# Patient Record
Sex: Female | Born: 1965 | Race: White | Hispanic: No | Marital: Single | Smoking: Never smoker
Health system: Southern US, Community
[De-identification: ages and names within clinical notes are randomized; demographics above are authoritative.]

## PROBLEM LIST (undated history)

## (undated) DIAGNOSIS — F329 Major depressive disorder, single episode, unspecified: Secondary | ICD-10-CM

## (undated) DIAGNOSIS — I10 Essential (primary) hypertension: Secondary | ICD-10-CM

## (undated) DIAGNOSIS — D696 Thrombocytopenia, unspecified: Secondary | ICD-10-CM

## (undated) DIAGNOSIS — F32A Depression, unspecified: Secondary | ICD-10-CM

## (undated) DIAGNOSIS — F419 Anxiety disorder, unspecified: Secondary | ICD-10-CM

## (undated) DIAGNOSIS — M1990 Unspecified osteoarthritis, unspecified site: Secondary | ICD-10-CM

## (undated) DIAGNOSIS — Z5189 Encounter for other specified aftercare: Secondary | ICD-10-CM

## (undated) HISTORY — PX: WRIST SURGERY: SHX841

## (undated) HISTORY — PX: ANKLE SURGERY: SHX546

## (undated) HISTORY — DX: Encounter for other specified aftercare: Z51.89

## (undated) HISTORY — DX: Essential (primary) hypertension: I10

## (undated) HISTORY — DX: Major depressive disorder, single episode, unspecified: F32.9

## (undated) HISTORY — PX: KNEE ARTHROSCOPY: SUR90

## (undated) HISTORY — DX: Depression, unspecified: F32.A

## (undated) HISTORY — PX: PITUITARY SURGERY: SHX203

## (undated) HISTORY — DX: Thrombocytopenia, unspecified: D69.6

## (undated) HISTORY — PX: TONSILLECTOMY: SUR1361

## (undated) HISTORY — DX: Unspecified osteoarthritis, unspecified site: M19.90

## (undated) HISTORY — PX: OVARY SURGERY: SHX727

## (undated) HISTORY — PX: OTHER SURGICAL HISTORY: SHX169

## (undated) HISTORY — DX: Anxiety disorder, unspecified: F41.9

---

## 1997-07-13 ENCOUNTER — Other Ambulatory Visit: Admission: RE | Admit: 1997-07-13 | Discharge: 1997-07-13 | Payer: Self-pay | Admitting: Obstetrics and Gynecology

## 1998-01-11 ENCOUNTER — Other Ambulatory Visit: Admission: RE | Admit: 1998-01-11 | Discharge: 1998-01-11 | Payer: Self-pay | Admitting: Obstetrics and Gynecology

## 2000-09-21 ENCOUNTER — Ambulatory Visit (HOSPITAL_COMMUNITY): Admission: RE | Admit: 2000-09-21 | Discharge: 2000-09-21 | Payer: Self-pay | Admitting: Endocrinology

## 2000-09-21 ENCOUNTER — Encounter: Payer: Self-pay | Admitting: Endocrinology

## 2001-09-12 ENCOUNTER — Encounter: Payer: Self-pay | Admitting: Endocrinology

## 2001-09-12 ENCOUNTER — Ambulatory Visit (HOSPITAL_COMMUNITY): Admission: RE | Admit: 2001-09-12 | Discharge: 2001-09-12 | Payer: Self-pay | Admitting: Endocrinology

## 2001-11-17 ENCOUNTER — Inpatient Hospital Stay (HOSPITAL_COMMUNITY): Admission: RE | Admit: 2001-11-17 | Discharge: 2001-11-23 | Payer: Self-pay | Admitting: Neurosurgery

## 2001-11-17 ENCOUNTER — Encounter: Payer: Self-pay | Admitting: Neurosurgery

## 2010-07-26 ENCOUNTER — Ambulatory Visit (HOSPITAL_BASED_OUTPATIENT_CLINIC_OR_DEPARTMENT_OTHER)
Admission: RE | Admit: 2010-07-26 | Discharge: 2010-07-26 | Disposition: A | Discharge status: Home / Self Care | Payer: Medicare Other | Source: Ambulatory Visit | Attending: Orthopedic Surgery | Admitting: Orthopedic Surgery

## 2010-07-26 DIAGNOSIS — M1710 Unilateral primary osteoarthritis, unspecified knee: Secondary | ICD-10-CM | POA: Insufficient documentation

## 2010-07-26 DIAGNOSIS — Z01810 Encounter for preprocedural cardiovascular examination: Secondary | ICD-10-CM | POA: Insufficient documentation

## 2010-07-26 DIAGNOSIS — IMO0002 Reserved for concepts with insufficient information to code with codable children: Secondary | ICD-10-CM | POA: Insufficient documentation

## 2010-07-26 DIAGNOSIS — M23302 Other meniscus derangements, unspecified lateral meniscus, unspecified knee: Secondary | ICD-10-CM | POA: Insufficient documentation

## 2010-07-26 DIAGNOSIS — X58XXXA Exposure to other specified factors, initial encounter: Secondary | ICD-10-CM | POA: Insufficient documentation

## 2010-07-26 DIAGNOSIS — M23305 Other meniscus derangements, unspecified medial meniscus, unspecified knee: Secondary | ICD-10-CM | POA: Insufficient documentation

## 2010-07-26 DIAGNOSIS — M2350 Chronic instability of knee, unspecified knee: Secondary | ICD-10-CM | POA: Insufficient documentation

## 2010-07-26 DIAGNOSIS — M2240 Chondromalacia patellae, unspecified knee: Secondary | ICD-10-CM | POA: Insufficient documentation

## 2010-07-26 DIAGNOSIS — S83289A Other tear of lateral meniscus, current injury, unspecified knee, initial encounter: Secondary | ICD-10-CM | POA: Insufficient documentation

## 2010-07-26 DIAGNOSIS — Z01812 Encounter for preprocedural laboratory examination: Secondary | ICD-10-CM | POA: Insufficient documentation

## 2010-07-26 LAB — POCT I-STAT, CHEM 8
BUN: 18 mg/dL (ref 6–23)
Calcium, Ion: 1.11 mmol/L — ABNORMAL LOW (ref 1.12–1.32)
Chloride: 105 mEq/L (ref 96–112)
Creatinine, Ser: 0.7 mg/dL (ref 0.4–1.2)
Glucose, Bld: 153 mg/dL — ABNORMAL HIGH (ref 70–99)
HCT: 47 % — ABNORMAL HIGH (ref 36.0–46.0)
Hemoglobin: 16 g/dL — ABNORMAL HIGH (ref 12.0–15.0)
Potassium: 4.1 mEq/L (ref 3.5–5.1)
Sodium: 137 mEq/L (ref 135–145)
TCO2: 23 mmol/L (ref 0–100)

## 2010-07-30 LAB — GLUCOSE, CAPILLARY: Glucose-Capillary: 131 mg/dL — ABNORMAL HIGH (ref 70–99)

## 2010-08-07 NOTE — Op Note (Signed)
Jessica Cline, Jessica Cline               ACCOUNT NO.:  [id number]  MEDICAL RECORD NO.:  [id number]            PATIENT TYPE:  LOCATION:                                 FACILITY:  PHYSICIAN:  Marlowe Kays, M.D.       DATE OF BIRTH:  DATE OF PROCEDURE:  07/26/2010 DATE OF DISCHARGE:                              OPERATIVE REPORT   PREOPERATIVE DIAGNOSES: 1. Torn medial and lateral menisci. 2. Chronic anterior cruciate ligament tear. 3. Osteoarthritis, left knee.  POSTOPERATIVE DIAGNOSES: 1. Torn medial and lateral menisci. 2. Chronic anterior cruciate ligament tear. 3. Osteoarthritis, left knee.  OPERATION:  Left knee arthroscopy with: 1. Partial medial and lateral meniscectomy. 2. Shaving of medial femoral condyle and lateral tibial plateau.  SURGEON:  Marlowe Kays, M.D.  ASSISTANT:  Nurse.  ANESTHESIA.:  General.  PLAN AND JUSTIFICATION FOR PROCEDURE:  She had a twisting injury to her left knee several years ago with proximal fibular fracture.  Because of continued pain, popping, weakness and instability, she had an MRI performed on July 12, 2010, which showed the preoperative diagnoses. She has tricompartmental arthritis and at her age with this wear, my advise was that it was not practical to do an ACL reconstruction, indeed she does not want to have go through the rehab necessary.  Consequently, we are here just to take care of the non-ACL problems today.  PROCEDURE:  Under satisfied general anesthesia, pneumatic tourniquet to left lower extremity and leg support to right lower extremity.  The left leg was Esmarch out nonsterilely and tourniquet inflated to 350 mmHg.  I then applied the lateral thigh stabilizer to the left leg and draped her left leg from stabilizer to ankle with DuraPrep, draped in sterile field.  Time-out performed.  Superomedial saline inflow. First through an anterolateral portal, medial compartment of the knee joint was evaluated with the  findings noted above.  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  With some small baskets, I resected the posterior meniscus back to stable rim and shaved it down also with 3.5 shaver as I did the medial femoral condyle.  Looking at the medial gutter and suprapatellar area, there was no significant wear of her patella.  On reversing portals, the complete ACL tear was identified.  There was nothing into the joint that needed debridement. Her lateral meniscus was badly macerated and torn throughout its entire anterior two-thirds.  I trimmed back the meniscus with a combination of arthroscopic scissors, a 3.5 shaver, and upbiting baskets.  I also shaved down the lateral tibial plateau which had corrugated appearance as much as possible.  When I completed this portion of the procedure, I irrigated the wound well with sterile saline and removed all fluid possible.  I closed the 2 entry portals with 4-0 nylon and injected through the inflow apparatus 20 cc of 0.5% Marcaine with adrenaline, 4 mg of morphine and then closed this inflow with 4-0 nylon as well.  Betadine, Adaptic, dry sterile dressing were applied.  She tolerated the procedure well, was taken to the recovery room in satisfactory condition with no known complications.          ______________________________ Marlowe Kays, M.D.     JA/MEDQ  D:  07/26/2010  T:  07/26/2010  Job:  [id number]  Electronically Signed by Marlowe Kays M.D. on 08/07/2010 04:19:45 PM

## 2010-11-21 ENCOUNTER — Other Ambulatory Visit: Payer: Self-pay | Admitting: Surgery

## 2014-04-10 ENCOUNTER — Ambulatory Visit: Payer: Self-pay | Admitting: Hematology and Oncology

## 2014-04-10 LAB — CBC CANCER CENTER
Bands: 1 %
Basophil #: 0.1 x10 3/mm (ref 0.0–0.1)
Basophil %: 2.1 %
Eosinophil #: 0.1 x10 3/mm (ref 0.0–0.7)
Eosinophil %: 2.9 %
Eosinophil: 4 %
HCT: 44.3 % (ref 35.0–47.0)
HGB: 14.6 g/dL (ref 12.0–16.0)
Lymphocyte #: 1.2 x10 3/mm (ref 1.0–3.6)
Lymphocyte %: 28.5 %
Lymphocytes: 39 %
MCH: 27.8 pg (ref 26.0–34.0)
MCHC: 32.9 g/dL (ref 32.0–36.0)
MCV: 85 fL (ref 80–100)
Monocyte #: 0.3 x10 3/mm (ref 0.2–0.9)
Monocyte %: 7.3 %
Monocytes: 5 %
Neutrophil #: 2.5 x10 3/mm (ref 1.4–6.5)
Neutrophil %: 59.2 %
Platelet: 46 x10 3/mm — ABNORMAL LOW (ref 150–440)
RBC: 5.24 10*6/uL — ABNORMAL HIGH (ref 3.80–5.20)
RDW: 15.9 % — ABNORMAL HIGH (ref 11.5–14.5)
Segmented Neutrophils: 51 %
WBC: 4.3 x10 3/mm (ref 3.6–11.0)

## 2014-04-10 LAB — IRON AND TIBC
Iron Bind.Cap.(Total): 346 ug/dL (ref 250–450)
Iron Saturation: 36 %
Iron: 124 ug/dL (ref 50–170)
Unbound Iron-Bind.Cap.: 222 ug/dL

## 2014-04-10 LAB — APTT: Activated PTT: 45.3 secs — ABNORMAL HIGH (ref 23.6–35.9)

## 2014-04-10 LAB — FOLATE: Folic Acid: 15.3 ng/mL (ref 3.1–17.5)

## 2014-04-10 LAB — PROTIME-INR
INR: 1.1
Prothrombin Time: 13.9 secs (ref 11.5–14.7)

## 2014-04-10 LAB — FIBRINOGEN: Fibrinogen: 207 mg/dL — ABNORMAL LOW (ref 210–470)

## 2014-04-10 LAB — FERRITIN: Ferritin (ARMC): 290 ng/mL (ref 8–388)

## 2014-04-11 LAB — PROT IMMUNOELECTROPHORES(ARMC)

## 2014-04-19 LAB — URINE IEP, RANDOM

## 2014-04-20 LAB — CBC CANCER CENTER
Basophil #: 0.1 x10 3/mm (ref 0.0–0.1)
Basophil %: 1.1 %
Eosinophil #: 0.1 x10 3/mm (ref 0.0–0.7)
Eosinophil %: 2.6 %
HCT: 45.1 % (ref 35.0–47.0)
HGB: 15 g/dL (ref 12.0–16.0)
Lymphocyte #: 1.1 x10 3/mm (ref 1.0–3.6)
Lymphocyte %: 25.1 %
MCH: 27.3 pg (ref 26.0–34.0)
MCHC: 33.2 g/dL (ref 32.0–36.0)
MCV: 82 fL (ref 80–100)
Monocyte #: 0.3 x10 3/mm (ref 0.2–0.9)
Monocyte %: 5.7 %
Neutrophil #: 3 x10 3/mm (ref 1.4–6.5)
Neutrophil %: 65.5 %
Platelet: 49 x10 3/mm — ABNORMAL LOW (ref 150–440)
RBC: 5.48 10*6/uL — ABNORMAL HIGH (ref 3.80–5.20)
RDW: 15.7 % — ABNORMAL HIGH (ref 11.5–14.5)
WBC: 4.5 x10 3/mm (ref 3.6–11.0)

## 2014-04-20 LAB — LACTATE DEHYDROGENASE: LDH: 149 U/L (ref 81–246)

## 2014-04-20 LAB — URIC ACID: Uric Acid: 4 mg/dL (ref 2.6–6.0)

## 2014-04-24 ENCOUNTER — Ambulatory Visit: Payer: Self-pay | Admitting: Hematology and Oncology

## 2014-05-23 ENCOUNTER — Ambulatory Visit
Admit: 2014-05-23 | Disposition: A | Discharge status: Home / Self Care | Payer: Self-pay | Attending: Hematology and Oncology | Admitting: Hematology and Oncology

## 2014-06-01 DIAGNOSIS — K769 Liver disease, unspecified: Secondary | ICD-10-CM | POA: Insufficient documentation

## 2014-08-07 ENCOUNTER — Other Ambulatory Visit: Payer: Self-pay | Admitting: Gastroenterology

## 2014-08-07 DIAGNOSIS — K7469 Other cirrhosis of liver: Secondary | ICD-10-CM

## 2014-08-10 ENCOUNTER — Other Ambulatory Visit: Payer: Medicare Other

## 2015-01-05 DIAGNOSIS — D696 Thrombocytopenia, unspecified: Secondary | ICD-10-CM | POA: Insufficient documentation

## 2015-01-05 DIAGNOSIS — K7031 Alcoholic cirrhosis of liver with ascites: Secondary | ICD-10-CM | POA: Insufficient documentation

## 2015-01-05 DIAGNOSIS — K7030 Alcoholic cirrhosis of liver without ascites: Secondary | ICD-10-CM | POA: Insufficient documentation

## 2015-01-05 DIAGNOSIS — N939 Abnormal uterine and vaginal bleeding, unspecified: Secondary | ICD-10-CM | POA: Insufficient documentation

## 2015-01-05 DIAGNOSIS — E237 Disorder of pituitary gland, unspecified: Secondary | ICD-10-CM | POA: Insufficient documentation

## 2015-01-06 DIAGNOSIS — T8089XA Other complications following infusion, transfusion and therapeutic injection, initial encounter: Secondary | ICD-10-CM | POA: Insufficient documentation

## 2015-01-24 ENCOUNTER — Other Ambulatory Visit: Payer: Self-pay | Admitting: Hematology and Oncology

## 2015-01-24 ENCOUNTER — Telehealth: Payer: Self-pay | Admitting: *Deleted

## 2015-01-24 DIAGNOSIS — R760 Raised antibody titer: Secondary | ICD-10-CM

## 2015-01-24 DIAGNOSIS — D696 Thrombocytopenia, unspecified: Secondary | ICD-10-CM

## 2015-01-24 NOTE — Telephone Encounter (Signed)
Reports that she had severe bleeding episode 3 weeks ago requiring her to get 3 units of blood, and was diagnosed with something going on with her red blood cells. Asking if she should come back to see Dr Merlene Pullingorcoran before she needs another transfusion Plan from 04/2014 note, orders never entered and appt never scheduled for fu Plan:   1)  Review work-up with patient.  Discuss lupus anticoagulant.  Discuss confirmation testing in 3 months (07/20/2014).  Discuss issues regarding potential clotting and bleeding.  Discuss continued observation as platelet count stable. 2)  Contact Dr. Dulce Sellarutlaw regarding possible cirrhosis with nodular liver and splenomegaly.   3)  Labs today: CBC with diff,  4)  Patient to contact clinic if any bruising or bleeding.  Avoid aspirin and ibuprofen. 5)  RTC in 2 weeks for CBC with diff. 6)  Return to clinic in 1 month for MD assessment and labs (CBC).

## 2015-01-24 NOTE — Telephone Encounter (Signed)
Appt agreed on to be here at 10 for lab 1030 for md, need lab orders entered

## 2015-01-24 NOTE — Telephone Encounter (Signed)
Please have pt come in for Labs and MD.  If you can put her on in the morning.  Thank you,   Gearldine BienenstockBrandy

## 2015-01-25 ENCOUNTER — Encounter: Payer: Self-pay | Admitting: Hematology and Oncology

## 2015-01-25 ENCOUNTER — Inpatient Hospital Stay (HOSPITAL_BASED_OUTPATIENT_CLINIC_OR_DEPARTMENT_OTHER): Payer: Medicare Other | Admitting: Hematology and Oncology

## 2015-01-25 ENCOUNTER — Inpatient Hospital Stay: Payer: Medicare Other | Attending: Hematology and Oncology

## 2015-01-25 VITALS — BP 129/82 | HR 76 | Temp 95.1°F | Resp 18 | Wt 230.8 lb

## 2015-01-25 DIAGNOSIS — K7460 Unspecified cirrhosis of liver: Secondary | ICD-10-CM

## 2015-01-25 DIAGNOSIS — Z8701 Personal history of pneumonia (recurrent): Secondary | ICD-10-CM | POA: Diagnosis not present

## 2015-01-25 DIAGNOSIS — R760 Raised antibody titer: Secondary | ICD-10-CM

## 2015-01-25 DIAGNOSIS — K769 Liver disease, unspecified: Secondary | ICD-10-CM

## 2015-01-25 DIAGNOSIS — Z79899 Other long term (current) drug therapy: Secondary | ICD-10-CM

## 2015-01-25 DIAGNOSIS — N939 Abnormal uterine and vaginal bleeding, unspecified: Secondary | ICD-10-CM | POA: Diagnosis not present

## 2015-01-25 DIAGNOSIS — D696 Thrombocytopenia, unspecified: Secondary | ICD-10-CM

## 2015-01-25 LAB — COMPREHENSIVE METABOLIC PANEL
ALT: 36 U/L (ref 14–54)
AST: 47 U/L — ABNORMAL HIGH (ref 15–41)
Albumin: 4.2 g/dL (ref 3.5–5.0)
Alkaline Phosphatase: 116 U/L (ref 38–126)
Anion gap: 7 (ref 5–15)
BUN: 14 mg/dL (ref 6–20)
CO2: 24 mmol/L (ref 22–32)
Calcium: 8.4 mg/dL — ABNORMAL LOW (ref 8.9–10.3)
Chloride: 104 mmol/L (ref 101–111)
Creatinine, Ser: 0.84 mg/dL (ref 0.44–1.00)
GFR calc Af Amer: >60 mL/min (ref >60)
GFR calc non Af Amer: >60 mL/min (ref >60)
Glucose, Bld: 129 mg/dL — ABNORMAL HIGH (ref 65–99)
Potassium: 3.5 mmol/L (ref 3.5–5.1)
Sodium: 135 mmol/L (ref 135–145)
Total Bilirubin: 1.5 mg/dL — ABNORMAL HIGH (ref 0.3–1.2)
Total Protein: 7.2 g/dL (ref 6.5–8.1)

## 2015-01-25 LAB — CBC WITH DIFFERENTIAL/PLATELET
Basophils Absolute: 0 10*3/uL (ref 0–0.1)
Basophils Relative: 1 %
Eosinophils Absolute: 0.1 10*3/uL (ref 0–0.7)
Eosinophils Relative: 3 %
HCT: 39 % (ref 35.0–47.0)
Hemoglobin: 13 g/dL (ref 12.0–16.0)
Lymphocytes Relative: 18 %
Lymphs Abs: 0.6 10*3/uL — ABNORMAL LOW (ref 1.0–3.6)
MCH: 28.5 pg (ref 26.0–34.0)
MCHC: 33.2 g/dL (ref 32.0–36.0)
MCV: 86 fL (ref 80.0–100.0)
Monocytes Absolute: 0.2 10*3/uL (ref 0.2–0.9)
Monocytes Relative: 7 %
Neutro Abs: 2.2 10*3/uL (ref 1.4–6.5)
Neutrophils Relative %: 71 %
Platelets: 39 10*3/uL — ABNORMAL LOW (ref 150–440)
RBC: 4.54 MIL/uL (ref 3.80–5.20)
RDW: 15.7 % — ABNORMAL HIGH (ref 11.5–14.5)
WBC: 3.1 10*3/uL — ABNORMAL LOW (ref 3.6–11.0)

## 2015-01-25 LAB — PROTIME-INR
INR: 1.04
Prothrombin Time: 13.8 seconds (ref 11.4–15.0)

## 2015-01-25 LAB — FIBRINOGEN: Fibrinogen: 200 mg/dL — ABNORMAL LOW (ref 210–470)

## 2015-01-25 NOTE — Progress Notes (Signed)
Reports that she had severe bleeding episode 3 weeks ago requiring her to get 3 units of blood, and was diagnosed with something going on with her red blood cells. Wanted to come back to see Dr Merlene Pullingorcoran before she needs another bllod transfusion.

## 2015-01-26 ENCOUNTER — Telehealth: Payer: Self-pay

## 2015-01-26 ENCOUNTER — Encounter: Payer: Self-pay | Admitting: Hematology and Oncology

## 2015-01-26 NOTE — Progress Notes (Signed)
[facility] day:  01/25/2015  Chief Complaint: Jessica Cline is a 49 y.o. female with thrombocytopenia who is seen for reassessment after interval vaginal hemorrhage.  HPI: The patient was last seen in the medical oncology clinic on 05/11/2014.  At that time, she was seen for assessment after interval GI evaluation. Seen Dr. Paulita Fujita. EGD was "okay". She states that she receive the hepatitis BE vaccine series are. She is unaware of any workup for her cirrhosis. She states that she is trying to eat a healthy diet. She has been lost to follow-up.  The patient was seen on 06/01/2014 at [facility]. She presented with right-sided rib pain and a productive cough. Chest CT revealed extensive right lower lobe pneumonia. There was evidence of cirrhosis as well as an abnormal attenuation in the right lobe of the liver for which more definitive characterization by hepatic MRI was suggested to exclude malignancy. She received a course of Levaquin.  She presented to [facility] emergency room with acute vaginal bleeding on 01/05/2015. She became hypotensive requiring resuscitation with 3 L of normal saline.  Because of extensive hemorrhage, she was transferred to [facility]. Hematocrit dropped from 39 to 30 in an hour and a half. She received 3 units of packed red blood cells. She received 1 unit of pheresed platelets, but had a severe allergic reaction. It was recommended that 45 minutes prior to platelet transfusions in the future she would receive Benadryl +/- steroids.  She underwent transvaginal ultrasound. The endometrium was ill-defined and not well visualized measuring approximate 5-10 millimeters.  There was a heterogeneous uterus without focal myometrial mass. There was mild-to-moderate pelvic free fluid.  She underwent laboratory testing. Platelet function assay to ADP was 109 seconds (60-107 seconds) and epinephrine greater than 300 (84-178).  She had taken an aspirin in the past  week. Factor VIII level was 254 (54-161).  Von Willebrand factor antigen was 420 and von Willebrand factor activity greater than 400. There is no evidence of von Willebrand's disease. IgA was 153.2. PT was 13.1 with an INR 1.18. PTT was 28.3. Fibrinogen was 158 (208-409).  Haptoglobin was 25. LDH 438. D-dimer 706. Albumin 3.2. Bilirubin 2.7, AST 45 and ALT of 49. ATIII activity was 59%. Platelets were initially 66,000 but dropped to 48,000. Coombs was negative. Hepatitis serologies were negative. Creatinine was 0.81.  She was seen in consultation by hematology/oncology. 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 Her underlying liver disease was felt to put her at risk for hypofibrinogenemia or dysfibrinogenemia.  Platelet function test was consistent with an aspirin effective. It was recommended that she undergo MRI of the abdomen to assess the liver previously noted abnormal in 05/2014.  It was recommended that the patient be admitted to the hospital. The patient declined and a left AMA. She has had no further bleeding.  Past Medical History  Diagnosis Date  . Blood transfusion without reported diagnosis   . Arthritis     No past surgical history on file.  Family History  Problem Relation Age of Onset  . Cancer Father   . Cancer Maternal Grandmother     Social History:  has no tobacco, alcohol, and drug history on file.  Patient's contact number is [phone]. The patient is alone today.  Allergies: No Known Allergies  Current Medications: Current Outpatient Prescriptions  Medication Sig Dispense Refill  . amLODipine (NORVASC) 10 MG tablet     .  celecoxib (CELEBREX) 100 MG capsule     . clonazePAM (KLONOPIN) 1 MG tablet     . desmopressin (DDAVP) 0.01 % SOLN     . QUEtiapine (SEROQUEL) 100 MG tablet     . sertraline (ZOLOFT) 100 MG tablet     . tiZANidine (ZANAFLEX) 4 MG tablet Take 4 mg by mouth.    .  traZODone (DESYREL) 100 MG tablet     . TWINRIX 720-20 injection      No current facility-administered medications for this visit.    Review of Systems:  GENERAL:  Feels good.  Active.  No fevers, sweats or weight loss. PERFORMANCE STATUS (ECOG):  1 HEENT:  No visual changes, runny nose, sore throat, mouth sores or tenderness. Lungs: No shortness of breath or cough.  No hemoptysis. Cardiac:  No chest pain, palpitations, orthopnea, or PND. GI:  No nausea, vomiting, diarrhea, constipation, melena or hematochezia. GU:  No urgency, frequency, dysuria, or hematuria.  No further vaginal bleeding. Musculoskeletal:  No back pain.  No joint pain.  No muscle tenderness. Extremities:  No pain or swelling. Skin:  No rashes or skin changes. Neuro:  No headache, numbness or weakness, balance or coordination issues. Endocrine:  No diabetes, thyroid issues, hot flashes or night sweats. Psych:  No mood changes, depression or anxiety. Pain:  No focal pain. Review of systems:  All other systems reviewed and found to be negative.  Physical Exam: Blood pressure 129/82, pulse 76, temperature 95.1 F (35.1 C), temperature source Tympanic, resp. rate 18, weight 230 lb 13.2 oz (104.7 kg). GENERAL:  Well developed, well nourished, sitting comfortably in the exam room in no acute distress. MENTAL STATUS:  Alert and oriented to person, place and time. HEAD:  Short wavy gray hair.  Normocephalic, atraumatic, face symmetric, no Cushingoid features. EYES:  Glasses propped on head.  Blue eyes.  Pupils equal round and reactive to light and accomodation.  No conjunctivitis or scleral icterus. ENT:  Oropharynx clear without lesion.  Tongue normal. Mucous membranes moist.  RESPIRATORY:  Clear to auscultation without rales, wheezes or rhonchi. CARDIOVASCULAR:  Regular rate and rhythm without murmur, rub or gallop. ABDOMEN:  Soft, non-tender, with active bowel sounds, and no hepatomegaly.  Spleen tip palpable.  No  masses. SKIN:  No rashes, ulcers or lesions.  No petechiae or ecchymosis. EXTREMITIES: No edema, no skin discoloration or tenderness.  No palpable cords. LYMPH NODES: No palpable cervical, supraclavicular, axillary or inguinal adenopathy  NEUROLOGICAL: Unremarkable. PSYCH:  Appropriate.  Appointment on 01/25/2015  Component Date Value Ref Range Status  . WBC 01/25/2015 3.1* 3.6 - 11.0 K/uL Final  . RBC 01/25/2015 4.54  3.80 - 5.20 MIL/uL Final  . Hemoglobin 01/25/2015 13.0  12.0 - 16.0 g/dL Final  . HCT 01/25/2015 39.0  35.0 - 47.0 % Final  . MCV 01/25/2015 86.0  80.0 - 100.0 fL Final  . MCH 01/25/2015 28.5  26.0 - 34.0 pg Final  . MCHC 01/25/2015 33.2  32.0 - 36.0 g/dL Final  . RDW 01/25/2015 15.7* 11.5 - 14.5 % Final  . Platelets 01/25/2015 39* 150 - 440 K/uL Final  . Neutrophils Relative % 01/25/2015 71   Final  . Neutro Abs 01/25/2015 2.2  1.4 - 6.5 K/uL Final  . Lymphocytes Relative 01/25/2015 18   Final  . Lymphs Abs 01/25/2015 0.6* 1.0 - 3.6 K/uL Final  . Monocytes Relative 01/25/2015 7   Final  . Monocytes Absolute 01/25/2015 0.2  0.2 - 0.9 K/uL Final  .  Eosinophils Relative 01/25/2015 3   Final  . Eosinophils Absolute 01/25/2015 0.1  0 - 0.7 K/uL Final  . Basophils Relative 01/25/2015 1   Final  . Basophils Absolute 01/25/2015 0.0  0 - 0.1 K/uL Final  . Sodium 01/25/2015 135  135 - 145 mmol/L Final  . Potassium 01/25/2015 3.5  3.5 - 5.1 mmol/L Final  . Chloride 01/25/2015 104  101 - 111 mmol/L Final  . CO2 01/25/2015 24  22 - 32 mmol/L Final  . Glucose, Bld 01/25/2015 129* 65 - 99 mg/dL Final  . BUN 01/25/2015 14  6 - 20 mg/dL Final  . Creatinine, Ser 01/25/2015 0.84  0.44 - 1.00 mg/dL Final  . Calcium 01/25/2015 8.4* 8.9 - 10.3 mg/dL Final  . Total Protein 01/25/2015 7.2  6.5 - 8.1 g/dL Final  . Albumin 01/25/2015 4.2  3.5 - 5.0 g/dL Final  . AST 01/25/2015 47* 15 - 41 U/L Final  . ALT 01/25/2015 36  14 - 54 U/L Final  . Alkaline Phosphatase 01/25/2015 116  38 - 126  U/L Final  . Total Bilirubin 01/25/2015 1.5* 0.3 - 1.2 mg/dL Final  . GFR calc non Af Amer 01/25/2015 >60  >60 mL/min Final  . GFR calc Af Amer 01/25/2015 >60  >60 mL/min Final   Comment: (NOTE) The eGFR has been calculated using the CKD EPI equation. This calculation has not been validated in all clinical situations. eGFR's persistently <60 mL/min signify possible Chronic Kidney Disease.   . Anion gap 01/25/2015 7  5 - 15 Final  . Prothrombin Time 01/25/2015 13.8  11.4 - 15.0 seconds Final  . INR 01/25/2015 1.04   Final  . Fibrinogen 01/25/2015 200* 210 - 470 mg/dL Final    Assessment:  Jessica Cline is a 49 y.o. female with cirrhosis and associated moderate thrombocytopenia and a lupus anticoagulant.  Approximately 2 months prior to her abnormal lab results, she had a URI/bronchitis treated with azithromycin.  Platelet count was 36,000 on 04/05/2014, 46,000 on 04/10/2014, and 49,000 on 04/20/2014.  She denies any new medications or herbal products.  She denies any B symptoms, bruising or bleeding.  Exam is notable for no adenopathy or appreciable hepatosplenomegaly.  Work-up on 04/10/2014 revealed a platelet count in a blue top tube was 46,000 (rule out pseudothrombocytopenia).  Negative labs included an ANA, ferritin, iron studies, B12, folate, SPEP (apparent polyclonal gammopathy), UPEP, hepatitis B surface antigen, hepatitis C RNA, and HIV testing.  Fibrinogen was 207 (210-470).  PTT was elevated at 45.3 (23.6-35.9) suggestive of a possible factor deficiency or inhibitor.  Additional testing on 04/20/2014 confirmed a lupus anticoagulant.  Abdominal ultrasound on 04/18/2014 revealed a prominent left hepatic lobe with mild nodularity and splenomegaly (16.9 cm) suggestive of possible cirrhosis.  Patient denies any alcohol use (last 10 years ago).  EGD in 03/2014 was normal per patient's report.  She received the hepatitis B vaccine series.  Chest CT on 06/01/2014 revealed an abnormal  attenuation in the right lobe of the liver for which more definitive characterization by hepatic MRI was suggested to exclude malignancy.   She presented with a vaginal hemorrhage on 01/05/2015.  She required 3 units of PRBCs and 1 unit pheresed platelets.  She had an severe allergic reaction to platelets.  She will require premedications of Benadryl +/- steroids.  PT was 13.1 (INR 1.18) and PTT was 28.3. Fibrinogen was 158 (208-409).   Von Willebrand testing was negative.  Platelet function assay was c/w aspirin effect.  Symptomatically,  she is back to baseline.  She denies any bruising or bleeding.  Plan: 1. Review interim events.  Discuss obtaining [facility]  Discuss need to follow-up with GI regarding her liver and gynecology regarding her recent vaginal bleeding after 2 years of no menses. 2. Labs today:  CBC with diff, CMP, lupus anticoagulant, anticardiolipin antibodies, PT/INR, vonWillebrand panel. 3. Anticipate liver MRI to ensure no evidence of HCC given imaging in 05/2014 (discovered after records from [facility] reviewed). 4. RTC in 1 week for MD assess and review of work-up.   Lequita Asal, MD  01/25/2015

## 2015-01-26 NOTE — Telephone Encounter (Signed)
Called patient and informed her that calcium is low and Dr. Merlene Pullingorcoran advises that she take Calcium 500mg  BID. Patient gave verbal understanding to this.

## 2015-01-27 LAB — LUPUS ANTICOAGULANT PANEL
DRVVT: 46.3 s — ABNORMAL HIGH (ref 0.0–44.0)
PTT Lupus Anticoagulant: 54.8 s — ABNORMAL HIGH (ref 0.0–40.6)

## 2015-01-27 LAB — CARDIOLIPIN ANTIBODIES, IGG, IGM, IGA
Anticardiolipin IgA: <9 APL U/mL (ref 0–11)
Anticardiolipin IgG: <9 GPL U/mL (ref 0–14)
Anticardiolipin IgM: <9 MPL U/mL (ref 0–12)

## 2015-01-27 LAB — COAG STUDIES INTERP REPORT: PDF Image: 0

## 2015-01-27 LAB — DRVVT MIX: dRVVT Mix: 55.8 s — ABNORMAL HIGH (ref 0.0–44.0)

## 2015-01-27 LAB — VON WILLEBRAND PANEL
Coagulation Factor VIII: 136 % (ref 57–163)
Ristocetin Co-factor, Plasma: 235 % — ABNORMAL HIGH (ref 50–200)
Von Willebrand Antigen, Plasma: 281 % — ABNORMAL HIGH (ref 50–200)

## 2015-01-27 LAB — DRVVT CONFIRM: dRVVT Confirm: 0.8 ratio (ref 0.8–1.2)

## 2015-01-27 LAB — PTT-LA MIX: PTT-LA Mix: 56 s — ABNORMAL HIGH (ref 0.0–40.6)

## 2015-01-27 LAB — HEXAGONAL PHASE PHOSPHOLIPID: Hexagonal Phase Phospholipid: 56 s — ABNORMAL HIGH (ref 0–11)

## 2015-02-01 ENCOUNTER — Inpatient Hospital Stay (HOSPITAL_BASED_OUTPATIENT_CLINIC_OR_DEPARTMENT_OTHER): Payer: Medicare Other | Admitting: Hematology and Oncology

## 2015-02-01 VITALS — BP 139/83 | HR 66 | Temp 98.0°F | Ht 66.0 in | Wt 228.2 lb

## 2015-02-01 DIAGNOSIS — R760 Raised antibody titer: Secondary | ICD-10-CM

## 2015-02-01 DIAGNOSIS — K7460 Unspecified cirrhosis of liver: Secondary | ICD-10-CM | POA: Diagnosis not present

## 2015-02-01 DIAGNOSIS — D696 Thrombocytopenia, unspecified: Secondary | ICD-10-CM

## 2015-02-01 DIAGNOSIS — N939 Abnormal uterine and vaginal bleeding, unspecified: Secondary | ICD-10-CM | POA: Diagnosis not present

## 2015-02-01 DIAGNOSIS — Z8701 Personal history of pneumonia (recurrent): Secondary | ICD-10-CM

## 2015-02-01 DIAGNOSIS — Z79899 Other long term (current) drug therapy: Secondary | ICD-10-CM

## 2015-02-01 DIAGNOSIS — K769 Liver disease, unspecified: Secondary | ICD-10-CM

## 2015-02-01 NOTE — Progress Notes (Signed)
[place] [facility] day:  02/01/2015   Chief Complaint: Jessica Cline is a 49 y.o. female with thrombocytopenia who is seen for review of interval work-up and discussion regarding direction of therapy.  HPI: The patient was last seen in the medical oncology clinic on 01/25/2015.  At that time, she was seen for reassessment after an nterval 3 unit vaginal hemorrhage.  She had extensive testing at [facility].  She also underwent local testing.  Labs on 01/25/2015 confirmed a lupus anticoagulant.  Anti-cardiolipin antibodies were negative.  Von Willebrand testing was negative.  Bilirubin was 1.5.  PT was 13.8 with an INR of 1.04.  CBC included a hematocrit 39, hemoglobin 13, platelets 39,000, white count 3100 with an ANC of 2200 .  Fibrinogen was 200 (210-470).    During the interim, she has had no further bleeding.  She denies any symptoms.  Past Medical History  Diagnosis Date  . Blood transfusion without reported diagnosis   . Arthritis     No past surgical history on file.  Family History  Problem Relation Age of Onset  . Cancer Father   . Cancer Maternal Grandmother     Social History:  reports that she has never smoked. She does not have any smokeless tobacco history on file. Her alcohol and drug histories are not on file.  Patient's contact number is [phone]. The patient is alone today.  Allergies: No Known Allergies  Current Medications: Current Outpatient Prescriptions  Medication Sig Dispense Refill  . amLODipine (NORVASC) 10 MG tablet     . celecoxib (CELEBREX) 100 MG capsule     . clonazePAM (KLONOPIN) 1 MG tablet     . desmopressin (DDAVP) 0.01 % SOLN     . QUEtiapine (SEROQUEL) 100 MG tablet     . sertraline (ZOLOFT) 100 MG tablet     . traZODone (DESYREL) 100 MG tablet      No current facility-administered medications for this visit.    Review of Systems:  GENERAL:  Feels "ok".  No fevers, sweats or weight loss. PERFORMANCE  STATUS (ECOG):  1 HEENT:  No visual changes, runny nose, sore throat, mouth sores or tenderness. Lungs: No shortness of breath or cough.  No hemoptysis. Cardiac:  No chest pain, palpitations, orthopnea, or PND. GI:  No nausea, vomiting, diarrhea, constipation, melena or hematochezia. GU:  No urgency, frequency, dysuria, or hematuria.  No further vaginal bleeding. Musculoskeletal:  No back pain.  No joint pain.  No muscle tenderness. Extremities:  No pain or swelling. Skin:  No rashes or skin changes. Neuro:  No headache, numbness or weakness, balance or coordination issues. Endocrine:  No diabetes, thyroid issues, hot flashes or night sweats. Psych:  No mood changes, depression or anxiety. Pain:  No focal pain. Review of systems:  All other systems reviewed and found to be negative.  Physical Exam: Blood pressure 139/83, pulse 66, temperature 98 F (36.7 C), temperature source Oral, height $RemoveBefo'5\' 6"'ApfAHzsCQbH$  (1.676 m), weight 228 lb 2.8 oz (103.5 kg). GENERAL:  Well developed, well nourished, sitting comfortably in the exam room in no acute distress. MENTAL STATUS:  Alert and oriented to person, place and time. HEAD:  Short graying shoulder length hair.  Normocephalic, atraumatic, face symmetric, no Cushingoid features. EYES:  Blue eyes.  Pupils equal round and reactive to light and accomodation.  No conjunctivitis or scleral icterus. ENT:  Oropharynx clear without lesion.  Tongue normal. Mucous membranes moist.  RESPIRATORY:  Clear to  auscultation without rales, wheezes or rhonchi. CARDIOVASCULAR:  Regular rate and rhythm without murmur, rub or gallop. ABDOMEN:  Soft, non-tender, with active bowel sounds, and no hepatomegaly.  Spleen tip palpable.  No masses. SKIN:  No rashes, ulcers or lesions.  No petechiae or ecchymosis. EXTREMITIES: No edema, no skin discoloration or tenderness.  No palpable cords. LYMPH NODES: No palpable cervical, supraclavicular, axillary or inguinal adenopathy  NEUROLOGICAL:  Unremarkable. PSYCH:  Appropriate.  No visits with results within 3 Day(s) from this visit. Latest known visit with results is:  Clinical Support on 01/25/2015  Component Date Value Ref Range Status  . WBC 01/25/2015 3.1* 3.6 - 11.0 K/uL Final  . RBC 01/25/2015 4.54  3.80 - 5.20 MIL/uL Final  . Hemoglobin 01/25/2015 13.0  12.0 - 16.0 g/dL Final  . HCT 01/25/2015 39.0  35.0 - 47.0 % Final  . MCV 01/25/2015 86.0  80.0 - 100.0 fL Final  . MCH 01/25/2015 28.5  26.0 - 34.0 pg Final  . MCHC 01/25/2015 33.2  32.0 - 36.0 g/dL Final  . RDW 01/25/2015 15.7* 11.5 - 14.5 % Final  . Platelets 01/25/2015 39* 150 - 440 K/uL Final  . Neutrophils Relative % 01/25/2015 71   Final  . Neutro Abs 01/25/2015 2.2  1.4 - 6.5 K/uL Final  . Lymphocytes Relative 01/25/2015 18   Final  . Lymphs Abs 01/25/2015 0.6* 1.0 - 3.6 K/uL Final  . Monocytes Relative 01/25/2015 7   Final  . Monocytes Absolute 01/25/2015 0.2  0.2 - 0.9 K/uL Final  . Eosinophils Relative 01/25/2015 3   Final  . Eosinophils Absolute 01/25/2015 0.1  0 - 0.7 K/uL Final  . Basophils Relative 01/25/2015 1   Final  . Basophils Absolute 01/25/2015 0.0  0 - 0.1 K/uL Final  . Sodium 01/25/2015 135  135 - 145 mmol/L Final  . Potassium 01/25/2015 3.5  3.5 - 5.1 mmol/L Final  . Chloride 01/25/2015 104  101 - 111 mmol/L Final  . CO2 01/25/2015 24  22 - 32 mmol/L Final  . Glucose, Bld 01/25/2015 129* 65 - 99 mg/dL Final  . BUN 01/25/2015 14  6 - 20 mg/dL Final  . Creatinine, Ser 01/25/2015 0.84  0.44 - 1.00 mg/dL Final  . Calcium 01/25/2015 8.4* 8.9 - 10.3 mg/dL Final  . Total Protein 01/25/2015 7.2  6.5 - 8.1 g/dL Final  . Albumin 01/25/2015 4.2  3.5 - 5.0 g/dL Final  . AST 01/25/2015 47* 15 - 41 U/L Final  . ALT 01/25/2015 36  14 - 54 U/L Final  . Alkaline Phosphatase 01/25/2015 116  38 - 126 U/L Final  . Total Bilirubin 01/25/2015 1.5* 0.3 - 1.2 mg/dL Final  . GFR calc non Af Amer 01/25/2015 >60  >60 mL/min Final  . GFR calc Af Amer  01/25/2015 >60  >60 mL/min Final   Comment: (NOTE) The eGFR has been calculated using the CKD EPI equation. This calculation has not been validated in all clinical situations. eGFR's persistently <60 mL/min signify possible Chronic Kidney Disease.   . Anion gap 01/25/2015 7  5 - 15 Final  . PTT Lupus Anticoagulant 01/25/2015 54.8* 0.0 - 40.6 sec Final                 **Please note reference interval change**  . DRVVT 01/25/2015 46.3* 0.0 - 44.0 sec Final                 **Please note reference interval change**  . Lupus Anticoag  Interp 01/25/2015 Comment:   Corrected   Comment: (NOTE) Results are consistent with the presence of a lupus anticoagulant. NOTE: Only persistent lupus anticoagulants are thought to be of clinical significance. For this reason, repeat testing in 12 or more weeks after an initial positive result should be considered to confirm or refute the presence of a lupus anticoagulant, depending on clinical presentation. Performed At: [facility] [place], [place] [id number] Lindon Romp MD UG:[id number]   . Anticardiolipin IgG 01/25/2015 <9  0 - 14 GPL U/mL Final   Comment: (NOTE)                          Negative:              <15                          Indeterminate:     15 - 20                          Low-Med Positive: >20 - 80                          High Positive:         >80   . Anticardiolipin IgM 01/25/2015 <9  0 - 12 MPL U/mL Final   Comment: (NOTE)                          Negative:              <13                          Indeterminate:     13 - 20                          Low-Med Positive: >20 - 80                          High Positive:         >80   . Anticardiolipin IgA 01/25/2015 <9  0 - 11 APL U/mL Final   Comment: (NOTE)                          Negative:              <12                          Indeterminate:     12 - 20                          Low-Med Positive: >20 - 80                          High  Positive:         >80 Performed At: [facility] [place], [place] [id number] Lindon Romp MD ZP:[id number]   . Prothrombin Time 01/25/2015 13.8  11.4 - 15.0 seconds Final  . INR 01/25/2015 1.04   Final  . Coagulation Factor VIII 01/25/2015 136  57 - 163 %  Final                 **Please note reference interval change**  . Ristocetin Co-factor, Plasma 01/25/2015 235* 50 - 200 % Final   Comment: (NOTE)              **Please note reference interval change** Performed At: [facility] [place], [place] [id number] Lindon Romp MD [id number]   . Von Willebrand Antigen, Plasma 01/25/2015 281* 50 - 200 % Final                 **Please note reference interval change**  . Fibrinogen 01/25/2015 200* 210 - 470 mg/dL Final  . PTT-LA Mix 01/25/2015 56.0* 0.0 - 40.6 sec Final   Comment: (NOTE)              **Please note reference interval change** Performed At: [facility] [place], [place] [id number] Lindon Romp MD PY:[id number]   . Hexagonal Phase Phospholipid 01/25/2015 56* 0 - 11 sec Final   Comment: (NOTE)              **Please note reference interval change** Performed At: [facility] [place], [place] [id number] Lindon Romp MD [id number]   . dRVVT Mix 01/25/2015 55.8* 0.0 - 44.0 sec Final   Comment: (NOTE)              **Please note reference interval change** Performed At: [facility] [place], [place] [id number] Lindon Romp MD [id number]   . dRVVT Confirm 01/25/2015 0.8  0.8 - 1.2 ratio Final   Comment: (NOTE)              **Please note reference interval change** Performed At: [facility] [place], [place] [id number] Lindon Romp MD YC:[id number]   . Interpretation 01/25/2015 Note   Final   Comment: (NOTE) ------------------------------- COAGULATION: VON WILLEBRAND FACTOR ASSESSMENT CURRENT RESULTS  ASSESSMENT The VWF:Ag is elevated. The VWF:RCo is elevated. The FVIII is normal. VON WILLEBRAND FACTOR ASSESSMENT CURRENT RESULTS INTERPRETATION - These results are not consistent with a diagnosis of VWD according to the current NHLBI guideline. VON WILLEBRAND FACTOR ASSESSMENT - VWF and FVIII may elevate as compared to baseline in pregnancy, in samples drawn from patients (particularly children) who are visibly stressed at the time of phlebotomy, as acute phase reactants, or in response to certain drug therapies such as desmopressin and estrogen. Repeat testing may be necessary before excluding a diagnosis of VWD especially if the clinical suspicion is high for an underlying bleeding disorder. The setting for phlebotomy should be as calm as possible and patients should be encouraged to sit quietly prior to the blood draw. VON WILLEBRAND FACTOR ASSESSMENT DEFINITI                          ONS - VWD - von Willebrand disease; VWF - von Villebrand factor; VWF:Ag - VWF antigen; VWF:RCo - VWF ristocetin cofactor activity; FVIII - factor VIII activity. - MEDICAL DIRECTOR: For questions regarding panel interpretation, please contact Anselm Jungling) Nickolas Madrid, M.D. or Jake Bathe, M.D. at [facility] at [phone]. ------------------------------- DISCLAIMER These assessments and interpretations are provided as a convenience in support of the physician-patient relationship and are not intended to replace the physician's clinical judgment. They are derived from national guidelines in addition to other evidence and expert opinion. The clinician should consider this information within the  context of clinical opinion and the individual patient. SEE GUIDANCE FOR ANTIPHOSPHOLIPID SYNDROME ASSESSMENT:(1) Pengo V et al. J Thromb Haemost. 2009; 7(10):1737-1740. (2) Miyakis S et al. Philip Aspen Haemost. 2006;4(2):295-306. (3) Marlowe Sax DA et al. Blood. 2                           007;110(9): [id number]. SEE GUIDANCE FOR VON WILLEBRAND FACTOR ASSESSMENT: (1) The National Heart, Lung and Blood Institute. The Diagnosis, Evaluation and Management of von Willebrand Disease. Janeal Holmes, MD: [place] Publication 20-9470. 9628. Available at vSpecials.com.pt. (2) Daryl Eastern et al. Carmin Muskrat J Hematol. 2009; 84(6):366-370. (3) [place]. 2004;10(3):199-217. (4) Pasi KJ et al. Haemophilia. 2004; 10(3):218-231.   Marland Kitchen PDF Image 01/25/2015 .   Final   Comment: (NOTE) Performed At: [facility] [place], [place] [id number] Cassell Smiles PhD YY:[id number]     Assessment:  Jessica Cline is a 49 y.o. female with cirrhosis and associated moderate thrombocytopenia and a lupus anticoagulant.  Platelet count fluctuates between 36,000 and 50,000.  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    Work-up on 04/10/2014 revealed a platelet count in a blue top tube was 46,000 (rule out pseudothrombocytopenia).  Negative labs included an ANA, ferritin, iron studies, B12, folate, SPEP (apparent polyclonal gammopathy), UPEP, hepatitis B surface antigen, hepatitis C RNA, and HIV testing.  Fibrinogen was 207 (210-470).  PTT was elevated at 45.3 (23.6-35.9) suggestive of a possible factor deficiency or inhibitor.  Additional testing on 04/20/2014 confirmed a lupus anticoagulant.  Lupus anticoagulant testing was positive again on 01/25/2015.  Abdominal ultrasound on 04/18/2014 revealed a prominent left hepatic lobe with mild nodularity and splenomegaly (16.9 cm) suggestive of possible cirrhosis.  Patient denies any alcohol use (last 10 years ago).  EGD in 03/2014 was normal per patient's report.  She received the hepatitis B vaccine series.  Chest CT on 06/01/2014 revealed an abnormal attenuation in the right lobe of the liver for which more definitive characterization by hepatic MRI was suggested to  exclude malignancy.  She notes a liver MRI in [facility] this year.  She presented with a vaginal hemorrhage on 01/05/2015.  She required 3 units of PRBCs and 1 unit pheresed platelets.  She had an severe allergic reaction to platelets.  She will require premedications of Benadryl +/- steroids.  PT was 13.1 (INR 1.18) and PTT was 28.3. Fibrinogen was 158 (208-409).   Von Willebrand testing was negative.  Platelet function assay was c/w aspirin effect.  Symptomatically, she is back to baseline.  She denies any bruising or bleeding.  Plan: 1. Review recent testing at [facility].  Discuss underlying liver disease.  Discuss lupus anticoagulant.  Discuss treatment with blood product support if needed (with premeds secondary to prior allergic reaction).  Discuss need to follow-up with GI and Gynecology.  Discuss medical alert bracelet.  Discuss no aspirin or ibuprofen.  Discuss prior CT showing liver lesion (patient feels she had a liver MRI in [facility]). 2. Obtain copy of liver MRI in [facility] [address] [facility]. 3. Schedule appointment with gynecology in [place] regarding vaginal bleeding. 4. Schedule appointment with Dr. Paulita Fujita, gastroenterologist, regarding potential liver transplant. 5. RTC in 2 months for MD assessment and labs (CBC with diff, CMP, PT/INR, fibrinogen, and AFP).   Lequita Asal, MD  02/01/2015

## 2015-04-03 ENCOUNTER — Inpatient Hospital Stay: Payer: Medicare Other

## 2015-04-03 ENCOUNTER — Inpatient Hospital Stay: Payer: Medicare Other | Admitting: Hematology and Oncology

## 2015-04-16 ENCOUNTER — Encounter: Payer: Self-pay | Admitting: Hematology and Oncology

## 2015-04-17 ENCOUNTER — Inpatient Hospital Stay: Payer: Medicare Other | Attending: Hematology and Oncology | Admitting: Hematology and Oncology

## 2015-04-17 ENCOUNTER — Other Ambulatory Visit: Payer: Self-pay

## 2015-04-17 ENCOUNTER — Encounter: Payer: Self-pay | Admitting: Hematology and Oncology

## 2015-04-17 ENCOUNTER — Inpatient Hospital Stay: Payer: Medicare Other

## 2015-04-17 VITALS — BP 165/80 | HR 75 | Temp 96.4°F | Resp 18 | Ht 66.0 in | Wt 231.7 lb

## 2015-04-17 DIAGNOSIS — D6862 Lupus anticoagulant syndrome: Secondary | ICD-10-CM | POA: Diagnosis not present

## 2015-04-17 DIAGNOSIS — M129 Arthropathy, unspecified: Secondary | ICD-10-CM | POA: Diagnosis not present

## 2015-04-17 DIAGNOSIS — Z79899 Other long term (current) drug therapy: Secondary | ICD-10-CM | POA: Diagnosis not present

## 2015-04-17 DIAGNOSIS — R760 Raised antibody titer: Secondary | ICD-10-CM

## 2015-04-17 DIAGNOSIS — R51 Headache: Secondary | ICD-10-CM | POA: Diagnosis not present

## 2015-04-17 DIAGNOSIS — K7460 Unspecified cirrhosis of liver: Secondary | ICD-10-CM | POA: Insufficient documentation

## 2015-04-17 DIAGNOSIS — Z809 Family history of malignant neoplasm, unspecified: Secondary | ICD-10-CM | POA: Insufficient documentation

## 2015-04-17 DIAGNOSIS — K769 Liver disease, unspecified: Secondary | ICD-10-CM

## 2015-04-17 DIAGNOSIS — D696 Thrombocytopenia, unspecified: Secondary | ICD-10-CM | POA: Diagnosis present

## 2015-04-17 LAB — COMPREHENSIVE METABOLIC PANEL
ALT: 60 U/L — ABNORMAL HIGH (ref 14–54)
AST: 66 U/L — ABNORMAL HIGH (ref 15–41)
Albumin: 4.2 g/dL (ref 3.5–5.0)
Alkaline Phosphatase: 113 U/L (ref 38–126)
Anion gap: 7 (ref 5–15)
BUN: 9 mg/dL (ref 6–20)
CO2: 27 mmol/L (ref 22–32)
Calcium: 9.1 mg/dL (ref 8.9–10.3)
Chloride: 104 mmol/L (ref 101–111)
Creatinine, Ser: 0.75 mg/dL (ref 0.44–1.00)
GFR calc Af Amer: >60 mL/min (ref >60)
GFR calc non Af Amer: >60 mL/min (ref >60)
Glucose, Bld: 132 mg/dL — ABNORMAL HIGH (ref 65–99)
Potassium: 3.9 mmol/L (ref 3.5–5.1)
Sodium: 138 mmol/L (ref 135–145)
Total Bilirubin: 1 mg/dL (ref 0.3–1.2)
Total Protein: 7 g/dL (ref 6.5–8.1)

## 2015-04-17 LAB — CBC WITH DIFFERENTIAL/PLATELET
Basophils Absolute: 0 10*3/uL (ref 0–0.1)
Basophils Relative: 1 %
Eosinophils Absolute: 0.2 10*3/uL (ref 0–0.7)
Eosinophils Relative: 6 %
HCT: 41.7 % (ref 35.0–47.0)
Hemoglobin: 13.8 g/dL (ref 12.0–16.0)
Lymphocytes Relative: 20 %
Lymphs Abs: 0.7 10*3/uL — ABNORMAL LOW (ref 1.0–3.6)
MCH: 28 pg (ref 26.0–34.0)
MCHC: 33.2 g/dL (ref 32.0–36.0)
MCV: 84.4 fL (ref 80.0–100.0)
Monocytes Absolute: 0.2 10*3/uL (ref 0.2–0.9)
Monocytes Relative: 6 %
Neutro Abs: 2.6 10*3/uL (ref 1.4–6.5)
Neutrophils Relative %: 67 %
Platelets: 78 10*3/uL — ABNORMAL LOW (ref 150–440)
RBC: 4.94 MIL/uL (ref 3.80–5.20)
RDW: 16.3 % — ABNORMAL HIGH (ref 11.5–14.5)
WBC: 3.8 10*3/uL (ref 3.6–11.0)

## 2015-04-17 LAB — FIBRINOGEN: Fibrinogen: 190 mg/dL — ABNORMAL LOW (ref 210–470)

## 2015-04-17 LAB — PROTIME-INR
INR: 1.09
Prothrombin Time: 14.3 seconds (ref 11.4–15.0)
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
## 2015-04-18 LAB — AFP TUMOR MARKER: AFP-Tumor Marker: 4.9 ng/mL (ref 0.0–8.3)

## 2015-05-04 ENCOUNTER — Other Ambulatory Visit: Payer: Self-pay | Admitting: *Deleted

## 2015-07-16 ENCOUNTER — Inpatient Hospital Stay: Payer: Medicare Other | Admitting: Hematology and Oncology

## 2015-07-16 ENCOUNTER — Inpatient Hospital Stay: Payer: Medicare Other
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
## 2015-08-07 ENCOUNTER — Inpatient Hospital Stay (HOSPITAL_BASED_OUTPATIENT_CLINIC_OR_DEPARTMENT_OTHER): Payer: Medicare Other | Admitting: Hematology and Oncology

## 2015-08-07 ENCOUNTER — Inpatient Hospital Stay: Payer: Medicare Other | Attending: Hematology and Oncology

## 2015-08-07 ENCOUNTER — Encounter: Payer: Self-pay | Admitting: Hematology and Oncology

## 2015-08-07 ENCOUNTER — Telehealth: Payer: Self-pay

## 2015-08-07 VITALS — BP 108/63 | HR 85 | Temp 94.8°F | Resp 18 | Ht 66.0 in | Wt 229.2 lb

## 2015-08-07 DIAGNOSIS — D6862 Lupus anticoagulant syndrome: Secondary | ICD-10-CM

## 2015-08-07 DIAGNOSIS — M129 Arthropathy, unspecified: Secondary | ICD-10-CM

## 2015-08-07 DIAGNOSIS — Z809 Family history of malignant neoplasm, unspecified: Secondary | ICD-10-CM

## 2015-08-07 DIAGNOSIS — D696 Thrombocytopenia, unspecified: Secondary | ICD-10-CM | POA: Diagnosis not present

## 2015-08-07 DIAGNOSIS — F17290 Nicotine dependence, other tobacco product, uncomplicated: Secondary | ICD-10-CM | POA: Diagnosis not present

## 2015-08-07 DIAGNOSIS — R760 Raised antibody titer: Secondary | ICD-10-CM

## 2015-08-07 DIAGNOSIS — K7460 Unspecified cirrhosis of liver: Secondary | ICD-10-CM | POA: Diagnosis not present

## 2015-08-07 DIAGNOSIS — K769 Liver disease, unspecified: Secondary | ICD-10-CM

## 2015-08-07 LAB — CBC WITH DIFFERENTIAL/PLATELET
Basophils Absolute: 0 10*3/uL (ref 0–0.1)
Basophils Relative: 1 %
Eosinophils Absolute: 0.1 10*3/uL (ref 0–0.7)
Eosinophils Relative: 3 %
HCT: 41.3 % (ref 35.0–47.0)
Hemoglobin: 14.1 g/dL (ref 12.0–16.0)
Lymphocytes Relative: 20 %
Lymphs Abs: 0.8 10*3/uL — ABNORMAL LOW (ref 1.0–3.6)
MCH: 28.3 pg (ref 26.0–34.0)
MCHC: 34.2 g/dL (ref 32.0–36.0)
MCV: 82.9 fL (ref 80.0–100.0)
Monocytes Absolute: 0.3 10*3/uL (ref 0.2–0.9)
Monocytes Relative: 8 %
Neutro Abs: 2.9 10*3/uL (ref 1.4–6.5)
Neutrophils Relative %: 68 %
Platelets: 45 10*3/uL — ABNORMAL LOW (ref 150–440)
RBC: 4.99 MIL/uL (ref 3.80–5.20)
RDW: 17 % — ABNORMAL HIGH (ref 11.5–14.5)
WBC: 4.2 10*3/uL (ref 3.6–11.0)

## 2015-08-07 LAB — COMPREHENSIVE METABOLIC PANEL
ALT: 34 U/L (ref 14–54)
AST: 45 U/L — ABNORMAL HIGH (ref 15–41)
Albumin: 4.6 g/dL (ref 3.5–5.0)
Alkaline Phosphatase: 105 U/L (ref 38–126)
Anion gap: 7 (ref 5–15)
BUN: 16 mg/dL (ref 6–20)
CO2: 25 mmol/L (ref 22–32)
Calcium: 9.1 mg/dL (ref 8.9–10.3)
Chloride: 103 mmol/L (ref 101–111)
Creatinine, Ser: 0.74 mg/dL (ref 0.44–1.00)
GFR calc Af Amer: >60 mL/min (ref >60)
GFR calc non Af Amer: >60 mL/min (ref >60)
Glucose, Bld: 124 mg/dL — ABNORMAL HIGH (ref 65–99)
Potassium: 3.8 mmol/L (ref 3.5–5.1)
Sodium: 135 mmol/L (ref 135–145)
Total Bilirubin: 2.4 mg/dL — ABNORMAL HIGH (ref 0.3–1.2)
Total Protein: 7.6 g/dL (ref 6.5–8.1)

## 2015-08-07 LAB — PROTIME-INR
INR: 1.07
Prothrombin Time: 14.1 seconds (ref 11.4–15.0)

## 2015-08-07 NOTE — Progress Notes (Signed)
Pt reports she saw Dr. Dulce Sellarutlaw and per pt she would need a liver transplant.  After she takes care of estate she will place name on transplant list.  GYN in [place] would like to do a biopsy after pap smear.  Would like to have it done in hospital because of pts risk of bleeding.  According to pt Dr from [place] was supposed to contact Dr. Merlene Pullingorcoran.

## 2015-08-07 NOTE — Progress Notes (Signed)
[place] Clinic day:  08/07/2015   Chief Complaint: Jessica Cline is a 50 y.o. female with cirrhosis, thrombocytopenia, and a lupus anticoagulant who is seen for 4 month assessment.  HPI: The patient was last seen in the medical oncology clinic on 04/17/2015.  At that time, she denied any bruising or bleeding.  Labs included a crit of 41.7, hemoglobin 13.8, MCV 84.4, platelets 78,000, white count 3800 with an ANC of 2600. PT was 14.3 with an INR of 1.09. Fibrinogen was 190.  AFP was 4.9 (normal).  Comprehensive metabolic panel included an AST of 66 and an ALT of 60.  Symptomatically, she is doing well.  She states that [place], gastroenterologist, believes that she requires a liver transplant.  At the present time, she is taking care of estate issues, but plans on being on the transplant list.    She denies any recurrent vaginal bleeding.  Her gynecologist from a [facility] would like to do a biopsy.  Biopsy is being planned in the hospital because of her risk of bleeding.   Past Medical History  Diagnosis Date  . Blood transfusion without reported diagnosis   . Arthritis     No past surgical history on file.  Family History  Problem Relation Age of Onset  . Cancer Father   . Cancer Maternal Grandmother     Social History:  reports that she has never smoked. Her smokeless tobacco use includes Snuff. She reports that she uses illicit drugs (Marijuana). Her alcohol history is not on file.  Patient's contact number is [phone]. The patient is alone today.  Allergies:  Allergies  Allergen Reactions  . Plasma, Human Swelling    Current Medications: Current Outpatient Prescriptions  Medication Sig Dispense Refill  . amLODipine (NORVASC) 10 MG tablet     . celecoxib (CELEBREX) 100 MG capsule     . clonazePAM (KLONOPIN) 1 MG tablet     . desmopressin (DDAVP) 0.01 % SOLN     . sertraline (ZOLOFT) 100 MG tablet     . traZODone  (DESYREL) 100 MG tablet     . albuterol (PROVENTIL HFA;VENTOLIN HFA) 108 (90 Base) MCG/ACT inhaler Inhale into the lungs. Reported on 08/07/2015     No current facility-administered medications for this visit.    Review of Systems:  GENERAL:  Feels good.  No fevers or sweats.  Weight down 2 pounds. PERFORMANCE STATUS (ECOG):  1 HEENT:  No visual changes, runny nose, sore throat, mouth sores or tenderness. Lungs: No shortness of breath or cough.  No hemoptysis. Cardiac:  No chest pain, palpitations, orthopnea, or PND. GI:  No nausea, vomiting, diarrhea, constipation, melena or hematochezia. GU:  No urgency, frequency, dysuria, or hematuria.  No vaginal bleeding. Musculoskeletal:  No back pain.  No joint pain.  No muscle tenderness. Extremities:  No pain or swelling. Skin:  No rashes or skin changes. Neuro:  No headache, numbness or weakness, balance or coordination issues. Endocrine:  No diabetes, thyroid issues, hot flashes or night sweats. Psych:  No mood changes, depression or anxiety. Pain:  No focal pain. Review of systems:  All other systems reviewed and found to be negative.  Physical Exam: Blood pressure 108/63, pulse 85, temperature 94.8 F (34.9 C), temperature source Tympanic, resp. rate 18, height _0  (1.676 m), weight 229 lb 2.7 oz (103.95 kg). GENERAL:  Well developed, well nourished, sitting comfortably in the exam room in no acute distress. MENTAL STATUS:  Alert and oriented to person, place and time. HEAD:  Short graying shoulder length wavy hair.  Normocephalic, atraumatic, face symmetric, no Cushingoid features. EYES:  Blue eyes.  Pupils equal round and reactive to light and accomodation.  No conjunctivitis or scleral icterus. ENT:  Oropharynx clear without lesion.  Tongue normal. Mucous membranes moist.  RESPIRATORY:  Clear to auscultation without rales, wheezes or rhonchi. CARDIOVASCULAR:  Regular rate and rhythm without murmur, rub or gallop. ABDOMEN:  Soft,  non-tender, with active bowel sounds, and no hepatomegaly.  Spleen palpable.  No masses. SKIN:  No rashes, ulcers or lesions.  No petechiae or ecchymosis. EXTREMITIES: No edema, no skin discoloration or tenderness.  No palpable cords. LYMPH NODES: No palpable cervical, supraclavicular, axillary or inguinal adenopathy  NEUROLOGICAL: Unremarkable. PSYCH:  Appropriate.  Appointment on 08/07/2015  Component Date Value Ref Range Status  . WBC 08/07/2015 4.2  3.6 - 11.0 K/uL Final  . RBC 08/07/2015 4.99  3.80 - 5.20 MIL/uL Final  . Hemoglobin 08/07/2015 14.1  12.0 - 16.0 g/dL Final  . HCT 08/07/2015 41.3  35.0 - 47.0 % Final  . MCV 08/07/2015 82.9  80.0 - 100.0 fL Final  . MCH 08/07/2015 28.3  26.0 - 34.0 pg Final  . MCHC 08/07/2015 34.2  32.0 - 36.0 g/dL Final  . RDW 08/07/2015 17.0* 11.5 - 14.5 % Final  . Platelets 08/07/2015 45* 150 - 440 K/uL Final  . Neutrophils Relative % 08/07/2015 68   Final  . Neutro Abs 08/07/2015 2.9  1.4 - 6.5 K/uL Final  . Lymphocytes Relative 08/07/2015 20   Final  . Lymphs Abs 08/07/2015 0.8* 1.0 - 3.6 K/uL Final  . Monocytes Relative 08/07/2015 8   Final  . Monocytes Absolute 08/07/2015 0.3  0.2 - 0.9 K/uL Final  . Eosinophils Relative 08/07/2015 3   Final  . Eosinophils Absolute 08/07/2015 0.1  0 - 0.7 K/uL Final  . Basophils Relative 08/07/2015 1   Final  . Basophils Absolute 08/07/2015 0.0  0 - 0.1 K/uL Final  . Sodium 08/07/2015 135  135 - 145 mmol/L Final  . Potassium 08/07/2015 3.8  3.5 - 5.1 mmol/L Final  . Chloride 08/07/2015 103  101 - 111 mmol/L Final  . CO2 08/07/2015 25  22 - 32 mmol/L Final  . Glucose, Bld 08/07/2015 124* 65 - 99 mg/dL Final  . BUN 08/07/2015 16  6 - 20 mg/dL Final  . Creatinine, Ser 08/07/2015 0.74  0.44 - 1.00 mg/dL Final  . Calcium 08/07/2015 9.1  8.9 - 10.3 mg/dL Final  . Total Protein 08/07/2015 7.6  6.5 - 8.1 g/dL Final  . Albumin 08/07/2015 4.6  3.5 - 5.0 g/dL Final  . AST 08/07/2015 45* 15 - 41 U/L Final  . ALT  08/07/2015 34  14 - 54 U/L Final  . Alkaline Phosphatase 08/07/2015 105  38 - 126 U/L Final  . Total Bilirubin 08/07/2015 2.4* 0.3 - 1.2 mg/dL Final  . GFR calc non Af Amer 08/07/2015 >60  >60 mL/min Final  . GFR calc Af Amer 08/07/2015 >60  >60 mL/min Final   Comment: (NOTE) The eGFR has been calculated using the CKD EPI equation. This calculation has not been validated in all clinical situations. eGFR's persistently <60 mL/min signify possible Chronic Kidney Disease.   . Anion gap 08/07/2015 7  5 - 15 Final    Assessment:  Jessica Cline is a 50 y.o. female with cirrhosis and associated moderate thrombocytopenia and a lupus anticoagulant.  Platelet  count fluctuates between 36,000 and 50,000.  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    Work-up on 04/10/2014 revealed a platelet count in a blue top tube was 46,000 (rule out pseudothrombocytopenia).  Negative labs included an ANA, ferritin, iron studies, B12, folate, SPEP (apparent polyclonal gammopathy), UPEP, hepatitis B surface antigen, hepatitis C RNA, and HIV testing.  Fibrinogen was 207 (210-470).  PTT was elevated at 45.3 (23.6-35.9) suggestive of a possible factor deficiency or inhibitor.  Additional testing on 04/20/2014 confirmed a lupus anticoagulant.  Lupus anticoagulant testing was positive again on 01/25/2015.  Abdominal ultrasound on 04/18/2014 revealed a prominent left hepatic lobe with mild nodularity and splenomegaly (16.9 cm) suggestive of possible cirrhosis.  Patient denies any alcohol use (last 10 years ago).  EGD in 03/2014 was normal per patient's report.  She received the hepatitis B vaccine series.  She is considering potential liver transplant.  AFP was 4.9 (normal) on 04/17/2015.  Chest CT on 06/01/2014 revealed an abnormal attenuation in the right lobe of the liver for which more definitive characterization by hepatic MRI was suggested to exclude malignancy.  She notes a liver MRI in [facility] this year.     She presented with a vaginal hemorrhage on 01/05/2015.  She required 3 units of PRBCs and 1 unit pheresed platelets.  She had an severe allergic reaction to platelets.  She will require premedications of Benadryl +/- steroids.  PT was 13.1 (INR 1.18) and PTT was 28.3. Fibrinogen was 158 (208-409).   Von Willebrand testing was negative.  Platelet function assay was c/w aspirin effect.  She is being considered for hysterectomy versus uterine ablation.  She will likely have a uterine biopsy in the near future.  Symptomatically, she is doing well.  She denies any bruising or bleeding.  She is being considered for the liver transplant list.   Plan: 1. Labs today:  CBC with diff, CMP, PT/INR. 2. Patient to follow-up with gynecology regarding uterine biopsy. 3. Obtain copy of liver MRI in [facility] (not received). 4. RTC in 4 months for MD assessment and labs (CBC with diff, CMP, PT/INR).   Lequita Asal, MD  08/07/2015,  11:42 AM

## 2015-08-08 ENCOUNTER — Other Ambulatory Visit: Payer: Self-pay | Admitting: *Deleted

## 2015-08-08 DIAGNOSIS — D696 Thrombocytopenia, unspecified: Secondary | ICD-10-CM

## 2015-08-08 DIAGNOSIS — R760 Raised antibody titer: Secondary | ICD-10-CM

## 2015-08-08 LAB — AFP TUMOR MARKER: AFP-Tumor Marker: 4.1 ng/mL (ref 0.0–8.3)

## 2015-08-08 NOTE — Telephone Encounter (Signed)
Erroneous note encounter  

## 2015-08-19 ENCOUNTER — Encounter: Payer: Self-pay | Admitting: Hematology and Oncology

## 2015-10-31 ENCOUNTER — Other Ambulatory Visit: Payer: Self-pay | Admitting: *Deleted

## 2015-10-31 DIAGNOSIS — D696 Thrombocytopenia, unspecified: Secondary | ICD-10-CM

## 2015-12-10 ENCOUNTER — Ambulatory Visit: Payer: Medicare Other | Admitting: Hematology and Oncology

## 2015-12-10 ENCOUNTER — Other Ambulatory Visit: Payer: Medicare Other

## 2015-12-23 NOTE — Progress Notes (Signed)
[facility] day:  12/24/15   Chief Complaint: Jessica Cline is a 50 y.o. female with cirrhosis, thrombocytopenia, and a lupus anticoagulant who is seen for 4 month assessment.  HPI: The patient was last seen in the medical oncology clinic on 08/07/2015.  At that time, she was doing well.  She denied any bruising or bleeding.  She was being considered for the liver transplant list. CBC included a hematocrit of 41.3, hemoglobin 14.1, platelets 45,000, WBC 4200 with an ANC of 2900.  PT/INR was 14.1/1.07.  AFP was 4.1.  She was to have followed up with gynecology for a uterine biopsy.  Symptomatically, she notes "whole body arthritis".  She states that she is "eat up with it".  She has had 3 menstrual periods which have been "ok".   She notes a little bit of discomfort in her right upper quadrant.  She denies any significant bruising or bleeding.  She denies any interval infections.   Past Medical History:  Diagnosis Date  . Arthritis   . Blood transfusion without reported diagnosis     History reviewed. No pertinent surgical history.  Family History  Problem Relation Age of Onset  . Cancer Father   . Cancer Maternal Grandmother     Social History:  reports that she has never smoked. Her smokeless tobacco use includes Snuff. She reports that she uses drugs, including Marijuana. Her alcohol history is not on file.  Patient's contact number is [phone]. The patient is alone today.  Allergies:  Allergies  Allergen Reactions  . Plasma, Human Swelling    Current Medications: Current Outpatient Prescriptions  Medication Sig Dispense Refill  . acetaminophen (TYLENOL) 325 MG tablet Take 650 mg by mouth every 6 (six) hours as needed.    Marland Kitchen albuterol (PROVENTIL HFA;VENTOLIN HFA) 108 (90 Base) MCG/ACT inhaler Inhale into the lungs. Reported on 08/07/2015    . amLODipine (NORVASC) 10 MG tablet     . clonazePAM (KLONOPIN) 1 MG tablet     .  desmopressin (DDAVP) 0.01 % SOLN     . sertraline (ZOLOFT) 100 MG tablet     . traZODone (DESYREL) 100 MG tablet      No current facility-administered medications for this visit.     Review of Systems:  GENERAL:  Feels "pretty good".  No fevers or sweats.  Weight up 9 pounds. PERFORMANCE STATUS (ECOG):  1 HEENT:  Brief episode of epistaxis.  No visual changes, runny nose, sore throat, mouth sores or tenderness. Lungs: No shortness of breath or cough.  No hemoptysis. Cardiac:  No chest pain, palpitations, orthopnea, or PND. GI:  Right upper quadrant tenderness.  No nausea, vomiting, diarrhea, constipation, melena or hematochezia. GU:  No urgency, frequency, dysuria, or hematuria.  Menses "ok" x 3. Musculoskeletal:  Right shoulder and foot sore.  No back pain.  No joint pain.  No muscle tenderness. Extremities:  No pain or swelling. Skin:  No rashes or skin changes. Neuro:  No headache, numbness or weakness, balance or coordination issues. Endocrine:  No diabetes, thyroid issues, hot flashes or night sweats. Psych:  No mood changes, depression or anxiety. Pain:  No focal pain. Review of systems:  All other systems reviewed and found to be negative.  Physical Exam: Blood pressure 116/73, pulse 70, temperature 97.3 F (36.3 C), temperature source Tympanic, resp. rate 18, weight 220 lb 10.9 oz (100.1 kg). GENERAL:  Well developed, well nourished, woman sitting comfortably in the exam room  in no acute distress. MENTAL STATUS:  Alert and oriented to person, place and time. HEAD:  Shoulder length gray hair.  Normocephalic, atraumatic, face symmetric, no Cushingoid features. EYES:  Blue eyes.  Pupils equal round and reactive to light and accomodation.  No conjunctivitis or scleral icterus. ENT:  Oropharynx clear without lesion.  Tongue normal. Mucous membranes moist.  RESPIRATORY:  Clear to auscultation without rales, wheezes or rhonchi. CARDIOVASCULAR:  Regular rate and rhythm without  murmur, rub or gallop. ABDOMEN:  Soft, non-tender, with active bowel sounds, and no hepatomegaly.  Spleen palpable.  No masses. SKIN:  No rashes, ulcers or lesions.  No petechiae or ecchymosis. EXTREMITIES: No edema, no skin discoloration or tenderness.  No palpable cords. LYMPH NODES: No palpable cervical, supraclavicular, axillary or inguinal adenopathy  NEUROLOGICAL: Unremarkable. PSYCH:  Appropriate.   Appointment on 12/24/2015  Component Date Value Ref Range Status  . Sodium 12/24/2015 140  135 - 145 mmol/L Final  . Potassium 12/24/2015 3.9  3.5 - 5.1 mmol/L Final  . Chloride 12/24/2015 108  101 - 111 mmol/L Final  . CO2 12/24/2015 28  22 - 32 mmol/L Final  . Glucose, Bld 12/24/2015 131* 65 - 99 mg/dL Final  . BUN 12/24/2015 10  6 - 20 mg/dL Final  . Creatinine, Ser 12/24/2015 0.81  0.44 - 1.00 mg/dL Final  . Calcium 12/24/2015 9.5  8.9 - 10.3 mg/dL Final  . Total Protein 12/24/2015 7.5  6.5 - 8.1 g/dL Final  . Albumin 12/24/2015 4.7  3.5 - 5.0 g/dL Final  . AST 12/24/2015 35  15 - 41 U/L Final  . ALT 12/24/2015 32  14 - 54 U/L Final  . Alkaline Phosphatase 12/24/2015 113  38 - 126 U/L Final  . Total Bilirubin 12/24/2015 2.1* 0.3 - 1.2 mg/dL Final  . GFR calc non Af Amer 12/24/2015 >60  >60 mL/min Final  . GFR calc Af Amer 12/24/2015 >60  >60 mL/min Final   Comment: (NOTE) The eGFR has been calculated using the CKD EPI equation. This calculation has not been validated in all clinical situations. eGFR's persistently <60 mL/min signify possible Chronic Kidney Disease.   . Anion gap 12/24/2015 4* 5 - 15 Final  . Prothrombin Time 12/24/2015 14.5  11.4 - 15.2 seconds Final  . INR 12/24/2015 1.12   Final  . WBC 12/24/2015 3.8  3.6 - 11.0 K/uL Final  . RBC 12/24/2015 5.24* 3.80 - 5.20 MIL/uL Final  . Hemoglobin 12/24/2015 15.4  12.0 - 16.0 g/dL Final  . HCT 12/24/2015 44.9  35.0 - 47.0 % Final  . MCV 12/24/2015 85.6  80.0 - 100.0 fL Final  . MCH 12/24/2015 29.4  26.0 - 34.0 pg  Final  . MCHC 12/24/2015 34.3  32.0 - 36.0 g/dL Final  . RDW 12/24/2015 15.2* 11.5 - 14.5 % Final  . Platelets 12/24/2015 58* 150 - 440 K/uL Final  . Neutrophils Relative % 12/24/2015 73  % Final  . Neutro Abs 12/24/2015 2.8  1.4 - 6.5 K/uL Final  . Lymphocytes Relative 12/24/2015 18  % Final  . Lymphs Abs 12/24/2015 0.7* 1.0 - 3.6 K/uL Final  . Monocytes Relative 12/24/2015 6  % Final  . Monocytes Absolute 12/24/2015 0.2  0.2 - 0.9 K/uL Final  . Eosinophils Relative 12/24/2015 2  % Final  . Eosinophils Absolute 12/24/2015 0.1  0 - 0.7 K/uL Final  . Basophils Relative 12/24/2015 1  % Final  . Basophils Absolute 12/24/2015 0.0  0 - 0.1 K/uL  Final    Assessment:  Jessica Cline is a 50 y.o. female with cirrhosis and associated moderate thrombocytopenia and a lupus anticoagulant.  Platelet count fluctuates between 36,000 and 50,000.  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    Work-up on 04/10/2014 revealed a platelet count in a blue top tube was 46,000 (rule out pseudothrombocytopenia).  Negative labs included an ANA, ferritin, iron studies, B12, folate, SPEP (apparent polyclonal gammopathy), UPEP, hepatitis B surface antigen, hepatitis C RNA, and HIV testing.  Fibrinogen was 207 (210-470).  PTT was elevated at 45.3 (23.6-35.9) suggestive of a possible factor deficiency or inhibitor.  Additional testing on 04/20/2014 confirmed a lupus anticoagulant.  Lupus anticoagulant testing was positive again on 01/25/2015.  Abdominal ultrasound on 04/18/2014 revealed a prominent left hepatic lobe with mild nodularity and splenomegaly (16.9 cm) suggestive of possible cirrhosis.  Patient denies any alcohol use (last 10 years ago).  EGD in 03/2014 was normal per patient's report.  She received the hepatitis B vaccine series.  She is considering potential liver transplant.    AFP was 4.9 (normal) on 04/17/2015 and 4.1 on 08/07/2015.  Chest CT on 06/01/2014 revealed an abnormal attenuation in the  right lobe of the liver for which more definitive characterization by hepatic MRI was suggested to exclude malignancy.  She notes a liver MRI in [facility] this year.    She presented with a vaginal hemorrhage on 01/05/2015.  She required 3 units of PRBCs and 1 unit pheresed platelets.  She had an severe allergic reaction to platelets.  She will require premedications of Benadryl +/- steroids.  PT was 13.1 (INR 1.18) and PTT was 28.3. Fibrinogen was 158 (208-409).   Von Willebrand testing was negative.  Platelet function assay was c/w aspirin effect.  She is being considered for hysterectomy versus uterine ablation.  She will likely have a uterine biopsy in the near future.  Symptomatically, she is doing "ok".  She denies any bruising or bleeding.  Bilirubin is 2.1.  Plan: 1.  Labs today:  CBC with diff, CMP, PT/INR. 2.  Discuss need for follow-up right upper quadrant ultrasounds and AFP every 77month. 3.  Patient to follow-up with transplant team. 4.  RTC in 3 months for MD assessment, labs (CBC with diff, CMP, PT/INR, AFP), and abdominal ultrasound.   MLequita Asal MD  12/24/2015,  2:32 PM

## 2015-12-24 ENCOUNTER — Inpatient Hospital Stay: Payer: Medicare Other

## 2015-12-24 ENCOUNTER — Other Ambulatory Visit: Payer: Self-pay | Admitting: *Deleted

## 2015-12-24 ENCOUNTER — Encounter: Payer: Self-pay | Admitting: Hematology and Oncology

## 2015-12-24 ENCOUNTER — Inpatient Hospital Stay: Payer: Medicare Other | Attending: Hematology and Oncology | Admitting: Hematology and Oncology

## 2015-12-24 VITALS — BP 116/73 | HR 70 | Temp 97.3°F | Resp 18 | Wt 220.7 lb

## 2015-12-24 DIAGNOSIS — D6862 Lupus anticoagulant syndrome: Secondary | ICD-10-CM | POA: Diagnosis not present

## 2015-12-24 DIAGNOSIS — R161 Splenomegaly, not elsewhere classified: Secondary | ICD-10-CM | POA: Diagnosis not present

## 2015-12-24 DIAGNOSIS — M129 Arthropathy, unspecified: Secondary | ICD-10-CM

## 2015-12-24 DIAGNOSIS — Z809 Family history of malignant neoplasm, unspecified: Secondary | ICD-10-CM | POA: Diagnosis not present

## 2015-12-24 DIAGNOSIS — Z79899 Other long term (current) drug therapy: Secondary | ICD-10-CM | POA: Insufficient documentation

## 2015-12-24 DIAGNOSIS — D696 Thrombocytopenia, unspecified: Secondary | ICD-10-CM

## 2015-12-24 DIAGNOSIS — Z87891 Personal history of nicotine dependence: Secondary | ICD-10-CM | POA: Diagnosis not present

## 2015-12-24 DIAGNOSIS — K7460 Unspecified cirrhosis of liver: Secondary | ICD-10-CM | POA: Insufficient documentation

## 2015-12-24 DIAGNOSIS — K7469 Other cirrhosis of liver: Secondary | ICD-10-CM

## 2015-12-24 DIAGNOSIS — R760 Raised antibody titer: Secondary | ICD-10-CM

## 2015-12-24 LAB — COMPREHENSIVE METABOLIC PANEL
ALT: 32 U/L (ref 14–54)
AST: 35 U/L (ref 15–41)
Albumin: 4.7 g/dL (ref 3.5–5.0)
Alkaline Phosphatase: 113 U/L (ref 38–126)
Anion gap: 4 — ABNORMAL LOW (ref 5–15)
BUN: 10 mg/dL (ref 6–20)
CO2: 28 mmol/L (ref 22–32)
Calcium: 9.5 mg/dL (ref 8.9–10.3)
Chloride: 108 mmol/L (ref 101–111)
Creatinine, Ser: 0.81 mg/dL (ref 0.44–1.00)
GFR calc Af Amer: >60 mL/min (ref >60)
GFR calc non Af Amer: >60 mL/min (ref >60)
Glucose, Bld: 131 mg/dL — ABNORMAL HIGH (ref 65–99)
Potassium: 3.9 mmol/L (ref 3.5–5.1)
Sodium: 140 mmol/L (ref 135–145)
Total Bilirubin: 2.1 mg/dL — ABNORMAL HIGH (ref 0.3–1.2)
Total Protein: 7.5 g/dL (ref 6.5–8.1)

## 2015-12-24 LAB — CBC WITH DIFFERENTIAL/PLATELET
Basophils Absolute: 0 10*3/uL (ref 0–0.1)
Basophils Relative: 1 %
Eosinophils Absolute: 0.1 10*3/uL (ref 0–0.7)
Eosinophils Relative: 2 %
HCT: 44.9 % (ref 35.0–47.0)
Hemoglobin: 15.4 g/dL (ref 12.0–16.0)
Lymphocytes Relative: 18 %
Lymphs Abs: 0.7 10*3/uL — ABNORMAL LOW (ref 1.0–3.6)
MCH: 29.4 pg (ref 26.0–34.0)
MCHC: 34.3 g/dL (ref 32.0–36.0)
MCV: 85.6 fL (ref 80.0–100.0)
Monocytes Absolute: 0.2 10*3/uL (ref 0.2–0.9)
Monocytes Relative: 6 %
Neutro Abs: 2.8 10*3/uL (ref 1.4–6.5)
Neutrophils Relative %: 73 %
Platelets: 58 10*3/uL — ABNORMAL LOW (ref 150–440)
RBC: 5.24 MIL/uL — ABNORMAL HIGH (ref 3.80–5.20)
RDW: 15.2 % — ABNORMAL HIGH (ref 11.5–14.5)
WBC: 3.8 10*3/uL (ref 3.6–11.0)

## 2015-12-24 LAB — PROTIME-INR
INR: 1.12
Prothrombin Time: 14.5 seconds (ref 11.4–15.2)

## 2015-12-24 NOTE — Progress Notes (Signed)
Patient states she bled from her left ear a few weeks ago.  Also states within a few days she was blowing her nose and got blood, also on left side.  No problems since then.

## 2016-02-18 ENCOUNTER — Telehealth: Payer: Self-pay | Admitting: *Deleted

## 2016-02-18 NOTE — Telephone Encounter (Signed)
Requesting a letter for her lawyer about her condition and liver transplant. Please call her back

## 2016-02-21 ENCOUNTER — Encounter: Payer: Self-pay | Admitting: *Deleted

## 2016-02-21 NOTE — Telephone Encounter (Signed)
I called pt back on 11/27 5:30 pm and spoek to her about the doctor rec: a release of medical info. Patient did not want to do that. She just needs a letter about her health. I told her that I would discuss this with corcoran and let her know.  I called her back today and told her that dr. Merlene Pullingorcoran would be willing to write a letter stating how often she comes to office and what the dx is and as far as her cirrhosis that she is followed by dr. Dulce Sellarutlaw and she is ok with this. I did letter and signed by corcoran and pt wants it mailed to her house to give to her uncle's lawyer.  I put it in the mail room today.

## 2016-03-27 ENCOUNTER — Inpatient Hospital Stay: Payer: Medicare Other

## 2016-03-27 ENCOUNTER — Ambulatory Visit: Admission: RE | Admit: 2016-03-27 | Payer: Medicare Other | Source: Ambulatory Visit

## 2016-03-27 ENCOUNTER — Inpatient Hospital Stay: Payer: Medicare Other | Admitting: Hematology and Oncology

## 2016-03-27 DIAGNOSIS — R161 Splenomegaly, not elsewhere classified: Secondary | ICD-10-CM | POA: Insufficient documentation

## 2016-03-27 NOTE — Progress Notes (Deleted)
[facility] day:  03/27/16   Chief Complaint: Jessica Cline is a 51 y.o. female with cirrhosis, thrombocytopenia, and a lupus anticoagulant who is seen for 3 month assessment.  HPI: The patient was last seen in the medical oncology clinic on 12/24/2015.  At that time, she noted "whole body arthritis".  She described a little bit of right upper quadrant pain.  She was to follow-up with the transplant team.  she was doing well.  She denied any bruising or bleeding.  She was being considered for the liver transplant list. CBC included a hematocrit of 41.3, hemoglobin 14.1, platelets 45,000, WBC 4200 with an ANC of 2900.  PT/INR was 14.1/1.07.  AFP was 4.1.  She was to have followed up with gynecology for a uterine biopsy.  Symptomatically, she notes "whole body arthritis".  She states that she is "eat up with it".  She has had 3 menstrual periods which have been "ok".   She notes a little bit of discomfort in her right upper quadrant.  She denies any significant bruising or bleeding.  She denies any interval infections.   Past Medical History:  Diagnosis Date  . Arthritis   . Blood transfusion without reported diagnosis     No past surgical history on file.  Family History  Problem Relation Age of Onset  . Cancer Father   . Cancer Maternal Grandmother     Social History:  reports that she has never smoked. Her smokeless tobacco use includes Snuff. She reports that she uses drugs, including Marijuana. Her alcohol history is not on file.  Patient's contact number is [phone]. The patient is alone today.  Allergies:  Allergies  Allergen Reactions  . Plasma, Human Swelling    Current Medications: Current Outpatient Prescriptions  Medication Sig Dispense Refill  . acetaminophen (TYLENOL) 325 MG tablet Take 650 mg by mouth every 6 (six) hours as needed.    Marland Kitchen albuterol (PROVENTIL HFA;VENTOLIN HFA) 108 (90 Base) MCG/ACT inhaler Inhale into  the lungs. Reported on 08/07/2015    . amLODipine (NORVASC) 10 MG tablet     . clonazePAM (KLONOPIN) 1 MG tablet     . desmopressin (DDAVP) 0.01 % SOLN     . sertraline (ZOLOFT) 100 MG tablet     . traZODone (DESYREL) 100 MG tablet      No current facility-administered medications for this visit.     Review of Systems:  GENERAL:  Feels "pretty good".  No fevers or sweats.  Weight up 9 pounds. PERFORMANCE STATUS (ECOG):  1 HEENT:  Brief episode of epistaxis.  No visual changes, runny nose, sore throat, mouth sores or tenderness. Lungs: No shortness of breath or cough.  No hemoptysis. Cardiac:  No chest pain, palpitations, orthopnea, or PND. GI:  Right upper quadrant tenderness.  No nausea, vomiting, diarrhea, constipation, melena or hematochezia. GU:  No urgency, frequency, dysuria, or hematuria.  Menses "ok" x 3. Musculoskeletal:  Right shoulder and foot sore.  No back pain.  No joint pain.  No muscle tenderness. Extremities:  No pain or swelling. Skin:  No rashes or skin changes. Neuro:  No headache, numbness or weakness, balance or coordination issues. Endocrine:  No diabetes, thyroid issues, hot flashes or night sweats. Psych:  No mood changes, depression or anxiety. Pain:  No focal pain. Review of systems:  All other systems reviewed and found to be negative.  Physical Exam: There were no vitals taken for this visit. GENERAL:  Well developed, well nourished, woman sitting comfortably in the exam room in no acute distress. MENTAL STATUS:  Alert and oriented to person, place and time. HEAD:  Shoulder length gray hair.  Normocephalic, atraumatic, face symmetric, no Cushingoid features. EYES:  Blue eyes.  Pupils equal round and reactive to light and accomodation.  No conjunctivitis or scleral icterus. ENT:  Oropharynx clear without lesion.  Tongue normal. Mucous membranes moist.  RESPIRATORY:  Clear to auscultation without rales, wheezes or rhonchi. CARDIOVASCULAR:  Regular rate and  rhythm without murmur, rub or gallop. ABDOMEN:  Soft, non-tender, with active bowel sounds, and no hepatomegaly.  Spleen palpable.  No masses. SKIN:  No rashes, ulcers or lesions.  No petechiae or ecchymosis. EXTREMITIES: No edema, no skin discoloration or tenderness.  No palpable cords. LYMPH NODES: No palpable cervical, supraclavicular, axillary or inguinal adenopathy  NEUROLOGICAL: Unremarkable. PSYCH:  Appropriate.   No visits with results within 3 Day(s) from this visit.  Latest known visit with results is:  Appointment on 12/24/2015  Component Date Value Ref Range Status  . Sodium 12/24/2015 140  135 - 145 mmol/L Final  . Potassium 12/24/2015 3.9  3.5 - 5.1 mmol/L Final  . Chloride 12/24/2015 108  101 - 111 mmol/L Final  . CO2 12/24/2015 28  22 - 32 mmol/L Final  . Glucose, Bld 12/24/2015 131* 65 - 99 mg/dL Final  . BUN 41/74/0814 10  6 - 20 mg/dL Final  . Creatinine, Ser 12/24/2015 0.81  0.44 - 1.00 mg/dL Final  . Calcium 48/18/5631 9.5  8.9 - 10.3 mg/dL Final  . Total Protein 12/24/2015 7.5  6.5 - 8.1 g/dL Final  . Albumin 49/70/2637 4.7  3.5 - 5.0 g/dL Final  . AST 85/88/5027 35  15 - 41 U/L Final  . ALT 12/24/2015 32  14 - 54 U/L Final  . Alkaline Phosphatase 12/24/2015 113  38 - 126 U/L Final  . Total Bilirubin 12/24/2015 2.1* 0.3 - 1.2 mg/dL Final  . GFR calc non Af Amer 12/24/2015 >60  >60 mL/min Final  . GFR calc Af Amer 12/24/2015 >60  >60 mL/min Final   Comment: (NOTE) The eGFR has been calculated using the CKD EPI equation. This calculation has not been validated in all clinical situations. eGFR's persistently <60 mL/min signify possible Chronic Kidney Disease.   . Anion gap 12/24/2015 4* 5 - 15 Final  . Prothrombin Time 12/24/2015 14.5  11.4 - 15.2 seconds Final  . INR 12/24/2015 1.12   Final  . WBC 12/24/2015 3.8  3.6 - 11.0 K/uL Final  . RBC 12/24/2015 5.24* 3.80 - 5.20 MIL/uL Final  . Hemoglobin 12/24/2015 15.4  12.0 - 16.0 g/dL Final  . HCT 74/02/8785  44.9  35.0 - 47.0 % Final  . MCV 12/24/2015 85.6  80.0 - 100.0 fL Final  . MCH 12/24/2015 29.4  26.0 - 34.0 pg Final  . MCHC 12/24/2015 34.3  32.0 - 36.0 g/dL Final  . RDW 76/72/0947 15.2* 11.5 - 14.5 % Final  . Platelets 12/24/2015 58* 150 - 440 K/uL Final  . Neutrophils Relative % 12/24/2015 73  % Final  . Neutro Abs 12/24/2015 2.8  1.4 - 6.5 K/uL Final  . Lymphocytes Relative 12/24/2015 18  % Final  . Lymphs Abs 12/24/2015 0.7* 1.0 - 3.6 K/uL Final  . Monocytes Relative 12/24/2015 6  % Final  . Monocytes Absolute 12/24/2015 0.2  0.2 - 0.9 K/uL Final  . Eosinophils Relative 12/24/2015 2  % Final  . Eosinophils  Absolute 12/24/2015 0.1  0 - 0.7 K/uL Final  . Basophils Relative 12/24/2015 1  % Final  . Basophils Absolute 12/24/2015 0.0  0 - 0.1 K/uL Final    Assessment:  Jessica Cline is a 51 y.o. female with cirrhosis and associated moderate thrombocytopenia and a lupus anticoagulant.  Platelet count fluctuates between 36,000 and 50,000.  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    Work-up on 04/10/2014 revealed a platelet count in a blue top tube was 46,000 (rule out pseudothrombocytopenia).  Negative labs included an ANA, ferritin, iron studies, B12, folate, SPEP (apparent polyclonal gammopathy), UPEP, hepatitis B surface antigen, hepatitis C RNA, and HIV testing.  Fibrinogen was 207 (210-470).  PTT was elevated at 45.3 (23.6-35.9) suggestive of a possible factor deficiency or inhibitor.  Additional testing on 04/20/2014 confirmed a lupus anticoagulant.  Lupus anticoagulant testing was positive again on 01/25/2015.  Abdominal ultrasound on 04/18/2014 revealed a prominent left hepatic lobe with mild nodularity and splenomegaly (16.9 cm) suggestive of possible cirrhosis.  Patient denies any alcohol use (last 10 years ago).  EGD in 03/2014 was normal per patient's report.  She received the hepatitis B vaccine series.  She is considering potential liver transplant.    AFP was 4.9  (normal) on 04/17/2015 and 4.1 on 08/07/2015.  Chest CT on 06/01/2014 revealed an abnormal attenuation in the right lobe of the liver for which more definitive characterization by hepatic MRI was suggested to exclude malignancy.  She notes a liver MRI in [facility] this year.    She presented with a vaginal hemorrhage on 01/05/2015.  She required 3 units of PRBCs and 1 unit pheresed platelets.  She had an severe allergic reaction to platelets.  She will require premedications of Benadryl +/- steroids.  PT was 13.1 (INR 1.18) and PTT was 28.3. Fibrinogen was 158 (208-409).   Von Willebrand testing was negative.  Platelet function assay was c/w aspirin effect.  She is being considered for hysterectomy versus uterine ablation.  She will likely have a uterine biopsy in the near future.  Symptomatically, she is doing "ok".  She denies any bruising or bleeding.  Bilirubin is 2.1.  Plan: 1.  Labs today:  CBC with diff, CMP, PT/INR.  2.  Discuss need for follow-up right upper quadrant ultrasounds and AFP every 49month. 3.  Patient to follow-up with transplant team. 4.  RTC in 3 months for MD assessment, labs (CBC with diff, CMP, PT/INR, AFP), and abdominal ultrasound.   MLequita Asal MD  03/27/2016,  4:09 AM

## 2016-04-02 ENCOUNTER — Ambulatory Visit
Admission: RE | Admit: 2016-04-02 | Discharge: 2016-04-02 | Disposition: A | Discharge status: Home / Self Care | Payer: Medicare Other | Source: Ambulatory Visit | Attending: Hematology and Oncology | Admitting: Hematology and Oncology

## 2016-04-02 DIAGNOSIS — K7460 Unspecified cirrhosis of liver: Secondary | ICD-10-CM

## 2016-04-07 ENCOUNTER — Inpatient Hospital Stay: Payer: Medicare Other | Admitting: Hematology and Oncology

## 2016-04-07 ENCOUNTER — Inpatient Hospital Stay: Payer: Medicare Other

## 2016-04-11 ENCOUNTER — Other Ambulatory Visit: Payer: Self-pay | Admitting: *Deleted

## 2016-04-11 DIAGNOSIS — D696 Thrombocytopenia, unspecified: Secondary | ICD-10-CM

## 2016-04-14 ENCOUNTER — Inpatient Hospital Stay: Payer: Medicare Other | Attending: Hematology and Oncology

## 2016-04-14 ENCOUNTER — Encounter: Payer: Self-pay | Admitting: Hematology and Oncology

## 2016-04-14 ENCOUNTER — Inpatient Hospital Stay (HOSPITAL_BASED_OUTPATIENT_CLINIC_OR_DEPARTMENT_OTHER): Payer: Medicare Other | Admitting: Hematology and Oncology

## 2016-04-14 VITALS — BP 132/74 | HR 80 | Temp 96.3°F | Resp 18 | Wt 230.4 lb

## 2016-04-14 DIAGNOSIS — D6862 Lupus anticoagulant syndrome: Secondary | ICD-10-CM | POA: Diagnosis not present

## 2016-04-14 DIAGNOSIS — Z79899 Other long term (current) drug therapy: Secondary | ICD-10-CM | POA: Diagnosis not present

## 2016-04-14 DIAGNOSIS — D696 Thrombocytopenia, unspecified: Secondary | ICD-10-CM

## 2016-04-14 DIAGNOSIS — M129 Arthropathy, unspecified: Secondary | ICD-10-CM

## 2016-04-14 DIAGNOSIS — F17290 Nicotine dependence, other tobacco product, uncomplicated: Secondary | ICD-10-CM | POA: Diagnosis not present

## 2016-04-14 DIAGNOSIS — Z809 Family history of malignant neoplasm, unspecified: Secondary | ICD-10-CM | POA: Insufficient documentation

## 2016-04-14 DIAGNOSIS — K7460 Unspecified cirrhosis of liver: Secondary | ICD-10-CM | POA: Insufficient documentation

## 2016-04-14 DIAGNOSIS — R1011 Right upper quadrant pain: Secondary | ICD-10-CM | POA: Insufficient documentation

## 2016-04-14 DIAGNOSIS — R760 Raised antibody titer: Secondary | ICD-10-CM

## 2016-04-14 DIAGNOSIS — R161 Splenomegaly, not elsewhere classified: Secondary | ICD-10-CM

## 2016-04-14 LAB — CBC WITH DIFFERENTIAL/PLATELET
Basophils Absolute: 0 10*3/uL (ref 0–0.1)
Basophils Relative: 1 %
Eosinophils Absolute: 0.1 10*3/uL (ref 0–0.7)
Eosinophils Relative: 3 %
HCT: 43.9 % (ref 35.0–47.0)
Hemoglobin: 14.9 g/dL (ref 12.0–16.0)
Lymphocytes Relative: 22 %
Lymphs Abs: 1 10*3/uL (ref 1.0–3.6)
MCH: 28.4 pg (ref 26.0–34.0)
MCHC: 33.9 g/dL (ref 32.0–36.0)
MCV: 83.7 fL (ref 80.0–100.0)
Monocytes Absolute: 0.3 10*3/uL (ref 0.2–0.9)
Monocytes Relative: 7 %
Neutro Abs: 3.1 10*3/uL (ref 1.4–6.5)
Neutrophils Relative %: 67 %
Platelets: 52 10*3/uL — ABNORMAL LOW (ref 150–440)
RBC: 5.24 MIL/uL — ABNORMAL HIGH (ref 3.80–5.20)
RDW: 15.7 % — ABNORMAL HIGH (ref 11.5–14.5)
WBC: 4.6 10*3/uL (ref 3.6–11.0)

## 2016-04-14 LAB — PROTIME-INR
INR: 1.06
Prothrombin Time: 13.8 seconds (ref 11.4–15.2)

## 2016-04-14 LAB — COMPREHENSIVE METABOLIC PANEL
ALT: 39 U/L (ref 14–54)
AST: 42 U/L — ABNORMAL HIGH (ref 15–41)
Albumin: 4.7 g/dL (ref 3.5–5.0)
Alkaline Phosphatase: 129 U/L — ABNORMAL HIGH (ref 38–126)
Anion gap: 6 (ref 5–15)
BUN: 14 mg/dL (ref 6–20)
CO2: 29 mmol/L (ref 22–32)
Calcium: 9.7 mg/dL (ref 8.9–10.3)
Chloride: 102 mmol/L (ref 101–111)
Creatinine, Ser: 0.84 mg/dL (ref 0.44–1.00)
GFR calc Af Amer: >60 mL/min (ref >60)
GFR calc non Af Amer: >60 mL/min (ref >60)
Glucose, Bld: 132 mg/dL — ABNORMAL HIGH (ref 65–99)
Potassium: 4.3 mmol/L (ref 3.5–5.1)
Sodium: 137 mmol/L (ref 135–145)
Total Bilirubin: 1.7 mg/dL — ABNORMAL HIGH (ref 0.3–1.2)
Total Protein: 7.7 g/dL (ref 6.5–8.1)

## 2016-04-14 NOTE — Progress Notes (Signed)
Patient states she broke some ribs over the past week while trying to get her water thawed out in her pump house.  Otherwise offers no complaints.

## 2016-04-14 NOTE — Progress Notes (Signed)
[facility] day:  04/14/16   Chief Complaint: Jessica Cline is a 51 y.o. female with cirrhosis, thrombocytopenia, and a lupus anticoagulant who is seen for 3 month assessment.  HPI: The patient was last seen in the medical oncology clinic on 12/24/2015.  At that time, she noted "whole body arthritis".  She described a little bit of right upper quadrant pain.  She was to follow-up with the transplant team.  She notes cracking some ribs leaning over a cinder block while trying to get her water thawed out in her pump house.  She is back on Celebrex.  She denies any bruising or bleeding.  She is "getting ready to see the transplant team".   Past Medical History:  Diagnosis Date  . Arthritis   . Blood transfusion without reported diagnosis     History reviewed. No pertinent surgical history.  Family History  Problem Relation Age of Onset  . Cancer Father   . Cancer Maternal Grandmother     Social History:  reports that she has never smoked. Her smokeless tobacco use includes Snuff. She reports that she uses drugs, including Marijuana. Her alcohol history is not on file.  Patient's contact number is [phone]. The patient is alone today.  Allergies:  Allergies  Allergen Reactions  . Plasma, Human Swelling    Current Medications: Current Outpatient Prescriptions  Medication Sig Dispense Refill  . acetaminophen (TYLENOL) 325 MG tablet Take 650 mg by mouth every 6 (six) hours as needed.    Marland Kitchen albuterol (PROVENTIL HFA;VENTOLIN HFA) 108 (90 Base) MCG/ACT inhaler Inhale into the lungs. Reported on 08/07/2015    . amLODipine (NORVASC) 10 MG tablet     . clonazePAM (KLONOPIN) 1 MG tablet     . desmopressin (DDAVP) 0.01 % SOLN     . sertraline (ZOLOFT) 100 MG tablet     . traZODone (DESYREL) 100 MG tablet     . celecoxib (CELEBREX) 100 MG capsule Take 100 mg by mouth daily.     No current facility-administered medications for this visit.      Review of Systems:  GENERAL:  Feels "pretty good".  No fevers or sweats.  Weight up 10 pounds. PERFORMANCE STATUS (ECOG):  1 HEENT:  No visual changes, runny nose, sore throat, mouth sores or tenderness. Lungs: No shortness of breath or cough.  No hemoptysis. Cardiac:  No chest pain, palpitations, orthopnea, or PND. GI:  Eating well.   No nausea, vomiting, diarrhea, constipation, melena or hematochezia. GU:  No urgency, frequency, dysuria, or hematuria.  No heavy menses. Musculoskeletal:  Cracked ribs (see HPI).  No back pain.  No joint pain.  No muscle tenderness. Extremities:  No pain or swelling. Skin:  No rashes or skin changes. Neuro:  No headache, numbness or weakness, balance or coordination issues. Endocrine:  No diabetes, thyroid issues, hot flashes or night sweats. Psych:  No mood changes, depression or anxiety. Pain:  No focal pain. Review of systems:  All other systems reviewed and found to be negative.  Physical Exam: Blood pressure 132/74, pulse 80, temperature (!) 96.3 F (35.7 C), temperature source Tympanic, resp. rate 18, weight 230 lb 6.1 oz (104.5 kg). GENERAL:  Well developed, well nourished, woman sitting comfortably in the exam room in no acute distress. MENTAL STATUS:  Alert and oriented to person, place and time. HEAD:  Shoulder length gray hair.  Normocephalic, atraumatic, face symmetric, no Cushingoid features. EYES:  Blue eyes.  Pupils equal  round and reactive to light and accomodation.  No conjunctivitis or scleral icterus. ENT:  Oropharynx clear without lesion.  Tongue normal. Mucous membranes moist.  RESPIRATORY:  Clear to auscultation without rales, wheezes or rhonchi. CARDIOVASCULAR:  Regular rate and rhythm without murmur, rub or gallop. ABDOMEN:  Soft, non-tender, with active bowel sounds, and no hepatomegaly.  Spleen palpable.  No masses. SKIN:  No rashes, ulcers or lesions.  No petechiae or ecchymosis. EXTREMITIES: No edema, no skin  discoloration or tenderness.  No palpable cords. LYMPH NODES: No palpable cervical, supraclavicular, axillary or inguinal adenopathy  NEUROLOGICAL: Unremarkable. PSYCH:  Appropriate.   Appointment on 04/14/2016  Component Date Value Ref Range Status  . WBC 04/14/2016 4.6  3.6 - 11.0 K/uL Final  . RBC 04/14/2016 5.24* 3.80 - 5.20 MIL/uL Final  . Hemoglobin 04/14/2016 14.9  12.0 - 16.0 g/dL Final  . HCT 04/14/2016 43.9  35.0 - 47.0 % Final  . MCV 04/14/2016 83.7  80.0 - 100.0 fL Final  . MCH 04/14/2016 28.4  26.0 - 34.0 pg Final  . MCHC 04/14/2016 33.9  32.0 - 36.0 g/dL Final  . RDW 04/14/2016 15.7* 11.5 - 14.5 % Final  . Platelets 04/14/2016 52* 150 - 440 K/uL Final  . Neutrophils Relative % 04/14/2016 67  % Final  . Neutro Abs 04/14/2016 3.1  1.4 - 6.5 K/uL Final  . Lymphocytes Relative 04/14/2016 22  % Final  . Lymphs Abs 04/14/2016 1.0  1.0 - 3.6 K/uL Final  . Monocytes Relative 04/14/2016 7  % Final  . Monocytes Absolute 04/14/2016 0.3  0.2 - 0.9 K/uL Final  . Eosinophils Relative 04/14/2016 3  % Final  . Eosinophils Absolute 04/14/2016 0.1  0 - 0.7 K/uL Final  . Basophils Relative 04/14/2016 1  % Final  . Basophils Absolute 04/14/2016 0.0  0 - 0.1 K/uL Final  . Sodium 04/14/2016 137  135 - 145 mmol/L Final  . Potassium 04/14/2016 4.3  3.5 - 5.1 mmol/L Final  . Chloride 04/14/2016 102  101 - 111 mmol/L Final  . CO2 04/14/2016 29  22 - 32 mmol/L Final  . Glucose, Bld 04/14/2016 132* 65 - 99 mg/dL Final  . BUN 04/14/2016 14  6 - 20 mg/dL Final  . Creatinine, Ser 04/14/2016 0.84  0.44 - 1.00 mg/dL Final  . Calcium 04/14/2016 9.7  8.9 - 10.3 mg/dL Final  . Total Protein 04/14/2016 7.7  6.5 - 8.1 g/dL Final  . Albumin 04/14/2016 4.7  3.5 - 5.0 g/dL Final  . AST 04/14/2016 42* 15 - 41 U/L Final  . ALT 04/14/2016 39  14 - 54 U/L Final  . Alkaline Phosphatase 04/14/2016 129* 38 - 126 U/L Final  . Total Bilirubin 04/14/2016 1.7* 0.3 - 1.2 mg/dL Final  . GFR calc non Af Amer  04/14/2016 >60  >60 mL/min Final  . GFR calc Af Amer 04/14/2016 >60  >60 mL/min Final   Comment: (NOTE) The eGFR has been calculated using the CKD EPI equation. This calculation has not been validated in all clinical situations. eGFR's persistently <60 mL/min signify possible Chronic Kidney Disease.   . Anion gap 04/14/2016 6  5 - 15 Final  . Prothrombin Time 04/14/2016 13.8  11.4 - 15.2 seconds Final  . INR 04/14/2016 1.06   Final    Assessment:  Jessica Cline is a 51 y.o. female with cirrhosis and associated moderate thrombocytopenia and a lupus anticoagulant.  Platelet count fluctuates between 36,000 and 50,000.  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    Work-up on 04/10/2014 revealed a platelet count in a blue top tube was 46,000 (rule out pseudothrombocytopenia).  Negative labs included an ANA, ferritin, iron studies, B12, folate, SPEP (apparent polyclonal gammopathy), UPEP, hepatitis B surface antigen, hepatitis C RNA, and HIV testing.  Fibrinogen was 207 (210-470).  PTT was elevated at 45.3 (23.6-35.9) suggestive of a possible factor deficiency or inhibitor.  Additional testing on 04/20/2014 confirmed a lupus anticoagulant.  Lupus anticoagulant testing was positive again on 01/25/2015.  Abdominal ultrasound on 04/18/2014 revealed a prominent left hepatic lobe with mild nodularity and splenomegaly (16.9 cm) suggestive of possible cirrhosis.  Patient denies any alcohol use (last 10 years ago).  EGD in 03/2014 was normal per patient's report.  She received the hepatitis B vaccine series.  She is considering a liver transplant.    AFP has been followed:  4.9 on 04/17/2015, 4.1 on 08/07/2015, and 5.0 on 04/14/2016.  Chest CT on 06/01/2014 revealed an abnormal attenuation in the right lobe of the liver for which more definitive characterization by hepatic MRI was suggested to exclude malignancy.  She notes a liver MRI in [facility] this year.    She presented with a vaginal  hemorrhage on 01/05/2015.  She required 3 units of PRBCs and 1 unit pheresed platelets.  She had an severe allergic reaction to platelets.  She will require premedications of Benadryl +/- steroids.  PT was 13.1 (INR 1.18) and PTT was 28.3. Fibrinogen was 158 (208-409).   Von Willebrand testing was negative.  Platelet function assay was c/w aspirin effect.  She is being considered for hysterectomy versus uterine ablation.  She will likely have a uterine biopsy in the near future.  She has had no further heavy menses.  Symptomatically, she is doing "ok".  She denies any bruising or bleeding. Platelet count is 52,000.  Bilirubin is 1.7.  INR is 1.06.  Plan: 1.  Labs today:  CBC with diff, CMP, PT/INR, AFP. 2.  Schedule RUQ abdominal ultrasound. 3.  Patient to follow-up with transplant team. 4.  RTC in 3 months for MD assessment, labs (CBC with diff, CMP, PT/INR).   Lequita Asal, MD  04/14/2016,  3:13 PM

## 2016-04-15 LAB — AFP TUMOR MARKER: AFP-Tumor Marker: 5 ng/mL (ref 0.0–8.3)

## 2016-07-13 NOTE — Progress Notes (Signed)
[facility]-  Cancer Center  Clinic day:  07/14/16   Chief Complaint: Jessica Cline is a 51 y.o. female with cirrhosis, thrombocytopenia, and a lupus anticoagulant who is seen for 3 month assessment.  HPI: The patient was last seen in the medical oncology clinic on 04/14/2016.  At that time, she was doing "ok".  She denied any bruising or bleeding. Platelet count was 52,000.  Bilirubin was 1.7.  INR was 1.06.  She was planning on assessment by the transplant team.  She states that she is "living day to day".  She feels like she is going through menopause.  She has some sweats at night.  She had 2 days of vaginal bleeding.  She denies any melena or hematochezia.   Past Medical History:  Diagnosis Date  . Arthritis   . Blood transfusion without reported diagnosis     History reviewed. No pertinent surgical history.  Family History  Problem Relation Age of Onset  . Cancer Father   . Cancer Maternal Grandmother     Social History:  reports that she has never smoked. Her smokeless tobacco use includes Snuff. She reports that she uses drugs, including Marijuana. Her alcohol history is not on file.  She cashed in her insurance policy.  Patient's contact number is [phone]. The patient is alone today.  Allergies:  Allergies  Allergen Reactions  . Plasma, Human Swelling    Current Medications: Current Outpatient Prescriptions  Medication Sig Dispense Refill  . acetaminophen (TYLENOL) 325 MG tablet Take 650 mg by mouth every 6 (six) hours as needed.    Marland Kitchen albuterol (PROVENTIL HFA;VENTOLIN HFA) 108 (90 Base) MCG/ACT inhaler Inhale into the lungs. Reported on 08/07/2015    . amLODipine (NORVASC) 10 MG tablet     . celecoxib (CELEBREX) 100 MG capsule Take 100 mg by mouth daily.    Marland Kitchen desmopressin (DDAVP) 0.01 % SOLN     . sertraline (ZOLOFT) 100 MG tablet     . traZODone (DESYREL) 100 MG tablet     . clonazePAM (KLONOPIN) 1 MG tablet      No current  facility-administered medications for this visit.     Review of Systems:  GENERAL:  Feels "good".  No fevers or sweats.  Weight up 6 pounds. PERFORMANCE STATUS (ECOG):  1 HEENT:  No visual changes, runny nose, sore throat, mouth sores or tenderness. Lungs: No shortness of breath or cough.  No hemoptysis. Cardiac:  No chest pain, palpitations, orthopnea, or PND. GI:  Eating well.   No nausea, vomiting, diarrhea, constipation, melena or hematochezia. GU:  No urgency, frequency, dysuria, or hematuria.  2 days of vaginal bleeding (not heavy). Musculoskeletal:  No back pain.  No joint pain.  No muscle tenderness. Extremities:  No pain or swelling. Skin:  No rashes or skin changes. Neuro:  No headache, numbness or weakness, balance or coordination issues. Endocrine:  No diabetes, thyroid issues, hot flashes.  Night sweats.   Psych:  No mood changes, depression or anxiety. Pain:  No focal pain. Review of systems:  All other systems reviewed and found to be negative.  Physical Exam: Blood pressure (!) 153/85, pulse 80, temperature 97.5 F (36.4 C), temperature source Tympanic, resp. rate 18, weight 236 lb 6 oz (107.2 kg). GENERAL:  Well developed, well nourished, woman sitting comfortably in the exam room in no acute distress. MENTAL STATUS:  Alert and oriented to person, place and time. HEAD:  Shoulder length gray hair.  Normocephalic, atraumatic, face symmetric,  no Cushingoid features. EYES:  Blue eyes.  Pupils equal round and reactive to light and accomodation.  No conjunctivitis or scleral icterus. ENT:  Oropharynx clear without lesion.  Tongue normal. Mucous membranes moist.  RESPIRATORY:  Clear to auscultation without rales, wheezes or rhonchi. CARDIOVASCULAR:  Regular rate and rhythm without murmur, rub or gallop. ABDOMEN:  Soft, non-tender, with active bowel sounds, and no hepatomegaly.  Spleen palpable.  No masses. SKIN:  No rashes, ulcers or lesions.  No petechiae or  ecchymosis. EXTREMITIES: No edema, no skin discoloration or tenderness.  No palpable cords. LYMPH NODES: No palpable cervical, supraclavicular, axillary or inguinal adenopathy  NEUROLOGICAL: Unremarkable. PSYCH:  Appropriate.   Appointment on 07/14/2016  Component Date Value Ref Range Status  . WBC 07/14/2016 5.1  3.6 - 11.0 K/uL Final  . RBC 07/14/2016 5.19  3.80 - 5.20 MIL/uL Final  . Hemoglobin 07/14/2016 14.7  12.0 - 16.0 g/dL Final  . HCT 07/14/2016 43.3  35.0 - 47.0 % Final  . MCV 07/14/2016 83.4  80.0 - 100.0 fL Final  . MCH 07/14/2016 28.3  26.0 - 34.0 pg Final  . MCHC 07/14/2016 33.9  32.0 - 36.0 g/dL Final  . RDW 07/14/2016 16.4* 11.5 - 14.5 % Final  . Platelets 07/14/2016 55* 150 - 440 K/uL Final  . Neutrophils Relative % 07/14/2016 72  % Final  . Neutro Abs 07/14/2016 3.6  1.4 - 6.5 K/uL Final  . Lymphocytes Relative 07/14/2016 17  % Final  . Lymphs Abs 07/14/2016 0.9* 1.0 - 3.6 K/uL Final  . Monocytes Relative 07/14/2016 7  % Final  . Monocytes Absolute 07/14/2016 0.4  0.2 - 0.9 K/uL Final  . Eosinophils Relative 07/14/2016 3  % Final  . Eosinophils Absolute 07/14/2016 0.2  0 - 0.7 K/uL Final  . Basophils Relative 07/14/2016 1  % Final  . Basophils Absolute 07/14/2016 0.0  0 - 0.1 K/uL Final  . Sodium 07/14/2016 138  135 - 145 mmol/L Final  . Potassium 07/14/2016 4.0  3.5 - 5.1 mmol/L Final  . Chloride 07/14/2016 104  101 - 111 mmol/L Final  . CO2 07/14/2016 26  22 - 32 mmol/L Final  . Glucose, Bld 07/14/2016 146* 65 - 99 mg/dL Final  . BUN 07/14/2016 15  6 - 20 mg/dL Final  . Creatinine, Ser 07/14/2016 0.83  0.44 - 1.00 mg/dL Final  . Calcium 07/14/2016 9.5  8.9 - 10.3 mg/dL Final  . Total Protein 07/14/2016 7.5  6.5 - 8.1 g/dL Final  . Albumin 07/14/2016 4.5  3.5 - 5.0 g/dL Final  . AST 07/14/2016 47* 15 - 41 U/L Final  . ALT 07/14/2016 45  14 - 54 U/L Final  . Alkaline Phosphatase 07/14/2016 129* 38 - 126 U/L Final  . Total Bilirubin 07/14/2016 2.5* 0.3 - 1.2  mg/dL Final  . GFR calc non Af Amer 07/14/2016 >60  >60 mL/min Final  . GFR calc Af Amer 07/14/2016 >60  >60 mL/min Final   Comment: (NOTE) The eGFR has been calculated using the CKD EPI equation. This calculation has not been validated in all clinical situations. eGFR's persistently <60 mL/min signify possible Chronic Kidney Disease.   . Anion gap 07/14/2016 8  5 - 15 Final    Assessment:  Jessica Cline is a 51 y.o. female with cirrhosis and associated moderate thrombocytopenia and a lupus anticoagulant.  Platelet count fluctuates between 36,000 and 50,000.  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    Work-up  on 04/10/2014 revealed a platelet count in a blue top tube was 46,000 (rule out pseudothrombocytopenia).  Negative labs included an ANA, ferritin, iron studies, B12, folate, SPEP (apparent polyclonal gammopathy), UPEP, hepatitis B surface antigen, hepatitis C RNA, and HIV testing.  Fibrinogen was 207 (210-470).  PTT was elevated at 45.3 (23.6-35.9) suggestive of a possible factor deficiency or inhibitor.  Additional testing on 04/20/2014 confirmed a lupus anticoagulant.  Lupus anticoagulant testing was positive again on 01/25/2015.  Abdominal ultrasound on 04/18/2014 revealed a prominent left hepatic lobe with mild nodularity and splenomegaly (16.9 cm) suggestive of possible cirrhosis.  Patient denies any alcohol use (last 10 years ago).  EGD in 03/2014 was normal per patient's report.  She received the hepatitis B vaccine series.  She is considering a liver transplant.    AFP has been followed:  4.9 on 04/17/2015, 4.1 on 08/07/2015, and 5.0 on 04/14/2016.  Chest CT on 06/01/2014 revealed an abnormal attenuation in the right lobe of the liver for which more definitive characterization by hepatic MRI was suggested to exclude malignancy.  She notes a liver MRI in [facility] this year.  RUQ ultrasound on 04/02/2016 revealed cirrhotic changes within the liver without discrete  hepatic mass.  She presented with a vaginal hemorrhage on 01/05/2015.  She required 3 units of PRBCs and 1 unit pheresed platelets.  She had an severe allergic reaction to platelets.  She will require premedications of Benadryl +/- steroids.  PT was 13.1 (INR 1.18) and PTT was 28.3. Fibrinogen was 158 (208-409).   Von Willebrand testing was negative.  Platelet function assay was c/w aspirin effect.  She is being considered for hysterectomy versus uterine ablation.  She will likely have a uterine biopsy in the near future.  She has had no further heavy menses.  Symptomatically, she denies any complaint.  She denies any bruising or bleeding.  Platelet count is 55,000.  Bilirubin is 2.5.  INR is 1.10.  Plan: 1.  Labs today:  CBC with diff, CMP, PT/INR. 2.  RUQ ultrasound on 09/01/2016. 3.  Encourage patient to follow-up with transplant team. 4.  RTC in 3 months for MD assessment, labs (CBC with diff, CMP, PT/INR, AFP).   Lequita Asal, MD  07/14/2016,  3:00 PM

## 2016-07-14 ENCOUNTER — Inpatient Hospital Stay: Payer: Medicare Other | Attending: Hematology and Oncology | Admitting: Hematology and Oncology

## 2016-07-14 ENCOUNTER — Inpatient Hospital Stay: Payer: Medicare Other

## 2016-07-14 ENCOUNTER — Other Ambulatory Visit: Payer: Self-pay | Admitting: Hematology and Oncology

## 2016-07-14 ENCOUNTER — Encounter: Payer: Self-pay | Admitting: Hematology and Oncology

## 2016-07-14 VITALS — BP 130/80 | HR 69 | Temp 97.5°F | Resp 18 | Wt 236.4 lb

## 2016-07-14 DIAGNOSIS — K7469 Other cirrhosis of liver: Secondary | ICD-10-CM

## 2016-07-14 DIAGNOSIS — M129 Arthropathy, unspecified: Secondary | ICD-10-CM | POA: Insufficient documentation

## 2016-07-14 DIAGNOSIS — R161 Splenomegaly, not elsewhere classified: Secondary | ICD-10-CM

## 2016-07-14 DIAGNOSIS — R61 Generalized hyperhidrosis: Secondary | ICD-10-CM

## 2016-07-14 DIAGNOSIS — D696 Thrombocytopenia, unspecified: Secondary | ICD-10-CM

## 2016-07-14 DIAGNOSIS — K7460 Unspecified cirrhosis of liver: Secondary | ICD-10-CM | POA: Diagnosis not present

## 2016-07-14 DIAGNOSIS — R760 Raised antibody titer: Secondary | ICD-10-CM

## 2016-07-14 DIAGNOSIS — Z808 Family history of malignant neoplasm of other organs or systems: Secondary | ICD-10-CM

## 2016-07-14 DIAGNOSIS — D6862 Lupus anticoagulant syndrome: Secondary | ICD-10-CM | POA: Diagnosis not present

## 2016-07-14 DIAGNOSIS — F17290 Nicotine dependence, other tobacco product, uncomplicated: Secondary | ICD-10-CM | POA: Diagnosis not present

## 2016-07-14 DIAGNOSIS — Z79899 Other long term (current) drug therapy: Secondary | ICD-10-CM | POA: Diagnosis not present

## 2016-07-14 LAB — COMPREHENSIVE METABOLIC PANEL
ALT: 45 U/L (ref 14–54)
AST: 47 U/L — ABNORMAL HIGH (ref 15–41)
Albumin: 4.5 g/dL (ref 3.5–5.0)
Alkaline Phosphatase: 129 U/L — ABNORMAL HIGH (ref 38–126)
Anion gap: 8 (ref 5–15)
BUN: 15 mg/dL (ref 6–20)
CO2: 26 mmol/L (ref 22–32)
Calcium: 9.5 mg/dL (ref 8.9–10.3)
Chloride: 104 mmol/L (ref 101–111)
Creatinine, Ser: 0.83 mg/dL (ref 0.44–1.00)
GFR calc Af Amer: >60 mL/min (ref >60)
GFR calc non Af Amer: >60 mL/min (ref >60)
Glucose, Bld: 146 mg/dL — ABNORMAL HIGH (ref 65–99)
Potassium: 4 mmol/L (ref 3.5–5.1)
Sodium: 138 mmol/L (ref 135–145)
Total Bilirubin: 2.5 mg/dL — ABNORMAL HIGH (ref 0.3–1.2)
Total Protein: 7.5 g/dL (ref 6.5–8.1)

## 2016-07-14 LAB — CBC WITH DIFFERENTIAL/PLATELET
Basophils Absolute: 0 10*3/uL (ref 0–0.1)
Basophils Relative: 1 %
Eosinophils Absolute: 0.2 10*3/uL (ref 0–0.7)
Eosinophils Relative: 3 %
HCT: 43.3 % (ref 35.0–47.0)
Hemoglobin: 14.7 g/dL (ref 12.0–16.0)
Lymphocytes Relative: 17 %
Lymphs Abs: 0.9 10*3/uL — ABNORMAL LOW (ref 1.0–3.6)
MCH: 28.3 pg (ref 26.0–34.0)
MCHC: 33.9 g/dL (ref 32.0–36.0)
MCV: 83.4 fL (ref 80.0–100.0)
Monocytes Absolute: 0.4 10*3/uL (ref 0.2–0.9)
Monocytes Relative: 7 %
Neutro Abs: 3.6 10*3/uL (ref 1.4–6.5)
Neutrophils Relative %: 72 %
Platelets: 55 10*3/uL — ABNORMAL LOW (ref 150–440)
RBC: 5.19 MIL/uL (ref 3.80–5.20)
RDW: 16.4 % — ABNORMAL HIGH (ref 11.5–14.5)
WBC: 5.1 10*3/uL (ref 3.6–11.0)

## 2016-07-14 LAB — PROTIME-INR
INR: 1.1
Prothrombin Time: 14.2 seconds (ref 11.4–15.2)

## 2016-07-14 NOTE — Progress Notes (Signed)
Patient offers no complaints today. 

## 2016-09-01 ENCOUNTER — Ambulatory Visit: Payer: Medicare Other

## 2016-10-12 NOTE — Progress Notes (Signed)
[facility]-  Cancer Center  Clinic day:  10/13/16   Chief Complaint: Jessica Cline is a 51 y.o. female with cirrhosis, thrombocytopenia, and a lupus anticoagulant who is seen for 3 month assessment.  HPI: The patient was last seen in the medical oncology clinic on 07/14/2016.  At that time, she was "living day to day".  She felt like she was going through menopause.  She had some sweats at night.  She noted 2 days of vaginal bleeding.  She denied any melena or hematochezia. CBC revealed a hematocrit of 43.3, hemoglobin 14.7, MCV 83.4 was 55,000 and white count 5100.  Liver function test included a alkaline phosphatase of 129, albumin 4.5, AST 47, ALT 45 and bilirubin 2.5.  INR was 1.1.  She was scheduled to have a follow-up RUQ ultrasound (not performed secondary to costs).  She states that she "wants to stay out of the hole"  Symptomatically, she notes interval episode of oral bleeding (brief).  She has had no vaginal bleeding in 6-7 months.  She has some hot flashes.   Past Medical History:  Diagnosis Date  . Arthritis   . Blood transfusion without reported diagnosis     History reviewed. No pertinent surgical history.  Family History  Problem Relation Age of Onset  . Cancer Father   . Cancer Maternal Grandmother     Social History:  reports that she has never smoked. Her smokeless tobacco use includes Snuff. She reports that she uses drugs, including Marijuana. Her alcohol history is not on file.  She cashed in her insurance policy.  Patient's contact number is [phone]. The patient is alone today.  Allergies:  Allergies  Allergen Reactions  . Plasma, Human Swelling    Current Medications: Current Outpatient Prescriptions  Medication Sig Dispense Refill  . acetaminophen (TYLENOL) 325 MG tablet Take 650 mg by mouth every 6 (six) hours as needed.    Marland Kitchen. albuterol (PROVENTIL HFA;VENTOLIN HFA) 108 (90 Base) MCG/ACT inhaler Inhale into the lungs. Reported  on 08/07/2015    . amLODipine (NORVASC) 10 MG tablet     . celecoxib (CELEBREX) 100 MG capsule Take 100 mg by mouth daily.    . clonazePAM (KLONOPIN) 1 MG tablet     . desmopressin (DDAVP) 0.01 % SOLN     . sertraline (ZOLOFT) 100 MG tablet     . traZODone (DESYREL) 100 MG tablet      No current facility-administered medications for this visit.     Review of Systems:  GENERAL:  Feels "good".  No fevers or sweats.  Weight up 8 pounds. PERFORMANCE STATUS (ECOG):  1 HEENT:  No visual changes, runny nose, sore throat, mouth sores or tenderness. Lungs: No shortness of breath or cough.  No hemoptysis. Cardiac:  No chest pain, palpitations, orthopnea, or PND. GI:  Eating well.   No nausea, vomiting, diarrhea, constipation, melena or hematochezia. GU:  No urgency, frequency, dysuria, or hematuria.  No vaginal bleeding in 6-7 months. Musculoskeletal:  No back pain.  No joint pain.  No muscle tenderness. Extremities:  No pain or swelling. Skin:  No rashes or skin changes. Neuro:  No headache, numbness or weakness, balance or coordination issues. Endocrine:  No diabetes, thyroid issues, hot flashes.  Night sweats.   Psych:  No mood changes, depression or anxiety. Pain:  No focal pain. Review of systems:  All other systems reviewed and found to be negative.  Physical Exam: Blood pressure (!) 142/85, pulse 74, temperature 97.8 F (  36.6 C), temperature source Tympanic, resp. rate 20, weight 244 lb 9 oz (110.9 kg). GENERAL:  Well developed, well nourished, woman sitting comfortably in the exam room in no acute distress. MENTAL STATUS:  Alert and oriented to person, place and time. HEAD:  Shoulder length gray hair.  Normocephalic, atraumatic, face symmetric, no Cushingoid features. EYES:  Glasses propped up on head.  Blue eyes.  Pupils equal round and reactive to light and accomodation.  No conjunctivitis or scleral icterus. ENT:  Oropharynx clear without lesion.  Tongue normal. Mucous membranes  moist.  RESPIRATORY:  Clear to auscultation without rales, wheezes or rhonchi. CARDIOVASCULAR:  Regular rate and rhythm without murmur, rub or gallop. ABDOMEN:  Soft, non-tender, with active bowel sounds, and no hepatomegaly.  Spleen palpable.  No masses. SKIN:  No rashes, ulcers or lesions.  No petechiae or ecchymosis. EXTREMITIES: No edema, no skin discoloration or tenderness.  No palpable cords. LYMPH NODES: No palpable cervical, supraclavicular, axillary or inguinal adenopathy  NEUROLOGICAL: Unremarkable. PSYCH:  Appropriate.   Appointment on 10/13/2016  Component Date Value Ref Range Status  . WBC 10/13/2016 5.6  3.6 - 11.0 K/uL Final  . RBC 10/13/2016 4.92  3.80 - 5.20 MIL/uL Final  . Hemoglobin 10/13/2016 14.3  12.0 - 16.0 g/dL Final  . HCT 27/25/3664 41.5  35.0 - 47.0 % Final  . MCV 10/13/2016 84.5  80.0 - 100.0 fL Final  . MCH 10/13/2016 29.0  26.0 - 34.0 pg Final  . MCHC 10/13/2016 34.3  32.0 - 36.0 g/dL Final  . RDW 40/34/7425 15.3* 11.5 - 14.5 % Final  . Platelets 10/13/2016 53* 150 - 440 K/uL Final  . Neutrophils Relative % 10/13/2016 70  % Final  . Neutro Abs 10/13/2016 4.0  1.4 - 6.5 K/uL Final  . Lymphocytes Relative 10/13/2016 19  % Final  . Lymphs Abs 10/13/2016 1.0  1.0 - 3.6 K/uL Final  . Monocytes Relative 10/13/2016 7  % Final  . Monocytes Absolute 10/13/2016 0.4  0.2 - 0.9 K/uL Final  . Eosinophils Relative 10/13/2016 3  % Final  . Eosinophils Absolute 10/13/2016 0.2  0 - 0.7 K/uL Final  . Basophils Relative 10/13/2016 1  % Final  . Basophils Absolute 10/13/2016 0.0  0 - 0.1 K/uL Final    Assessment:  Jessica Cline is a 51 y.o. female with cirrhosis and associated moderate thrombocytopenia and a lupus anticoagulant.  Platelet count fluctuates between 36,000 and 50,000.  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    Work-up on 04/10/2014 revealed a platelet count in a blue top tube was 46,000 (rule out pseudothrombocytopenia).  Negative labs  included an ANA, ferritin, iron studies, B12, folate, SPEP (apparent polyclonal gammopathy), UPEP, hepatitis B surface antigen, hepatitis C RNA, and HIV testing.  Fibrinogen was 207 (210-470).  PTT was elevated at 45.3 (23.6-35.9) suggestive of a possible factor deficiency or inhibitor.  Additional testing on 04/20/2014 confirmed a lupus anticoagulant.  Lupus anticoagulant testing was positive again on 01/25/2015.  Abdominal ultrasound on 04/18/2014 revealed a prominent left hepatic lobe with mild nodularity and splenomegaly (16.9 cm) suggestive of possible cirrhosis.  Patient denies any alcohol use (last 10 years ago).  EGD in 03/2014 was normal per patient's report.  She received the hepatitis B vaccine series.  She is considering a liver transplant.    AFP has been followed:  4.9 on 04/17/2015, 4.1 on 08/07/2015, 5.0 on 04/14/2016, and 5.2 on 10/13/2016.  Chest CT on 06/01/2014 revealed an  abnormal attenuation in the right lobe of the liver for which more definitive characterization by hepatic MRI was suggested to exclude malignancy.  She notes a liver MRI in [facility] this year.  RUQ ultrasound on 04/02/2016 revealed cirrhotic changes within the liver without discrete hepatic mass.  She presented with a vaginal hemorrhage on 01/05/2015.  She required 3 units of PRBCs and 1 unit pheresed platelets.  She had an severe allergic reaction to platelets.  She will require premedications of Benadryl +/- steroids.  PT was 13.1 (INR 1.18) and PTT was 28.3. Fibrinogen was 158 (208-409).   Von Willebrand testing was negative.  Platelet function assay was c/w aspirin effect.  She is being considered for hysterectomy versus uterine ablation.  She will likely have a uterine biopsy in the near future.  She has had no further heavy menses.  Symptomatically, she denies any complaint.  She denies any bruising or bleeding.  Platelet count is 53,000 (stable).  Bilirubin is 2.1.  Plan: 1.  Labs today:  CBC with diff,  LFTs, direct bilirubin, PT/INR, AFP. 2.  Reschedule RUQ ultrasound. 3.  Encourage patient to follow-up with transplant team. 4.  RTC in 6 months for MD assessment, labs (CBC with diff, CMP, direct bilirubin, AFP).   Rosey Bath, MD  10/13/2016,  2:46 PM

## 2016-10-13 ENCOUNTER — Inpatient Hospital Stay (HOSPITAL_BASED_OUTPATIENT_CLINIC_OR_DEPARTMENT_OTHER): Payer: Medicare Other | Admitting: Hematology and Oncology

## 2016-10-13 ENCOUNTER — Inpatient Hospital Stay: Payer: Medicare Other | Attending: Hematology and Oncology

## 2016-10-13 ENCOUNTER — Encounter: Payer: Self-pay | Admitting: Hematology and Oncology

## 2016-10-13 VITALS — BP 142/85 | HR 74 | Temp 97.8°F | Resp 20 | Wt 244.6 lb

## 2016-10-13 DIAGNOSIS — D6862 Lupus anticoagulant syndrome: Secondary | ICD-10-CM

## 2016-10-13 DIAGNOSIS — R61 Generalized hyperhidrosis: Secondary | ICD-10-CM | POA: Insufficient documentation

## 2016-10-13 DIAGNOSIS — Z79899 Other long term (current) drug therapy: Secondary | ICD-10-CM

## 2016-10-13 DIAGNOSIS — R161 Splenomegaly, not elsewhere classified: Secondary | ICD-10-CM | POA: Diagnosis not present

## 2016-10-13 DIAGNOSIS — F17290 Nicotine dependence, other tobacco product, uncomplicated: Secondary | ICD-10-CM

## 2016-10-13 DIAGNOSIS — M129 Arthropathy, unspecified: Secondary | ICD-10-CM | POA: Diagnosis not present

## 2016-10-13 DIAGNOSIS — K7460 Unspecified cirrhosis of liver: Secondary | ICD-10-CM | POA: Diagnosis not present

## 2016-10-13 DIAGNOSIS — D696 Thrombocytopenia, unspecified: Secondary | ICD-10-CM

## 2016-10-13 DIAGNOSIS — R232 Flushing: Secondary | ICD-10-CM | POA: Insufficient documentation

## 2016-10-13 DIAGNOSIS — K7469 Other cirrhosis of liver: Secondary | ICD-10-CM

## 2016-10-13 DIAGNOSIS — Z809 Family history of malignant neoplasm, unspecified: Secondary | ICD-10-CM

## 2016-10-13 LAB — CBC WITH DIFFERENTIAL/PLATELET
Basophils Absolute: 0 10*3/uL (ref 0–0.1)
Basophils Relative: 1 %
Eosinophils Absolute: 0.2 10*3/uL (ref 0–0.7)
Eosinophils Relative: 3 %
HCT: 41.5 % (ref 35.0–47.0)
Hemoglobin: 14.3 g/dL (ref 12.0–16.0)
Lymphocytes Relative: 19 %
Lymphs Abs: 1 10*3/uL (ref 1.0–3.6)
MCH: 29 pg (ref 26.0–34.0)
MCHC: 34.3 g/dL (ref 32.0–36.0)
MCV: 84.5 fL (ref 80.0–100.0)
Monocytes Absolute: 0.4 10*3/uL (ref 0.2–0.9)
Monocytes Relative: 7 %
Neutro Abs: 4 10*3/uL (ref 1.4–6.5)
Neutrophils Relative %: 70 %
Platelets: 53 10*3/uL — ABNORMAL LOW (ref 150–440)
RBC: 4.92 MIL/uL (ref 3.80–5.20)
RDW: 15.3 % — ABNORMAL HIGH (ref 11.5–14.5)
WBC: 5.6 10*3/uL (ref 3.6–11.0)

## 2016-10-13 LAB — HEPATIC FUNCTION PANEL
ALT: 42 U/L (ref 14–54)
AST: 50 U/L — ABNORMAL HIGH (ref 15–41)
Albumin: 4.3 g/dL (ref 3.5–5.0)
Alkaline Phosphatase: 116 U/L (ref 38–126)
Bilirubin, Direct: 0.4 mg/dL (ref 0.1–0.5)
Indirect Bilirubin: 1.7 mg/dL — ABNORMAL HIGH (ref 0.3–0.9)
Total Bilirubin: 2.1 mg/dL — ABNORMAL HIGH (ref 0.3–1.2)
Total Protein: 7.2 g/dL (ref 6.5–8.1)

## 2016-10-13 LAB — PROTIME-INR
INR: 1.1
Prothrombin Time: 14.2 seconds (ref 11.4–15.2)

## 2016-10-13 NOTE — Progress Notes (Signed)
Patient offers no complaints today. 

## 2016-10-14 LAB — AFP TUMOR MARKER: AFP, Serum, Tumor Marker: 5.2 ng/mL (ref 0.0–8.3)

## 2016-10-16 ENCOUNTER — Ambulatory Visit: Payer: Medicare Other

## 2016-10-24 ENCOUNTER — Ambulatory Visit
Admission: RE | Admit: 2016-10-24 | Discharge: 2016-10-24 | Disposition: A | Discharge status: Home / Self Care | Payer: Medicare Other | Source: Ambulatory Visit | Attending: Hematology and Oncology | Admitting: Hematology and Oncology

## 2016-10-24 DIAGNOSIS — K7469 Other cirrhosis of liver: Secondary | ICD-10-CM

## 2016-10-24 DIAGNOSIS — D696 Thrombocytopenia, unspecified: Secondary | ICD-10-CM

## 2016-10-24 DIAGNOSIS — R161 Splenomegaly, not elsewhere classified: Secondary | ICD-10-CM

## 2016-10-24 DIAGNOSIS — K769 Liver disease, unspecified: Secondary | ICD-10-CM | POA: Insufficient documentation

## 2017-04-16 ENCOUNTER — Other Ambulatory Visit: Payer: Self-pay

## 2017-04-16 ENCOUNTER — Encounter: Payer: Self-pay | Admitting: Hematology and Oncology

## 2017-04-16 ENCOUNTER — Inpatient Hospital Stay: Payer: Medicare Other | Attending: Hematology and Oncology

## 2017-04-16 ENCOUNTER — Inpatient Hospital Stay (HOSPITAL_BASED_OUTPATIENT_CLINIC_OR_DEPARTMENT_OTHER): Payer: Medicare Other | Admitting: Hematology and Oncology

## 2017-04-16 VITALS — BP 134/87 | HR 73 | Temp 97.8°F | Resp 12 | Ht 66.0 in | Wt 251.6 lb

## 2017-04-16 DIAGNOSIS — D6862 Lupus anticoagulant syndrome: Secondary | ICD-10-CM | POA: Diagnosis not present

## 2017-04-16 DIAGNOSIS — K7460 Unspecified cirrhosis of liver: Secondary | ICD-10-CM

## 2017-04-16 DIAGNOSIS — K7469 Other cirrhosis of liver: Secondary | ICD-10-CM

## 2017-04-16 DIAGNOSIS — R161 Splenomegaly, not elsewhere classified: Secondary | ICD-10-CM

## 2017-04-16 DIAGNOSIS — D696 Thrombocytopenia, unspecified: Secondary | ICD-10-CM | POA: Insufficient documentation

## 2017-04-16 DIAGNOSIS — R760 Raised antibody titer: Secondary | ICD-10-CM

## 2017-04-16 LAB — CBC WITH DIFFERENTIAL/PLATELET
Basophils Absolute: 0 10*3/uL (ref 0–0.1)
Basophils Relative: 1 %
Eosinophils Absolute: 0.1 10*3/uL (ref 0–0.7)
Eosinophils Relative: 3 %
HCT: 45 % (ref 35.0–47.0)
Hemoglobin: 15.1 g/dL (ref 12.0–16.0)
Lymphocytes Relative: 21 %
Lymphs Abs: 1 10*3/uL (ref 1.0–3.6)
MCH: 28.9 pg (ref 26.0–34.0)
MCHC: 33.5 g/dL (ref 32.0–36.0)
MCV: 86.1 fL (ref 80.0–100.0)
Monocytes Absolute: 0.3 10*3/uL (ref 0.2–0.9)
Monocytes Relative: 7 %
Neutro Abs: 3.2 10*3/uL (ref 1.4–6.5)
Neutrophils Relative %: 68 %
Platelets: 49 10*3/uL — ABNORMAL LOW (ref 150–440)
RBC: 5.23 MIL/uL — ABNORMAL HIGH (ref 3.80–5.20)
RDW: 15.4 % — ABNORMAL HIGH (ref 11.5–14.5)
WBC: 4.7 10*3/uL (ref 3.6–11.0)

## 2017-04-16 LAB — COMPREHENSIVE METABOLIC PANEL
ALT: 43 U/L (ref 14–54)
AST: 48 U/L — ABNORMAL HIGH (ref 15–41)
Albumin: 4.4 g/dL (ref 3.5–5.0)
Alkaline Phosphatase: 111 U/L (ref 38–126)
Anion gap: 10 (ref 5–15)
BUN: 13 mg/dL (ref 6–20)
CO2: 27 mmol/L (ref 22–32)
Calcium: 9.5 mg/dL (ref 8.9–10.3)
Chloride: 103 mmol/L (ref 101–111)
Creatinine, Ser: 0.83 mg/dL (ref 0.44–1.00)
GFR calc Af Amer: >60 mL/min (ref >60)
GFR calc non Af Amer: >60 mL/min (ref >60)
Glucose, Bld: 147 mg/dL — ABNORMAL HIGH (ref 65–99)
Potassium: 4.3 mmol/L (ref 3.5–5.1)
Sodium: 140 mmol/L (ref 135–145)
Total Bilirubin: 1.8 mg/dL — ABNORMAL HIGH (ref 0.3–1.2)
Total Protein: 7.5 g/dL (ref 6.5–8.1)

## 2017-04-16 LAB — BILIRUBIN, DIRECT: Bilirubin, Direct: 0.4 mg/dL (ref 0.1–0.5)

## 2017-04-16 NOTE — Progress Notes (Signed)
[place] Clinic day:  04/16/17   Chief Complaint: Jessica Cline is a 52 y.o. female with cirrhosis, thrombocytopenia, and a lupus anticoagulant who is seen for 6 month assessment.  HPI: The patient was last seen in the medical oncology clinic on 10/13/2016.  At that time, she denied any complaint.  She denied any bruising or bleeding.  Platelet count was 53,000 (stable).  Bilirubin was 2.1.  AFP was 5.2.  RUQ ultrasound on 10/24/2016 revealed a stable appearance of the liver from the prior ultrasound.  There was evidence consistent with hepatic cirrhosis.  There were no focal lesions.  During the interim, patient states, "I am still kicking". Patient denies any acute physical concerns today. Patient denies bleeding; no hematochezia or melena. She has insignificant amounts of vaginal bleeding that happen on an infrequent basis.  Patient has infrequent epistaxis events. She denies any areas of unexplained bruising. She has been having nausea as of late. Patient is eating well. Her weight is up 7 pounds.   She is no longer pursing a liver transplant. Patient states, "there are people out there with family who needs it worse than me". Patient is no longer drinking alcohol. She states, "I quit drinking alcohol when I was in my early 46s".  She denies pain in the clinic today.    Past Medical History:  Diagnosis Date  . Arthritis   . Blood transfusion without reported diagnosis     History reviewed. No pertinent surgical history.  Family History  Problem Relation Age of Onset  . Cancer Father   . Cancer Maternal Grandmother     Social History:  reports that  has never smoked. Her smokeless tobacco use includes snuff. She reports that she uses drugs. Drug: Marijuana. Her alcohol history is not on file.  She cashed in her insurance policy.  Patient's contact number is [phone]. She lives in [place].  The patient is alone today.  Allergies:   Allergies  Allergen Reactions  . Plasma, Human Swelling    Current Medications: Current Outpatient Medications  Medication Sig Dispense Refill  . acetaminophen (TYLENOL) 325 MG tablet Take 650 mg by mouth every 6 (six) hours as needed.    Marland Kitchen albuterol (PROVENTIL HFA;VENTOLIN HFA) 108 (90 Base) MCG/ACT inhaler Inhale into the lungs. Reported on 08/07/2015    . amLODipine (NORVASC) 10 MG tablet     . celecoxib (CELEBREX) 100 MG capsule Take 100 mg by mouth daily.    Marland Kitchen desmopressin (DDAVP) 0.01 % SOLN     . sertraline (ZOLOFT) 100 MG tablet     . traZODone (DESYREL) 100 MG tablet      No current facility-administered medications for this visit.     Review of Systems:  GENERAL:  Feels "ok".  "Still kickin". No fevers or sweats.  Weight up 7 pounds. PERFORMANCE STATUS (ECOG):  1 HEENT:  No visual changes, runny nose, sore throat, mouth sores or tenderness. Infrequent epistaxis. Lungs: No shortness of breath or cough.  No hemoptysis. Cardiac:  No chest pain, palpitations, orthopnea, or PND. GI:  Nausea.   No vomiting, diarrhea, constipation, melena or hematochezia. GU:  No urgency, frequency, dysuria, or hematuria.  Insignificant vaginal bleeding. Musculoskeletal:  No back pain.  No joint pain.  No muscle tenderness. Extremities:  No pain or swelling. Skin:  No rashes or skin changes. Neuro:  No headache, numbness or weakness, balance or coordination issues. Endocrine:  No diabetes, thyroid issues, hot flashes.  Night sweats.   Psych:  No mood changes, depression or anxiety. Pain:  No focal pain. Review of systems:  All other systems reviewed and found to be negative.  Physical Exam: Blood pressure 134/87, pulse 73, temperature 97.8 F (36.6 C), temperature source Tympanic, resp. rate 12, height '5\' 6"'$  (1.676 m), weight 251 lb 9.6 oz (114.1 kg). GENERAL:  Well developed, well nourished, woman sitting comfortably in the exam room in no acute distress. MENTAL STATUS:  Alert and  oriented to person, place and time. HEAD:  Shoulder length gray hair.  Normocephalic, atraumatic, face symmetric, no Cushingoid features. EYES:  Blue eyes.  Pupils equal round and reactive to light and accomodation.  No conjunctivitis or scleral icterus. ENT:  Oropharynx clear without lesion.  Tongue normal. Mucous membranes moist.  RESPIRATORY:  Clear to auscultation without rales, wheezes or rhonchi. CARDIOVASCULAR:  Regular rate and rhythm without murmur, rub or gallop. ABDOMEN:  Soft, non-tender, with active bowel sounds, and no hepatomegaly.  Spleen palpable.  No masses. SKIN:  No rashes, ulcers or lesions.  No petechiae or ecchymosis. EXTREMITIES: No edema, no skin discoloration or tenderness.  No palpable cords. LYMPH NODES: No palpable cervical, supraclavicular, axillary or inguinal adenopathy  NEUROLOGICAL: Unremarkable. PSYCH:  Appropriate.   Appointment on 04/16/2017  Component Date Value Ref Range Status  . Bilirubin, Direct 04/16/2017 0.4  0.1 - 0.5 mg/dL Final   Performed at [facility], [address] [place], [place] [address]  . WBC 04/16/2017 4.7  3.6 - 11.0 K/uL Final  . RBC 04/16/2017 5.23* 3.80 - 5.20 MIL/uL Final  . Hemoglobin 04/16/2017 15.1  12.0 - 16.0 g/dL Final  . HCT 04/16/2017 45.0  35.0 - 47.0 % Final  . MCV 04/16/2017 86.1  80.0 - 100.0 fL Final  . MCH 04/16/2017 28.9  26.0 - 34.0 pg Final  . MCHC 04/16/2017 33.5  32.0 - 36.0 g/dL Final  . RDW 04/16/2017 15.4* 11.5 - 14.5 % Final  . Platelets 04/16/2017 49* 150 - 440 K/uL Final  . Neutrophils Relative % 04/16/2017 68  % Final  . Neutro Abs 04/16/2017 3.2  1.4 - 6.5 K/uL Final  . Lymphocytes Relative 04/16/2017 21  % Final  . Lymphs Abs 04/16/2017 1.0  1.0 - 3.6 K/uL Final  . Monocytes Relative 04/16/2017 7  % Final  . Monocytes Absolute 04/16/2017 0.3  0.2 - 0.9 K/uL Final  . Eosinophils Relative 04/16/2017 3  % Final  . Eosinophils Absolute 04/16/2017 0.1  0 - 0.7 K/uL Final  . Basophils  Relative 04/16/2017 1  % Final  . Basophils Absolute 04/16/2017 0.0  0 - 0.1 K/uL Final   Performed at [facility], [address] [place], [place] [address]  . Sodium 04/16/2017 140  135 - 145 mmol/L Final  . Potassium 04/16/2017 4.3  3.5 - 5.1 mmol/L Final  . Chloride 04/16/2017 103  101 - 111 mmol/L Final  . CO2 04/16/2017 27  22 - 32 mmol/L Final  . Glucose, Bld 04/16/2017 147* 65 - 99 mg/dL Final  . BUN 04/16/2017 13  6 - 20 mg/dL Final  . Creatinine, Ser 04/16/2017 0.83  0.44 - 1.00 mg/dL Final  . Calcium 04/16/2017 9.5  8.9 - 10.3 mg/dL Final  . Total Protein 04/16/2017 7.5  6.5 - 8.1 g/dL Final  . Albumin 04/16/2017 4.4  3.5 - 5.0 g/dL Final  . AST 04/16/2017 48* 15 - 41 U/L Final  . ALT 04/16/2017 43  14 -  54 U/L Final  . Alkaline Phosphatase 04/16/2017 111  38 - 126 U/L Final  . Total Bilirubin 04/16/2017 1.8* 0.3 - 1.2 mg/dL Final  . GFR calc non Af Amer 04/16/2017 >60  >60 mL/min Final  . GFR calc Af Amer 04/16/2017 >60  >60 mL/min Final   Comment: (NOTE) The eGFR has been calculated using the CKD EPI equation. This calculation has not been validated in all clinical situations. eGFR's persistently <60 mL/min signify possible Chronic Kidney Disease.   Georgiann Hahn gap 04/16/2017 10  5 - 15 Final   Performed at [facility], [address] [place], [place] [address]    Assessment:  Alliana Mcauliff is a 52 y.o. female with cirrhosis and associated moderate thrombocytopenia and a lupus anticoagulant.  Platelet count fluctuates between 36,000 and 50,000.  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    Work-up on 04/10/2014 revealed a platelet count in a blue top tube was 46,000 (rule out pseudothrombocytopenia).  Negative labs included an ANA, ferritin, iron studies, B12, folate, SPEP (apparent polyclonal gammopathy), UPEP, hepatitis B surface antigen, hepatitis C RNA, and HIV testing.  Fibrinogen was 207 (210-470).  PTT was elevated at 45.3 (23.6-35.9)  suggestive of a possible factor deficiency or inhibitor.  Additional testing on 04/20/2014 confirmed a lupus anticoagulant.  Lupus anticoagulant testing was positive again on 01/25/2015.  Abdominal ultrasound on 04/18/2014 revealed a prominent left hepatic lobe with mild nodularity and splenomegaly (16.9 cm) suggestive of possible cirrhosis.  Patient denies any alcohol use (last 10 years ago).  RUQ ultrasound on 10/24/2016 revealed a stable appearance of the liver from the prior ultrasound.  There was evidence consistent with hepatic cirrhosis.  There were no focal lesions.  EGD in 03/2014 was normal per patient's report.  She received the hepatitis B vaccine series.  She is considering a liver transplant.    AFP has been followed:  4.9 on 04/17/2015, 4.1 on 08/07/2015, 5.0 on 04/14/2016, 5.2 on 10/13/2016, and 5.8 on 04/16/2017.  Chest CT on 06/01/2014 revealed an abnormal attenuation in the right lobe of the liver for which more definitive characterization by hepatic MRI was suggested to exclude malignancy.  She notes a liver MRI in [facility] this year.  RUQ ultrasound on 04/02/2016 revealed cirrhotic changes within the liver without discrete hepatic mass.  RUQ ultrasound on 10/24/2016 revealed a stable appearance of the liver.  There was evidence consistent with hepatic cirrhosis.  There were no focal lesions.  She presented with a vaginal hemorrhage on 01/05/2015.  She required 3 units of PRBCs and 1 unit pheresed platelets.  She had an severe allergic reaction to platelets.  She will require premedications of Benadryl +/- steroids.  PT was 13.1 (INR 1.18) and PTT was 28.3. Fibrinogen was 158 (208-409).   Von Willebrand testing was negative.  Platelet function assay was c/w aspirin effect.  She is being considered for hysterectomy versus uterine ablation.  She will likely have a uterine biopsy in the near future.  She has had no further heavy menses.  Symptomatically, she denies any complaint.  She  denies any significant bruising or bleeding.  Platelet count is 49,000.  Bilirubin is 1.8 (direct 0.4).  Plan: 1.  Labs today:  CBC with diff, CMP, direct bilirubin, AFP. 2.  Review interval abdominal ultrasound- no change.  Continue ultrasounds every 6 months with AFP. 3.  Schedule RUQ ultrasound 04/27/2017. 4.  Discuss follow up with transplant team. Patient is no longer considering  a liver transplant at this time.  6.  RTC in 6 months for MD assessment, labs (CBC with diff, CMP, direct bilirubin, AFP), and review of ultrasound.    Honor Loh, NP  04/16/2017,  3:39 PM    I saw and evaluated the patient, participating in the key portions of the service and reviewing pertinent diagnostic studies and records.  I reviewed the nurse practitioner's note and agree with the findings and the plan.  The assessment and plan were discussed with the patient.  A few questions were asked by the patient and answered.   Nolon Stalls, MD 04/16/2017,3:39 PM

## 2017-04-16 NOTE — Progress Notes (Signed)
See follow up. 

## 2017-04-17 LAB — AFP TUMOR MARKER: AFP, Serum, Tumor Marker: 5.8 ng/mL (ref 0.0–8.3)

## 2017-04-27 ENCOUNTER — Ambulatory Visit: Payer: Medicare Other

## 2017-05-05 ENCOUNTER — Ambulatory Visit
Admission: RE | Admit: 2017-05-05 | Discharge: 2017-05-05 | Disposition: A | Discharge status: Home / Self Care | Payer: Medicare Other | Source: Ambulatory Visit | Attending: Urgent Care | Admitting: Urgent Care

## 2017-05-05 DIAGNOSIS — K7469 Other cirrhosis of liver: Secondary | ICD-10-CM

## 2017-05-05 DIAGNOSIS — D696 Thrombocytopenia, unspecified: Secondary | ICD-10-CM | POA: Diagnosis present

## 2017-05-05 DIAGNOSIS — K7460 Unspecified cirrhosis of liver: Secondary | ICD-10-CM | POA: Insufficient documentation

## 2017-10-15 ENCOUNTER — Encounter: Payer: Self-pay | Admitting: Hematology and Oncology

## 2017-10-15 ENCOUNTER — Inpatient Hospital Stay: Payer: Medicare Other | Attending: Hematology and Oncology

## 2017-10-15 ENCOUNTER — Other Ambulatory Visit: Payer: Self-pay

## 2017-10-15 ENCOUNTER — Inpatient Hospital Stay (HOSPITAL_BASED_OUTPATIENT_CLINIC_OR_DEPARTMENT_OTHER): Payer: Medicare Other | Admitting: Hematology and Oncology

## 2017-10-15 VITALS — BP 133/85 | HR 76 | Temp 98.5°F | Resp 18 | Wt 247.1 lb

## 2017-10-15 DIAGNOSIS — D6862 Lupus anticoagulant syndrome: Secondary | ICD-10-CM | POA: Diagnosis not present

## 2017-10-15 DIAGNOSIS — L409 Psoriasis, unspecified: Secondary | ICD-10-CM | POA: Insufficient documentation

## 2017-10-15 DIAGNOSIS — D696 Thrombocytopenia, unspecified: Secondary | ICD-10-CM

## 2017-10-15 DIAGNOSIS — K7469 Other cirrhosis of liver: Secondary | ICD-10-CM

## 2017-10-15 DIAGNOSIS — Z79899 Other long term (current) drug therapy: Secondary | ICD-10-CM | POA: Diagnosis not present

## 2017-10-15 DIAGNOSIS — K7460 Unspecified cirrhosis of liver: Secondary | ICD-10-CM

## 2017-10-15 DIAGNOSIS — R760 Raised antibody titer: Secondary | ICD-10-CM

## 2017-10-15 DIAGNOSIS — R161 Splenomegaly, not elsewhere classified: Secondary | ICD-10-CM

## 2017-10-15 LAB — COMPREHENSIVE METABOLIC PANEL
ALT: 36 U/L (ref 0–44)
AST: 46 U/L — ABNORMAL HIGH (ref 15–41)
Albumin: 4 g/dL (ref 3.5–5.0)
Alkaline Phosphatase: 119 U/L (ref 38–126)
Anion gap: 11 (ref 5–15)
BUN: 12 mg/dL (ref 6–20)
CO2: 23 mmol/L (ref 22–32)
Calcium: 9.4 mg/dL (ref 8.9–10.3)
Chloride: 108 mmol/L (ref 98–111)
Creatinine, Ser: 0.79 mg/dL (ref 0.44–1.00)
GFR calc Af Amer: >60 mL/min (ref >60)
GFR calc non Af Amer: >60 mL/min (ref >60)
Glucose, Bld: 137 mg/dL — ABNORMAL HIGH (ref 70–99)
Potassium: 4.2 mmol/L (ref 3.5–5.1)
Sodium: 142 mmol/L (ref 135–145)
Total Bilirubin: 1.9 mg/dL — ABNORMAL HIGH (ref 0.3–1.2)
Total Protein: 7.2 g/dL (ref 6.5–8.1)

## 2017-10-15 LAB — CBC WITH DIFFERENTIAL/PLATELET
Basophils Absolute: 0 10*3/uL (ref 0–0.1)
Basophils Relative: 1 %
Eosinophils Absolute: 0.1 10*3/uL (ref 0–0.7)
Eosinophils Relative: 2 %
HCT: 42.7 % (ref 35.0–47.0)
Hemoglobin: 14.5 g/dL (ref 12.0–16.0)
Lymphocytes Relative: 23 %
Lymphs Abs: 1 10*3/uL (ref 1.0–3.6)
MCH: 29.7 pg (ref 26.0–34.0)
MCHC: 34 g/dL (ref 32.0–36.0)
MCV: 87.2 fL (ref 80.0–100.0)
Monocytes Absolute: 0.3 10*3/uL (ref 0.2–0.9)
Monocytes Relative: 8 %
Neutro Abs: 2.8 10*3/uL (ref 1.4–6.5)
Neutrophils Relative %: 66 %
Platelets: 46 10*3/uL — ABNORMAL LOW (ref 150–440)
RBC: 4.9 MIL/uL (ref 3.80–5.20)
RDW: 15.6 % — ABNORMAL HIGH (ref 11.5–14.5)
WBC: 4.3 10*3/uL (ref 3.6–11.0)

## 2017-10-15 LAB — BILIRUBIN, DIRECT: Bilirubin, Direct: 0.5 mg/dL — ABNORMAL HIGH (ref 0.0–0.2)

## 2017-10-15 NOTE — Progress Notes (Signed)
[facility] day:  10/15/17   Chief Complaint: Jessica Cline is a 52 y.o. female with cirrhosis, thrombocytopenia, and a lupus anticoagulant who is seen for 6 month assessment.  HPI: The patient was last seen in the medical oncology clinic on 04/16/2017.  At that time, she denied any complaint.  She denied any significant bruising or bleeding.  Platelet count was 49,000.  Bilirubin was 1.8 (direct 0.4). Abdominal ultrasound was stable.  We discussed liver transplant; she was not interested.  RUQ ultrasound on 05/05/2017 revealed a cirrhotic liver similar in appearance to the prior exam.  There was no focal hepatic mass.  During the interim, she has done well.  She denies any bruising or bleeding.  She has had some leg swelling.  She has occasional nausea.  She has psoriasis.  She notes stressors.     Past Medical History:  Diagnosis Date  . Arthritis   . Blood transfusion without reported diagnosis     History reviewed. No pertinent surgical history.  Family History  Problem Relation Age of Onset  . Cancer Father   . Cancer Maternal Grandmother     Social History:  reports that she has never smoked. Her smokeless tobacco use includes snuff. She reports that she has current or past drug history. Drug: Marijuana. Her alcohol history is not on file.  She cashed in her insurance policy.  Patient's contact number is [phone]. She lives in [place].  The patient is alone today.  Allergies:  Allergies  Allergen Reactions  . Plasma, Human Swelling    Current Medications: Current Outpatient Medications  Medication Sig Dispense Refill  . acetaminophen (TYLENOL) 325 MG tablet Take 650 mg by mouth every 6 (six) hours as needed.    Marland Kitchen albuterol (PROVENTIL HFA;VENTOLIN HFA) 108 (90 Base) MCG/ACT inhaler Inhale into the lungs. Reported on 08/07/2015    . amLODipine (NORVASC) 10 MG tablet     . celecoxib (CELEBREX) 100 MG capsule Take 100 mg  by mouth daily.    Marland Kitchen desmopressin (DDAVP) 0.01 % SOLN     . sertraline (ZOLOFT) 100 MG tablet     . traZODone (DESYREL) 100 MG tablet      No current facility-administered medications for this visit.     Review of Systems:  GENERAL:  Feels "the same".  No fevers, sweats.  Weight loss of 4 pounds in 6 months. PERFORMANCE STATUS (ECOG):  1 HEENT:  No visual changes, runny nose, sore throat, mouth sores or tenderness. Lungs: No shortness of breath or cough.  No hemoptysis. Cardiac:  No chest pain, palpitations, orthopnea, or PND. GI:  Occasional nausea.  No vomiting, diarrhea, constipation, melena or hematochezia. GU:  No urgency, frequency, dysuria, or hematuria. No vaginal bleeding. Musculoskeletal:  No back pain.  No joint pain.  No muscle tenderness. Extremities:  No pain or swelling. Skin:  Psoriasis. Neuro:  No headache, numbness or weakness, balance or coordination issues. Endocrine:  No diabetes, thyroid issues, hot flashes.  Night sweats. Psych:  Stress.  No mood changes, depression or anxiety. Pain:  No focal pain. Review of systems:  All other systems reviewed and found to be negative.   Physical Exam: Blood pressure 133/85, pulse 76, temperature 98.5 F (36.9 C), temperature source Tympanic, resp. rate 18, weight 247 lb 1 oz (112.1 kg). GENERAL:  Well developed, well nourished, woman sitting comfortably in the exam room in no acute distress. MENTAL STATUS:  Alert and oriented  to person, place and time. HEAD:  Shoulder length gray hair.  Normocephalic, atraumatic, face symmetric, no Cushingoid features. EYES:  Blue eyes.  Pupils equal round and reactive to light and accomodation.  No conjunctivitis or scleral icterus. ENT:  Oropharynx clear without lesion.  Tongue normal. Mucous membranes moist.  RESPIRATORY:  Clear to auscultation without rales, wheezes or rhonchi. CARDIOVASCULAR:  Regular rate and rhythm without murmur, rub or gallop. ABDOMEN:  Soft, non-tender, with  active bowel sounds, and no hepatomegaly.  Spleen tip palpable (stable).  No masses. SKIN:  Psoriasis face/nose. EXTREMITIES: Trace lower extremity edema (left > right).  No skin discoloration or tenderness.  No palpable cords. LYMPH NODES: No palpable cervical, supraclavicular, axillary or inguinal adenopathy  NEUROLOGICAL: Unremarkable. PSYCH:  Appropriate.    Orders Only on 10/15/2017  Component Date Value Ref Range Status  . Bilirubin, Direct 10/15/2017 0.5* 0.0 - 0.2 mg/dL Final   Performed at [facility], [address] [place], [place] [address]  . AFP, Serum, Tumor Marker 10/15/2017 4.8  0.0 - 8.3 ng/mL Final   Comment: (NOTE) Roche Diagnostics Electrochemiluminescence Immunoassay (ECLIA) Values obtained with different assay methods or kits cannot be used interchangeably.  Results cannot be interpreted as absolute evidence of the presence or absence of malignant disease. This test is not interpretable in pregnant females. Performed At: [facility] [place], [place] [id number] Rush Farmer MD [id number]   . Sodium 10/15/2017 142  135 - 145 mmol/L Final  . Potassium 10/15/2017 4.2  3.5 - 5.1 mmol/L Final  . Chloride 10/15/2017 108  98 - 111 mmol/L Final  . CO2 10/15/2017 23  22 - 32 mmol/L Final  . Glucose, Bld 10/15/2017 137* 70 - 99 mg/dL Final  . BUN 10/15/2017 12  6 - 20 mg/dL Final  . Creatinine, Ser 10/15/2017 0.79  0.44 - 1.00 mg/dL Final  . Calcium 10/15/2017 9.4  8.9 - 10.3 mg/dL Final  . Total Protein 10/15/2017 7.2  6.5 - 8.1 g/dL Final  . Albumin 10/15/2017 4.0  3.5 - 5.0 g/dL Final  . AST 10/15/2017 46* 15 - 41 U/L Final  . ALT 10/15/2017 36  0 - 44 U/L Final  . Alkaline Phosphatase 10/15/2017 119  38 - 126 U/L Final  . Total Bilirubin 10/15/2017 1.9* 0.3 - 1.2 mg/dL Final  . GFR calc non Af Amer 10/15/2017 >60  >60 mL/min Final  . GFR calc Af Amer 10/15/2017 >60  >60 mL/min Final   Comment: (NOTE) The eGFR has been  calculated using the CKD EPI equation. This calculation has not been validated in all clinical situations. eGFR's persistently <60 mL/min signify possible Chronic Kidney Disease.   Georgiann Hahn gap 10/15/2017 11  5 - 15 Final   Performed at [facility], [address] [place], [place] [address]  . WBC 10/15/2017 4.3  3.6 - 11.0 K/uL Final  . RBC 10/15/2017 4.90  3.80 - 5.20 MIL/uL Final  . Hemoglobin 10/15/2017 14.5  12.0 - 16.0 g/dL Final  . HCT 10/15/2017 42.7  35.0 - 47.0 % Final  . MCV 10/15/2017 87.2  80.0 - 100.0 fL Final  . MCH 10/15/2017 29.7  26.0 - 34.0 pg Final  . MCHC 10/15/2017 34.0  32.0 - 36.0 g/dL Final  . RDW 10/15/2017 15.6* 11.5 - 14.5 % Final  . Platelets 10/15/2017 46* 150 - 440 K/uL Final  . Neutrophils Relative % 10/15/2017 66  % Final  . Neutro Abs 10/15/2017 2.8  1.4 - 6.5 K/uL Final  . Lymphocytes Relative 10/15/2017 23  % Final  . Lymphs Abs 10/15/2017 1.0  1.0 - 3.6 K/uL Final  . Monocytes Relative 10/15/2017 8  % Final  . Monocytes Absolute 10/15/2017 0.3  0.2 - 0.9 K/uL Final  . Eosinophils Relative 10/15/2017 2  % Final  . Eosinophils Absolute 10/15/2017 0.1  0 - 0.7 K/uL Final  . Basophils Relative 10/15/2017 1  % Final  . Basophils Absolute 10/15/2017 0.0  0 - 0.1 K/uL Final   Performed at [facility], [address] [place], [place] [address]    Assessment:  RENESHA LIZAMA is a 52 y.o. female with cirrhosis and associated moderate thrombocytopenia and a lupus anticoagulant.  Platelet count fluctuates between 36,000 and 50,000.  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    Work-up on 04/10/2014 revealed a platelet count in a blue top tube was 46,000 (rule out pseudothrombocytopenia).  Negative labs included an ANA, ferritin, iron studies, B12, folate, SPEP (apparent polyclonal gammopathy), UPEP, hepatitis B surface antigen, hepatitis C RNA, and HIV testing.  Fibrinogen was 207 (210-470).  PTT was elevated at 45.3 (23.6-35.9)  suggestive of a possible factor deficiency or inhibitor.  Additional testing on 04/20/2014 confirmed a lupus anticoagulant.  Lupus anticoagulant testing was positive again on 01/25/2015.  Abdominal ultrasound on 04/18/2014 revealed a prominent left hepatic lobe with mild nodularity and splenomegaly (16.9 cm) suggestive of possible cirrhosis.  Patient denies any alcohol use (last 10 years ago).  RUQ ultrasound on 10/24/2016 revealed a stable appearance of the liver from the prior ultrasound.  There was evidence consistent with hepatic cirrhosis.  There were no focal lesions.  RUQ ultrasound on 05/05/2017 revealed a cirrhotic liver similar in appearance to the prior exam.  There was no focal hepatic mass.  EGD in 03/2014 was normal per patient's report.  She received the hepatitis B vaccine series.  She is no longer considering a liver transplant.    AFP has been followed:  4.9 on 04/17/2015, 4.1 on 08/07/2015, 5.0 on 04/14/2016, 5.2 on 10/13/2016, 5.8 on 04/16/2017, and 4.8 on 10/15/2017.  Chest CT on 06/01/2014 revealed an abnormal attenuation in the right lobe of the liver for which more definitive characterization by hepatic MRI was suggested to exclude malignancy.  She notes a liver MRI in [facility] this year.  RUQ ultrasound on 04/02/2016 revealed cirrhotic changes within the liver without discrete hepatic mass.  RUQ ultrasound on 10/24/2016 revealed a stable appearance of the liver.  There was evidence consistent with hepatic cirrhosis.  There were no focal lesions.  She presented with a vaginal hemorrhage on 01/05/2015.  She required 3 units of PRBCs and 1 unit pheresed platelets.  She had an severe allergic reaction to platelets.  She will require premedications of Benadryl +/- steroids.  PT was 13.1 (INR 1.18) and PTT was 28.3. Fibrinogen was 158 (208-409).   Von Willebrand testing was negative.  Platelet function assay was c/w aspirin effect.  She was initially considered for hysterectomy versus  uterine ablation.  She had no further heavy menses.  Symptomatically, she is doing well.  She denies any bleeding or thrombosis.  Platelet count is 46,000.  Bilirubin is 1.9.  Plan: 1.  Labs today:  CBC with diff, CMP, direct bilirubin, AFP. 2.  Thrombocytopenia:  Platelet count stable.  No bleeding.  Continue to monitor. 2.  Cirrhosis:  Discuss patient's thoughts about liver transplant- she declines.  Abdominal ultrasound  reviewed- no lesions.  AFO normal.  Continue monitoring every 6 months. 3.  Schedule RUQ ultrasound on 11/02/2017. 4.  RTC in 6 months for MD assessment, labs (CBC with diff, CMP, direct bilirubin, AFP), and review of RUQ ultrasound.   Lequita Asal, MD  10/15/2017,  4:35 PM

## 2017-10-15 NOTE — Progress Notes (Signed)
Patient states she has occasional nausea. Has chronic pain in legs and knees  Otherwise, offers no complaints.

## 2017-10-16 LAB — AFP TUMOR MARKER: AFP, Serum, Tumor Marker: 4.8 ng/mL (ref 0.0–8.3)

## 2017-11-02 ENCOUNTER — Ambulatory Visit: Payer: Medicare Other

## 2017-11-02 ENCOUNTER — Telehealth: Payer: Self-pay | Admitting: *Deleted

## 2017-11-02 NOTE — Telephone Encounter (Signed)
R/S US Per MD on 11/02/17 US was R/S as requested. US is now scheduled for 11/06/17 @ 9:30 I called and left a message on her vmail to make her aware. Also a letter  Will be mailed out as well

## 2017-11-02 NOTE — Telephone Encounter (Signed)
  Please call patient and reschedule.  M

## 2017-11-02 NOTE — Telephone Encounter (Signed)
US department called to report that patient did NOT show for her US Abdomen today

## 2017-11-06 ENCOUNTER — Ambulatory Visit: Payer: Medicare Other

## 2017-11-25 ENCOUNTER — Ambulatory Visit
Admission: RE | Admit: 2017-11-25 | Discharge: 2017-11-25 | Disposition: A | Discharge status: Home / Self Care | Payer: Medicare Other | Source: Ambulatory Visit | Attending: Urgent Care | Admitting: Urgent Care

## 2017-11-25 DIAGNOSIS — K7469 Other cirrhosis of liver: Secondary | ICD-10-CM | POA: Diagnosis not present

## 2017-11-25 DIAGNOSIS — D696 Thrombocytopenia, unspecified: Secondary | ICD-10-CM | POA: Diagnosis present

## 2017-11-26 ENCOUNTER — Telehealth: Payer: Self-pay | Admitting: *Deleted

## 2017-11-26 NOTE — Telephone Encounter (Signed)
Called patient to inform her that the gallbladder neck is thickened.  However, no calculi present.  If patient has symptoms, she is instructed to contact her PCP.

## 2017-11-26 NOTE — Telephone Encounter (Signed)
-----   Message from Verlee Monte, NP sent at 11/25/2017 12:10 PM EDT ----- Regarding: FW: Gallbladder wall is thick. Is she having any biliary symptoms that would suggest acute cholecystitis?   There are no calculi, so her presentation may be different. Have her follow up with PCP for pain, nausea, vomiting, or fevers. Note, pain may be through and through to her back, or even referred to her shoulder.   Judie Grieve ----- Message ----- From: Interface, Rad Results In Sent: 11/25/2017  11:08 AM EDT To: Verlee Monte, NP

## 2018-02-17 ENCOUNTER — Encounter: Payer: Self-pay | Admitting: Gastroenterology

## 2018-03-22 ENCOUNTER — Ambulatory Visit (INDEPENDENT_AMBULATORY_CARE_PROVIDER_SITE_OTHER): Payer: Medicare Other | Admitting: Gastroenterology

## 2018-03-22 ENCOUNTER — Encounter: Payer: Self-pay | Admitting: Gastroenterology

## 2018-03-22 VITALS — BP 134/60 | HR 88 | Ht 65.75 in | Wt 243.0 lb

## 2018-03-22 DIAGNOSIS — K7469 Other cirrhosis of liver: Secondary | ICD-10-CM

## 2018-03-22 DIAGNOSIS — D696 Thrombocytopenia, unspecified: Secondary | ICD-10-CM

## 2018-03-22 NOTE — Progress Notes (Addendum)
Referring Provider: Sherilyn Banker, MD Primary Care Physician:  Sherilyn Banker, MD   Reason for Consultation: Possible cirrhosis   IMPRESSION:  Cirrhosis by imaging with associated portal hypertension/thrombocytopenia    - likely due to prior alcohol +/- fatty liver    - completed vaccination for HAV and HBV    - No history of decompensation Intermittent RUQ pain that may be related to capsular stretch Obesity with BMI 40 History of heavy alcohol use    - drinking a pint daily starting at age 16    - no alcohol in over 20 years Colonoscopy in her 30s at [facility]  We reviewed the natural history of cirrhosis.  Although alcohol +/- fatty liver is the likely source, must consider concurrent etiologies.  I will assume these were previously obtained at [facility] and will work to obtain those records prior to repeating a full serologic evaluation again.   Using labs from 2018 and 2019 her MELD is 10.  Using a direct bilirubin her MELD score is 7. Although she has cirrhosis with associated portal hypertension, her synthetic function is too good to be considering liver transplant at this time.  There is been no history of decompensation.  However, as her disease progresses this will become a more focused conversation.  She is not interested in liver transplantation at this time.  I recommend labs every 6 months to monitor for disease progression.  I recommended an EGD to screen for esophageal varices.  She is not interested in any endoscopy at this time.  She should have an AFP and abdominal imaging every 6 months to screen for hepatocellular carcinoma.  Given her body habitus and prior screening with ultrasound I am recommending an MRI in March 2020.  PLAN: Obtain records from [facility] EGD to screen for esophageal varices She already has labs scheduled for January 2020 (will include INR with her lab orders) Abdominal MRI 05/2018 (as several images have been ultrasound) Encouraged ongoing  abstinence from alcohol Annual flu vaccine recommended Pneumovax recommended if she is not already received it Return to clinic every 6 months or earlier as needed  I consented the patient at the bedside today discussing the risks, benefits, and alternatives to endoscopic evaluation. In particular, we discussed the risks that include, but are not limited to, reaction to medication, cardiopulmonary compromise, bleeding requiring blood transfusion, aspiration resulting in pneumonia, perforation requiring surgery, lack of diagnosis, severe illness requiring hospitalization, and even death. We reviewed the risk of missed lesion including polyps or even cancer. The patient acknowledges these risks and asks that we proceed.   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 Cleda Clarks for further evaluation of cirrhosis.  History is obtained to the patient and review of her electronic health record. Previously followed by Dr. Dulce Sellar.  Disabled due to her legs, anxiety, and depression.   An ultrasound 11/25/2017 obtained to evaluate thrombocytopenia showed increased coarse heterogeneous parenchymal echogenicity and nodular contour of the liver.  Portal vein is normal.  Completed HAV and HBV vaccine. No prior EGD as she doesn't feel it's necessary. Prior colonoscopy in his 30s with Dr. Matthias Hughs.   She has an associated spasm in the RUQ that she attributes to the liver.   She is followed regularly by Dr. Cleda Clarks. She will have routine labs due 04/16/18 at [facility].   No family history of liver disease. Drinking at age 47. Heavy drinking with  a pint a day at age 52. No alcohol in 20 years. She drinks Welch's grape juice once daily.   Heartburn with orange juice. GI ROS is otherwise negative.   She has not wanted to pursue a transplant. She does not have kids, or resources to get to and from appointments, or financial security to pay for her medications.   Labs  from 10/15/2017 show a sodium 142, creatinine 0.79, total bilirubin 1.9, direct bilirubin 0.5, AST 46, ALT 36, alkaline phosphatase 119, albumin 4, hemoglobin 14.5, platelets 46, AFP 4.8.  The last INR is from 10/13/2016 and was 1.10.  Past Medical History:  Diagnosis Date  . Anxiety   . Arthritis   . Blood transfusion without reported diagnosis   . Depression   . HTN (hypertension)     Past Surgical History:  Procedure Laterality Date  . ANKLE SURGERY Left   . KNEE ARTHROSCOPY Left   . OVARY SURGERY Right    due to cyst or tumor  . PITUITARY SURGERY     tumor removed  . thumb SURGERY    . TONSILLECTOMY    . WRIST SURGERY Left     Current Outpatient Medications  Medication Sig Dispense Refill  . acetaminophen (TYLENOL) 325 MG tablet Take 650 mg by mouth every 6 (six) hours as needed.    Marland Kitchen. albuterol (PROVENTIL HFA;VENTOLIN HFA) 108 (90 Base) MCG/ACT inhaler Inhale into the lungs. Reported on 08/07/2015    . amLODipine (NORVASC) 10 MG tablet     . celecoxib (CELEBREX) 100 MG capsule Take 100 mg by mouth daily.    Marland Kitchen. desmopressin (DDAVP) 0.01 % SOLN     . sertraline (ZOLOFT) 100 MG tablet     . traZODone (DESYREL) 100 MG tablet      No current facility-administered medications for this visit.     Allergies as of 03/22/2018 - Review Complete 03/22/2018  Allergen Reaction Noted  . Plasma, human Swelling 08/07/2015    Family History  Problem Relation Age of Onset  . Pancreatic cancer Father   . Breast cancer Maternal Grandmother     Social History   Socioeconomic History  . Marital status: Single    Spouse name: Not on file  . Number of children: 0  . Years of education: Not on file  . Highest education level: Not on file  Occupational History  . Occupation: retired  Engineer, productionocial Needs  . Financial resource strain: Not on file  . Food insecurity:    Worry: Not on file    Inability: Not on file  . Transportation needs:    Medical: Not on file    Non-medical: Not on  file  Tobacco Use  . Smoking status: Never Smoker  . Smokeless tobacco: Current User    Types: Snuff  Substance and Sexual Activity  . Alcohol use: Not Currently    Alcohol/week: 0.0 standard drinks  . Drug use: Yes    Types: Marijuana  . Sexual activity: Not on file  Lifestyle  . Physical activity:    Days per week: Not on file    Minutes per session: Not on file  . Stress: Not on file  Relationships  . Social connections:    Talks on phone: Not on file    Gets together: Not on file    Attends religious service: Not on file    Active member of club or organization: Not on file    Attends meetings of clubs or organizations: Not on file  Relationship status: Not on file  . Intimate partner violence:    Fear of current or ex partner: Not on file    Emotionally abused: Not on file    Physically abused: Not on file    Forced sexual activity: Not on file  Other Topics Concern  . Not on file  Social History Narrative  . Not on file    Review of Systems: 12 system ROS is negative except as noted above with the additions of anxiety, arthritis, depression.  Filed Weights   03/22/18 1511  Weight: 243 lb (110.2 kg)    Physical Exam: Vital signs were reviewed.   General:   Alert, in NAD. Mild bilateral temporal wasting.  HEENT:  No scleral icterus. Normal oropharynx.  Heart:  Regular rate and rhythm; no murmurs Pulm: Clear anteriorly; no wheezing Abdomen:  Soft. Central obesity.  Nontender. Nondistended. Normal bowel sounds. No rebound or guarding. No fluid wave.  LAD: No inguinal or umbilical LAD Extremities:  Without edema. No bone deformities.  Neurologic:  Alert and  oriented x4;  grossly normal neurologically; no asterixis or clonus. Skin: No jaundice. No palmar erythema or spider angioma.  Psych:  Alert and cooperative. Normal mood and affect.   Aleese Kamps L. Orvan FalconerBeavers, MD, MPH [facility] 03/29/2018, 11:06 AM

## 2018-03-29 ENCOUNTER — Encounter: Payer: Self-pay | Admitting: Gastroenterology

## 2018-04-16 ENCOUNTER — Inpatient Hospital Stay: Payer: Medicare Other

## 2018-04-16 ENCOUNTER — Inpatient Hospital Stay: Payer: Medicare Other | Admitting: Hematology and Oncology

## 2018-04-23 ENCOUNTER — Encounter: Payer: Self-pay | Admitting: Hematology and Oncology

## 2018-04-23 ENCOUNTER — Inpatient Hospital Stay: Payer: Medicare Other | Attending: Hematology and Oncology

## 2018-04-23 ENCOUNTER — Inpatient Hospital Stay (HOSPITAL_BASED_OUTPATIENT_CLINIC_OR_DEPARTMENT_OTHER): Payer: Medicare Other | Admitting: Hematology and Oncology

## 2018-04-23 VITALS — BP 126/76 | HR 76 | Temp 97.0°F | Resp 18 | Ht 65.75 in | Wt 255.0 lb

## 2018-04-23 DIAGNOSIS — D696 Thrombocytopenia, unspecified: Secondary | ICD-10-CM | POA: Insufficient documentation

## 2018-04-23 DIAGNOSIS — F1290 Cannabis use, unspecified, uncomplicated: Secondary | ICD-10-CM | POA: Diagnosis not present

## 2018-04-23 DIAGNOSIS — R161 Splenomegaly, not elsewhere classified: Secondary | ICD-10-CM | POA: Diagnosis not present

## 2018-04-23 DIAGNOSIS — K7469 Other cirrhosis of liver: Secondary | ICD-10-CM

## 2018-04-23 DIAGNOSIS — D6862 Lupus anticoagulant syndrome: Secondary | ICD-10-CM | POA: Diagnosis not present

## 2018-04-23 LAB — CBC WITH DIFFERENTIAL/PLATELET
Abs Immature Granulocytes: 0 10*3/uL (ref 0.00–0.07)
Basophils Absolute: 0 10*3/uL (ref 0.0–0.1)
Basophils Relative: 1 %
Eosinophils Absolute: 0.1 10*3/uL (ref 0.0–0.5)
Eosinophils Relative: 2 %
HCT: 40.9 % (ref 36.0–46.0)
Hemoglobin: 13.2 g/dL (ref 12.0–15.0)
Immature Granulocytes: 0 %
Lymphocytes Relative: 22 %
Lymphs Abs: 0.8 10*3/uL (ref 0.7–4.0)
MCH: 28.9 pg (ref 26.0–34.0)
MCHC: 32.3 g/dL (ref 30.0–36.0)
MCV: 89.5 fL (ref 80.0–100.0)
Monocytes Absolute: 0.3 10*3/uL (ref 0.1–1.0)
Monocytes Relative: 8 %
Neutro Abs: 2.5 10*3/uL (ref 1.7–7.7)
Neutrophils Relative %: 67 %
Platelets: 35 10*3/uL — ABNORMAL LOW (ref 150–400)
RBC: 4.57 MIL/uL (ref 3.87–5.11)
RDW: 15.3 % (ref 11.5–15.5)
WBC: 3.7 10*3/uL — ABNORMAL LOW (ref 4.0–10.5)
nRBC: 0 % (ref 0.0–0.2)

## 2018-04-23 LAB — COMPREHENSIVE METABOLIC PANEL
ALT: 50 U/L — ABNORMAL HIGH (ref 0–44)
AST: 70 U/L — ABNORMAL HIGH (ref 15–41)
Albumin: 3.7 g/dL (ref 3.5–5.0)
Alkaline Phosphatase: 139 U/L — ABNORMAL HIGH (ref 38–126)
Anion gap: 10 (ref 5–15)
BUN: 8 mg/dL (ref 6–20)
CO2: 25 mmol/L (ref 22–32)
Calcium: 8.8 mg/dL — ABNORMAL LOW (ref 8.9–10.3)
Chloride: 105 mmol/L (ref 98–111)
Creatinine, Ser: 0.68 mg/dL (ref 0.44–1.00)
GFR calc Af Amer: >60 mL/min (ref >60)
GFR calc non Af Amer: >60 mL/min (ref >60)
Glucose, Bld: 117 mg/dL — ABNORMAL HIGH (ref 70–99)
Potassium: 3.8 mmol/L (ref 3.5–5.1)
Sodium: 140 mmol/L (ref 135–145)
Total Bilirubin: 2.2 mg/dL — ABNORMAL HIGH (ref 0.3–1.2)
Total Protein: 6.9 g/dL (ref 6.5–8.1)

## 2018-04-23 LAB — BILIRUBIN, DIRECT: Bilirubin, Direct: 0.8 mg/dL — ABNORMAL HIGH (ref 0.0–0.2)

## 2018-04-23 NOTE — Progress Notes (Signed)
Increase pain noted to right side / rib cage , due to a fall x 1 week

## 2018-04-23 NOTE — Progress Notes (Signed)
[facility]-  Cancer Center  Clinic day:  04/23/2018   Chief Complaint: Jessica Cline is a 53 y.o. female with cirrhosis, thrombocytopenia, and a lupus anticoagulant who is seen for 6 month assessment.  HPI: The patient was last seen in the medical oncology clinic on 10/15/2017.  At that time, she was doing well.  She denied any bleeding or thrombosis.  Platelet count was 46,000.  Bilirubin was 1.9.  She declined follow-up with GI and the transplant team.  RUQ ultrasound on 11/25/2017 revealed a cirrhotic appearance of the liver.  There were no focal masses.  There was normal direction of flow of the main portal vein. Gallbladder wall thickening measured 4 mm and may represent acalculus cholecystitis or may be reactive due to the chronic liver disease and/or hypoalbuminemia.  She was contacted regarding these findings.  She was seen by Dr. Tressia DanasKimberly Cline at [facility] on 03/22/2018.  Notes reviewed.  She was scheduled for EGD to screen for esophageal varices.  Abdominal MRI was planned for 05/2018.  She was encouraged to continue abstinence from alcohol.  Annual flu vaccine was recommended.  Pneumovax was recommended if she had not already received it.  The patient states that she is not being considered for transplant.  During the interim, she denies any new complaints.  She notes some right-sided rib and after a fall.  She denied any excessive bruising or bleeding.  She had a "blood spot on her pillow".  She notes "arthritis in all parts of her body".   Past Medical History:  Diagnosis Date  . Anxiety   . Arthritis   . Blood transfusion without reported diagnosis   . Depression   . HTN (hypertension)     Past Surgical History:  Procedure Laterality Date  . ANKLE SURGERY Left   . KNEE ARTHROSCOPY Left   . OVARY SURGERY Right    due to cyst or tumor  . PITUITARY SURGERY     tumor removed  . thumb SURGERY    . TONSILLECTOMY    . WRIST SURGERY Left      Family History  Problem Relation Age of Onset  . Pancreatic cancer Father   . Breast cancer Maternal Grandmother     Social History:  reports that she has never smoked. Her smokeless tobacco use includes snuff. She reports previous alcohol use. She reports current drug use. Drug: Marijuana.  She cashed in her insurance policy.  Patient's contact number is [phone]. She [place] in [place].  The patient is alone today.  Allergies:  Allergies  Allergen Reactions  . Plasma, Human Swelling    Current Medications: Current Outpatient Medications  Medication Sig Dispense Refill  . amLODipine (NORVASC) 10 MG tablet Take 10 mg by mouth daily.     . celecoxib (CELEBREX) 100 MG capsule Take 100 mg by mouth daily.    Marland Kitchen. desmopressin (DDAVP) 0.01 % SOLN Place 1 spray into the nose 2 (two) times daily.     . sertraline (ZOLOFT) 100 MG tablet Take 200 mg by mouth daily.     . traZODone (DESYREL) 100 MG tablet Take 200 mg by mouth at bedtime.     Marland Kitchen. acetaminophen (TYLENOL) 325 MG tablet Take 650 mg by mouth every 6 (six) hours as needed.    Marland Kitchen. albuterol (PROVENTIL HFA;VENTOLIN HFA) 108 (90 Base) MCG/ACT inhaler Inhale 2 puffs into the lungs every 6 (six) hours as needed. Reported on 08/07/2015    . spironolactone (ALDACTONE) 50  MG tablet Take 1 tablet (50 mg total) by mouth daily. 30 tablet 3   No current facility-administered medications for this visit.     Review of Systems:  GENERAL:  Feels "ok".  No fevers, sweats.  Weight up 8 pounds. PERFORMANCE STATUS (ECOG):  1 HEENT:  No visual changes, runny nose, sore throat, mouth sores or tenderness. Lungs: No shortness of breath or cough.  No hemoptysis. Cardiac:  No chest pain, palpitations, orthopnea, or PND. GI:  No nausea, vomiting, diarrhea, constipation, melena or hematochezia. GU:  No urgency, frequency, dysuria, or hematuria. Musculoskeletal:  Arthritis all over.  No muscle tenderness. Extremities:  No pain or swelling. Skin:   Psoriasis. Neuro:  No headache, numbness or weakness, balance or coordination issues. Endocrine:  No diabetes, thyroid issues, hot flashes or night sweats. Psych:  No mood changes, depression or anxiety. Pain:  No focal pain. Review of systems:  All other systems reviewed and found to be negative.   Physical Exam: Blood pressure 126/76, pulse 76, temperature (!) 97 F (36.1 C), temperature source Tympanic, resp. rate 18, height 5' 5.75" (1.67 m), weight 255 lb (115.7 kg), SpO2 100 %. GENERAL:  Well developed, well nourished, woman sitting comfortably in the exam room in no acute distress. MENTAL STATUS:  Alert and oriented to person, place and time. HEAD:  Short styled gray hair.  Normocephalic, atraumatic, face symmetric, no Cushingoid features. EYES:  Blue eyes.  Pupils equal round and reactive to light and accomodation.  No conjunctivitis or scleral icterus. ENT:  Oropharynx clear without lesion.  Tongue normal. Mucous membranes moist.  RESPIRATORY:  Clear to auscultation without rales, wheezes or rhonchi. CARDIOVASCULAR:  Regular rate and rhythm without murmur, rub or gallop. ABDOMEN:  Soft, non-tender, with active bowel sounds, and no hepatomegaly.  Spleen tip palpable. No masses. SKIN:  Right sided facial eschar.  No rashes, ulcers or lesions. EXTREMITIES: No edema, no skin discoloration or tenderness.  No palpable cords. LYMPH NODES: No palpable cervical, supraclavicular, axillary or inguinal adenopathy  NEUROLOGICAL: Unremarkable. PSYCH:  Appropriate.    Imaging studies: 04/18/2014:  Abdominal ultrasound revealed a prominent left hepatic lobe with mild nodularity and splenomegaly (16.9 cm) suggestive of possible cirrhosis.   06/01/2014:  Chest CT revealed an abnormal attenuation in the right lobe of the liver for which more definitive characterization by hepatic MRI was suggested to exclude malignancy. 04/02/2016:  RUQ ultrasound revealed cirrhotic changes within the liver without  discrete hepatic mass.   10/24/2016:  RUQ ultrasound revealed a stable appearance of the liver from the prior ultrasound.  There was evidence consistent with hepatic cirrhosis.  There were no focal lesions.   05/05/2017:  RUQ ultrasound revealed a cirrhotic liver similar in appearance to the prior exam.  There was no focal hepatic mass.   11/25/2017:  RUQ ultrasound revealed a cirrhotic appearance of the liver.  There were no focal masses.  There was normal direction of flow of the main portal vein. Gallbladder wall thickening measured 4 mm and may represent acalculus cholecystitis or may be reactive due to the chronic liver disease and/or hypoalbuminemia.   Appointment on 04/23/2018  Component Date Value Ref Range Status  . Bilirubin, Direct 04/23/2018 0.8* 0.0 - 0.2 mg/dL Final   Performed at [facility], [address] [place], [place] [address]  . AFP, Serum, Tumor Marker 04/23/2018 4.7  0.0 - 8.3 ng/mL Final   Comment: (NOTE) Roche [employer]ostics Electrochemiluminescence Immunoassay (ECLIA) Values obtained with different assay methods or  kits cannot be used interchangeably.  Results cannot be interpreted as absolute evidence of the presence or absence of malignant disease. This test is not interpretable in pregnant females. Performed At: [facility] [address] [place], [place] [id number] Jolene Schimke MD WU:[id number]   . Sodium 04/23/2018 140  135 - 145 mmol/L Final  . Potassium 04/23/2018 3.8  3.5 - 5.1 mmol/L Final  . Chloride 04/23/2018 105  98 - 111 mmol/L Final  . CO2 04/23/2018 25  22 - 32 mmol/L Final  . Glucose, Bld 04/23/2018 117* 70 - 99 mg/dL Final  . BUN 95/62/1308 8  6 - 20 mg/dL Final  . Creatinine, Ser 04/23/2018 0.68  0.44 - 1.00 mg/dL Final  . Calcium 65/78/4696 8.8* 8.9 - 10.3 mg/dL Final  . Total Protein 04/23/2018 6.9  6.5 - 8.1 g/dL Final  . Albumin 29/52/8413 3.7  3.5 - 5.0 g/dL Final  . AST 24/40/1027 70* 15 - 41 U/L Final  . ALT  04/23/2018 50* 0 - 44 U/L Final  . Alkaline Phosphatase 04/23/2018 139* 38 - 126 U/L Final  . Total Bilirubin 04/23/2018 2.2* 0.3 - 1.2 mg/dL Final  . GFR calc non Af Amer 04/23/2018 >60  >60 mL/min Final  . GFR calc Af Amer 04/23/2018 >60  >60 mL/min Final  . Anion gap 04/23/2018 10  5 - 15 Final   Performed at [facility], [address] [place], [place] [address]  . WBC 04/23/2018 3.7* 4.0 - 10.5 K/uL Final  . RBC 04/23/2018 4.57  3.87 - 5.11 MIL/uL Final  . Hemoglobin 04/23/2018 13.2  12.0 - 15.0 g/dL Final  . HCT 44/05/4740 40.9  36.0 - 46.0 % Final  . MCV 04/23/2018 89.5  80.0 - 100.0 fL Final  . MCH 04/23/2018 28.9  26.0 - 34.0 pg Final  . MCHC 04/23/2018 32.3  30.0 - 36.0 g/dL Final  . RDW 59/56/3875 15.3  11.5 - 15.5 % Final  . Platelets 04/23/2018 35* 150 - 400 K/uL Final   Comment: SPECIMEN CHECKED FOR CLOTS Immature Platelet Fraction may be clinically indicated, consider ordering this additional test [id number]   . nRBC 04/23/2018 0.0  0.0 - 0.2 % Final  . Neutrophils Relative % 04/23/2018 67  % Final  . Neutro Abs 04/23/2018 2.5  1.7 - 7.7 K/uL Final  . Lymphocytes Relative 04/23/2018 22  % Final  . Lymphs Abs 04/23/2018 0.8  0.7 - 4.0 K/uL Final  . Monocytes Relative 04/23/2018 8  % Final  . Monocytes Absolute 04/23/2018 0.3  0.1 - 1.0 K/uL Final  . Eosinophils Relative 04/23/2018 2  % Final  . Eosinophils Absolute 04/23/2018 0.1  0.0 - 0.5 K/uL Final  . Basophils Relative 04/23/2018 1  % Final  . Basophils Absolute 04/23/2018 0.0  0.0 - 0.1 K/uL Final  . Immature Granulocytes 04/23/2018 0  % Final  . Abs Immature Granulocytes 04/23/2018 0.00  0.00 - 0.07 K/uL Final   Performed at [facility], [address] [place], [place] [address]    Assessment:  Jessica Cline is a 53 y.o. female with cirrhosis and associated moderate thrombocytopenia and a lupus anticoagulant.  Platelet count fluctuates between 36,000 and 50,000.  She has a low fibrinogen  secondary to her liver disease.    Work-up on 04/10/2014 revealed a platelet count in a blue top tube was 46,000 (rule out pseudothrombocytopenia).  Negative labs included an ANA, ferritin, iron studies, B12, folate, SPEP (apparent polyclonal gammopathy), UPEP, hepatitis  B surface antigen, hepatitis C RNA, and HIV testing.  Fibrinogen was 207 (210-470).  PTT was elevated at 45.3 (23.6-35.9) suggestive of a possible factor deficiency or inhibitor.  Additional testing on 04/20/2014 confirmed a lupus anticoagulant.  Lupus anticoagulant testing was positive again on 01/25/2015.  Abdominal ultrasound on 04/12/2014 revealed prominent left hepatic lobe with mild nodularity of the liver and splenomegaly (16.9 cm), query cirrhosis. No focal liver mass was identified.  RUQ ultrasound on 11/25/2017 revealed a cirrhotic appearance of the liver.  There were no focal masses.  There was normal direction of flow of the main portal vein. Gallbladder wall thickening measured 4 mm and may represent acalculus cholecystitis or may be reactive due to the chronic liver disease and/or hypoalbuminemia.  EGD in 03/2014 was normal per patient's report.  She received the hepatitis B vaccine series.  She is no longer considering a liver transplant.    AFP has been followed:  4.9 on 04/17/2015, 4.1 on 08/07/2015, 5.0 on 04/14/2016, 5.2 on 10/13/2016, 5.8 on 04/16/2017, 4.8 on 10/15/2017, and 4.7 on 04/23/2018.  Chest CT on 06/01/2014 revealed an abnormal attenuation in the right lobe of the liver for which more definitive characterization by hepatic MRI was suggested to exclude malignancy.  She notes a liver MRI in [facility] this year.    She presented with a vaginal hemorrhage on 01/05/2015.  She required 3 units of PRBCs and 1 unit pheresed platelets.  She had an severe allergic reaction to platelets.  She will require premedications of Benadryl +/- steroids.  PT was 13.1 (INR 1.18) and PTT was 28.3. Fibrinogen was 158 (208-409).    Von Willebrand testing was negative.  Platelet function assay was c/w aspirin effect.  She was initially considered for hysterectomy versus uterine ablation.  She had no further heavy menses.  Symptomatically, she denies any new complaints.  Platelet count is 35,000.  Bilirubin is 2.2.  AFP is 4.7.  Plan: 1.   Labs today: CBC with diff, CMP, direct bilirubin, AFP. 2.   Thrombocytopenia  Platelet count today 35,000.  Platelet count 46,000 - 55,000 over the past year.  Etiology secondary to underlying liver disease and splenomegaly.  Continue surveillance. 3.   Cirrhosis  Patient recently seen by [facility].  Continue surveillance for hepatocellular carcinoma every 6 months with liver imaging and AFP.  Liver MRI planned by GI. 4.   Schedule RUQ ultrasound on 05/26/2018.  5.   Discuss transition to [place].  Patient wishes to remain in [place]. 6.   RTC in 3 months for MD (Dr Smith Robert) assessment, labs (CBC with diff, CMP, direct bilirubin, PT/INR), and review of RUQ ultrasound.  I discussed the assessment and treatment plan with the patient.  The patient was provided an opportunity to ask questions and all were answered.  The patient agreed with the plan and demonstrated an understanding of the instructions.  The patient was advised to call back or seek an in person evaluation if the symptoms worsen or if the condition fails to improve as anticipated.    Rosey Bath, MD  04/23/2018, 4:35 PM

## 2018-04-24 LAB — AFP TUMOR MARKER: AFP, Serum, Tumor Marker: 4.7 ng/mL (ref 0.0–8.3)

## 2018-05-26 ENCOUNTER — Ambulatory Visit
Admission: RE | Admit: 2018-05-26 | Discharge: 2018-05-26 | Disposition: A | Discharge status: Home / Self Care | Payer: Medicare Other | Source: Ambulatory Visit | Attending: Hematology and Oncology | Admitting: Hematology and Oncology

## 2018-05-26 ENCOUNTER — Other Ambulatory Visit: Payer: Self-pay

## 2018-05-26 DIAGNOSIS — K7469 Other cirrhosis of liver: Secondary | ICD-10-CM

## 2018-07-15 ENCOUNTER — Telehealth: Payer: Self-pay | Admitting: Oncology

## 2018-07-16 ENCOUNTER — Other Ambulatory Visit: Payer: Self-pay

## 2018-07-16 ENCOUNTER — Inpatient Hospital Stay (HOSPITAL_BASED_OUTPATIENT_CLINIC_OR_DEPARTMENT_OTHER): Payer: Medicare Other | Admitting: Oncology

## 2018-07-16 ENCOUNTER — Inpatient Hospital Stay: Payer: Medicare Other | Attending: Oncology

## 2018-07-16 ENCOUNTER — Encounter: Payer: Self-pay | Admitting: Oncology

## 2018-07-16 DIAGNOSIS — K7469 Other cirrhosis of liver: Secondary | ICD-10-CM | POA: Insufficient documentation

## 2018-07-16 DIAGNOSIS — D696 Thrombocytopenia, unspecified: Secondary | ICD-10-CM | POA: Insufficient documentation

## 2018-07-16 DIAGNOSIS — Z79899 Other long term (current) drug therapy: Secondary | ICD-10-CM | POA: Diagnosis not present

## 2018-07-16 LAB — CBC WITH DIFFERENTIAL/PLATELET
Abs Immature Granulocytes: 0 10*3/uL (ref 0.00–0.07)
Basophils Absolute: 0 10*3/uL (ref 0.0–0.1)
Basophils Relative: 0 %
Eosinophils Absolute: 0.1 10*3/uL (ref 0.0–0.5)
Eosinophils Relative: 2 %
HCT: 39 % (ref 36.0–46.0)
Hemoglobin: 12.7 g/dL (ref 12.0–15.0)
Immature Granulocytes: 0 %
Lymphocytes Relative: 17 %
Lymphs Abs: 0.7 10*3/uL (ref 0.7–4.0)
MCH: 28.7 pg (ref 26.0–34.0)
MCHC: 32.6 g/dL (ref 30.0–36.0)
MCV: 88.2 fL (ref 80.0–100.0)
Monocytes Absolute: 0.3 10*3/uL (ref 0.1–1.0)
Monocytes Relative: 8 %
Neutro Abs: 2.9 10*3/uL (ref 1.7–7.7)
Neutrophils Relative %: 73 %
Platelets: 30 10*3/uL — ABNORMAL LOW (ref 150–400)
RBC: 4.42 MIL/uL (ref 3.87–5.11)
RDW: 14.6 % (ref 11.5–15.5)
WBC: 4 10*3/uL (ref 4.0–10.5)
nRBC: 0 % (ref 0.0–0.2)

## 2018-07-16 LAB — COMPREHENSIVE METABOLIC PANEL
ALT: 42 U/L (ref 0–44)
AST: 65 U/L — ABNORMAL HIGH (ref 15–41)
Albumin: 3.8 g/dL (ref 3.5–5.0)
Alkaline Phosphatase: 142 U/L — ABNORMAL HIGH (ref 38–126)
Anion gap: 6 (ref 5–15)
BUN: 11 mg/dL (ref 6–20)
CO2: 26 mmol/L (ref 22–32)
Calcium: 9 mg/dL (ref 8.9–10.3)
Chloride: 106 mmol/L (ref 98–111)
Creatinine, Ser: 0.66 mg/dL (ref 0.44–1.00)
GFR calc Af Amer: >60 mL/min (ref >60)
GFR calc non Af Amer: >60 mL/min (ref >60)
Glucose, Bld: 120 mg/dL — ABNORMAL HIGH (ref 70–99)
Potassium: 3.8 mmol/L (ref 3.5–5.1)
Sodium: 138 mmol/L (ref 135–145)
Total Bilirubin: 1.7 mg/dL — ABNORMAL HIGH (ref 0.3–1.2)
Total Protein: 7 g/dL (ref 6.5–8.1)

## 2018-07-16 LAB — BILIRUBIN, DIRECT: Bilirubin, Direct: 0.7 mg/dL — ABNORMAL HIGH (ref 0.0–0.2)

## 2018-07-16 LAB — PROTIME-INR
INR: 1.2 (ref 0.8–1.2)
Prothrombin Time: 14.7 seconds (ref 11.4–15.2)

## 2018-07-16 NOTE — Progress Notes (Signed)
Pt does not have any bleeding or bruising. She has just got in with Bath in [place]-Dr. Cala Bradford beavers

## 2018-07-19 NOTE — Progress Notes (Signed)
I connected with Jessica Cline on 07/19/18 at 10:30 AM EDT by telephone visit and verified that I am speaking with the correct person using two identifiers.   I discussed the limitations, risks, security and privacy concerns of performing an evaluation and management service by telemedicine and the availability of in-person appointments. I also discussed with the patient that there may be a patient responsible charge related to this service. The patient expressed understanding and agreed to proceed.  Other persons participating in the visit and their role in the encounter:  none  Patient's location:  Home Provider's location:  [facility]  Chief Complaint:  Routine f/u of thrombocytopenia  History of present illness: Patient is a 53 year old female with a history of chronic liver disease and cirrhosis likely secondary to [place].  She is seen in [facility] for this.  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  She had previously seen Dr. Merlene Pullingorcoran and transferring her care to me.  She has a history of positive lupus anticoagulant but no history of thrombosis.  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  Hepatitis B C and HIV testing was negative.  She had also presented with vaginal hemorrhage back in 2016.  She required PRBC and platelet transfusion at that time and developed an allergic reaction to platelets.  She will subsequently require premedications with Benadryl and steroids.  Von Willebrand panel testing was negative.  Interval history denies any new bleeding or bruising. Appetite and weight are stable.   Review of Systems  Constitutional: Positive for malaise/fatigue. Negative for chills, fever and weight loss.  HENT: Negative for congestion, ear discharge and nosebleeds.   Eyes: Negative for blurred vision.  Respiratory: Negative for cough, hemoptysis, sputum production, shortness of breath and wheezing.   Cardiovascular: Negative for chest pain, palpitations,  orthopnea and claudication.  Gastrointestinal: Negative for abdominal pain, blood in stool, constipation, diarrhea, heartburn, melena, nausea and vomiting.  Genitourinary: Negative for dysuria, flank pain, frequency, hematuria and urgency.  Musculoskeletal: Negative for back pain, joint pain and myalgias.  Skin: Negative for rash.  Neurological: Negative for dizziness, tingling, focal weakness, seizures, weakness and headaches.  Endo/Heme/Allergies: Does not bruise/bleed easily.  Psychiatric/Behavioral: Negative for depression and suicidal ideas. The patient does not have insomnia.     Allergies  Allergen Reactions  . Plasma, Human Swelling    Past Medical History:  Diagnosis Date  . Anxiety   . Arthritis   . Blood transfusion without reported diagnosis   . Depression   . HTN (hypertension)     Past Surgical History:  Procedure Laterality Date  . ANKLE SURGERY Left   . KNEE ARTHROSCOPY Left   . OVARY SURGERY Right    due to cyst or tumor  . PITUITARY SURGERY     tumor removed  . thumb SURGERY    . TONSILLECTOMY    . WRIST SURGERY Left     Social History   Socioeconomic History  . Marital status: Single    Spouse name: Not on file  . Number of children: 0  . Years of education: Not on file  . Highest education level: Not on file  Occupational History  . Occupation: retired  Engineer, productionocial Needs  . Financial resource strain: Not on file  . Food insecurity:    Worry: Not on file    Inability: Not on file  . Transportation needs:    Medical: Not on file    Non-medical: Not on file  Tobacco Use  . Smoking status:  Never Smoker  . Smokeless tobacco: Current User    Types: Snuff  Substance and Sexual Activity  . Alcohol use: Not Currently    Alcohol/week: 0.0 standard drinks  . Drug use: Yes    Types: Marijuana  . Sexual activity: Not on file  Lifestyle  . Physical activity:    Days per week: Not on file    Minutes per session: Not on file  . Stress: Not on file   Relationships  . Social connections:    Talks on phone: Not on file    Gets together: Not on file    Attends religious service: Not on file    Active member of club or organization: Not on file    Attends meetings of clubs or organizations: Not on file    Relationship status: Not on file  . Intimate partner violence:    Fear of current or ex partner: Not on file    Emotionally abused: Not on file    Physically abused: Not on file    Forced sexual activity: Not on file  Other Topics Concern  . Not on file  Social History Narrative  . Not on file    Family History  Problem Relation Age of Onset  . Pancreatic cancer Father   . Breast cancer Maternal Grandmother      Current Outpatient Medications:  .  acetaminophen (TYLENOL) 325 MG tablet, Take 650 mg by mouth every 6 (six) hours as needed., Disp: , Rfl:  .  albuterol (PROVENTIL HFA;VENTOLIN HFA) 108 (90 Base) MCG/ACT inhaler, Inhale 2 puffs into the lungs every 6 (six) hours as needed. Reported on 08/07/2015, Disp: , Rfl:  .  amLODipine (NORVASC) 10 MG tablet, Take 10 mg by mouth daily. , Disp: , Rfl:  .  celecoxib (CELEBREX) 100 MG capsule, Take 100 mg by mouth daily., Disp: , Rfl:  .  desmopressin (DDAVP) 0.01 % SOLN, Place 1 spray into the nose 2 (two) times daily. , Disp: , Rfl:  .  sertraline (ZOLOFT) 100 MG tablet, Take 200 mg by mouth daily. , Disp: , Rfl:  .  traZODone (DESYREL) 100 MG tablet, Take 200 mg by mouth at bedtime. , Disp: , Rfl:     CMP Latest Ref Rng & Units 07/16/2018  Glucose 70 - 99 mg/dL 308(M)  BUN 6 - 20 mg/dL 11  Creatinine 5.78 - 4.69 mg/dL 6.29  Sodium 528 - 413 mmol/L 138  Potassium 3.5 - 5.1 mmol/L 3.8  Chloride 98 - 111 mmol/L 106  CO2 22 - 32 mmol/L 26  Calcium 8.9 - 10.3 mg/dL 9.0  Total Protein 6.5 - 8.1 g/dL 7.0  Total Bilirubin 0.3 - 1.2 mg/dL 2.4(M)  Alkaline Phos 38 - 126 U/L 142(H)  AST 15 - 41 U/L 65(H)  ALT 0 - 44 U/L 42   CBC Latest Ref Rng & Units 07/16/2018  WBC 4.0 -  10.5 K/uL 4.0  Hemoglobin 12.0 - 15.0 g/dL 01.0  Hematocrit 27.2 - 46.0 % 39.0  Platelets 150 - 400 K/uL 30(L)     Assessment and plan: Patient is a 53 year old female with a history of cirrhosis here for Follow-up of thrombocytopenia secondary to chronic liver disease  Patient's platelet count has been waxing and waning between 30s to 50s over the last 4 years.  Today her platelet count is 30.  Her white count and hemoglobin is normal.  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  We will  continue to monitor her thrombocytopenia and as such there would be no indication for treatment unless she has any symptoms of active bleeding.  Follow-up instructions: I will see the patient back in 4 months time with CBC, CMP, PT/INR  I discussed the assessment and treatment plan with the patient. The patient was provided an opportunity to ask questions and all were answered. The patient agreed with the plan and demonstrated an understanding of the instructions.   The patient was advised to call back or seek an in-person evaluation if the symptoms worsen or if the condition fails to improve as anticipated.  I provided 15 minutes of non face-to-face telephone visit time during this encounter, and > 50% was spent counseling as documented under my assessment & plan.  Visit Diagnosis: 1. Thrombocytopenia (HCC)   2. Other cirrhosis of liver (HCC)     Dr. Adalee Kathan, MD, MPH [facility] Pager- [phone] 07/19/2018 9:53 AM

## 2018-07-19 NOTE — Progress Notes (Signed)
Please schedule an appointment with me to review the ultrasound results. Thank you.

## 2018-07-20 NOTE — Telephone Encounter (Signed)
I sent inbasket message with u/s results to patient's Gi md dr. Orvan Falconer.

## 2018-07-26 ENCOUNTER — Other Ambulatory Visit: Payer: Self-pay

## 2018-07-26 ENCOUNTER — Ambulatory Visit (INDEPENDENT_AMBULATORY_CARE_PROVIDER_SITE_OTHER): Payer: Medicare Other | Admitting: Gastroenterology

## 2018-07-26 ENCOUNTER — Encounter: Payer: Self-pay | Admitting: Gastroenterology

## 2018-07-26 VITALS — Ht 65.7 in

## 2018-07-26 DIAGNOSIS — R188 Other ascites: Secondary | ICD-10-CM | POA: Diagnosis not present

## 2018-07-26 DIAGNOSIS — K7469 Other cirrhosis of liver: Secondary | ICD-10-CM | POA: Diagnosis not present

## 2018-07-26 DIAGNOSIS — D696 Thrombocytopenia, unspecified: Secondary | ICD-10-CM | POA: Diagnosis not present

## 2018-07-26 MED ORDER — SPIRONOLACTONE 50 MG PO TABS: 50.0000 mg | ORAL_TABLET | Freq: Every day | ORAL | 3 refills | Status: DC

## 2018-07-26 NOTE — Progress Notes (Signed)
TELEHEALTH VISIT  Referring Provider: Prudencio Burly, MD Primary Care Physician:  Prudencio Burly, MD   Tele-visit due to COVID-19 pandemic Jessica Cline requested visit virtually, consented to the virtual encounter via video enabled telemedicine application Contact made at: 14:00 07/26/18 Jessica Cline verified by name and date of birth Location of Jessica Cline: Home Location provider: [place] [facility] Names of persons participating: Jessica Cline, Jessica Cline, Genella Mech CMA Time spent on telehealth visit: 24 minutes I discussed the limitations of evaluation and management by telemedicine. The Jessica Cline expressed understanding and agreed to proceed.  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 associated portal hypertension/thrombocytopenia    - MELD 10    - likely due to prior alcohol +/- fatty liver    - Negative labs for HBV, HCV, HIV, negative iron profile and ferritin, negative ANA    - completed vaccination for Twinrix    - EGD 2016 showed portal gastropathy with no esophageal or gastric varices ([place])    - No history of decompensation    - not interested in liver transplantation Intermittent RUQ pain that may be related to capsular stretch Obesity with BMI 40 History of heavy alcohol use    - drinking a pint daily starting at age 75    - no alcohol in over 20 years Colonoscopy in her 22s at [facility]    - Dr. 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 Now with a small amount of ascites on routine ultrasound. I have personally reviewed the images from the Jessica Cline's ultrasound.  There is not enough ascites for diagnostic paracentesis.  There is no portal vein thrombus or noted hepatocellular carcinoma on the ultrasound. However, there is some decrease in sensitivity and specificity of ultrasound  given Jessica Cline's body habitus. MRI recommended for next hepatocellular carcinoma screening study.  I recommend a diagnostic paracentesis at any point that enough ascites is present for the study to be performed.  Will start low dose diuretics. She is already following a sodium restricted diet. Will add urine sodium to next labs. Follow renal function closely and titrate diuretics as needed. Avoid all NSAIDs to minimize renal dysfunction.    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 Janese Banks for an abnormal ultrasound. I met the Jessica Cline 03/22/18.  The interval history is obtained to the Jessica Cline and review of her electronic health record.  Dr. Janese Banks recently saw the Jessica Cline to evaluate her thrombocytopenia.  A right upper quadrant ultrasound 05/26/2018 showed cirrhosis and a patent portal vein with normal direction of flow.  A small amount of ascites was seen.  Ascites has not been visualized on prior ultrasounds.  She developed lower extremity edema this winter. Worse over the course of the day. Initially seen 4 years ago but it has become more worrisome to her.  No chest pain or shortness of breath. No increasing abdominal  girth.   She stopped eating salt after it was advised as a teenager. She has not previously been on diuretics. She does not like to take medications.  She is not drinking any alcohol. No other associated symptoms. No identified exacerbating or relieving  features.   Labs from 04/23/2018: Na 140, K 3.8, BUN 8, crt 0.68, TB 2.2, DB 0.8, AST 70, ALT 50, alk phos 139, WBC 3.7, hgb 13.2, platelets 35 Labs 07/16/18: Na 138, crt 0.66, TB 1.7, DB 0.7, AST 65, ALT 42, alk phos 142, platelets 30, INR 1.2 for a calculated MELD 10  Today I reviewed prior records from Dr. Paulita Fujita at [facility] gastroenterology.  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  She was given the Twinrix vaccine.  Upper endoscopy in 2016 showed portal gastropathy but no esophageal or gastric varices.  He was recommending a colonoscopy in 2017.  Labs with Dr. Paulita Fujita showed a negative HCVRNA, negative hep B surface antigen, negative HIV, negative iron profile and ferritin, negative ANA.  Past Medical History:  Diagnosis Date  . Anxiety   . Arthritis   . Blood transfusion without reported diagnosis   . Depression   . HTN (hypertension)     Past Surgical History:  Procedure Laterality Date  . ANKLE SURGERY Left   . KNEE ARTHROSCOPY Left   . OVARY SURGERY Right    due to cyst or tumor  . PITUITARY SURGERY     tumor removed  . thumb SURGERY    . TONSILLECTOMY    . WRIST SURGERY Left     Current Outpatient Medications  Medication Sig Dispense Refill  . acetaminophen (TYLENOL) 325 MG tablet Take 650 mg by mouth every 6 (six) hours as needed.    Marland Kitchen albuterol (PROVENTIL HFA;VENTOLIN HFA) 108 (90 Base) MCG/ACT inhaler Inhale 2 puffs into the lungs every 6 (six) hours as needed. Reported on 08/07/2015    . amLODipine (NORVASC) 10 MG tablet Take 10 mg by mouth daily.     . celecoxib (CELEBREX) 100 MG capsule Take 100 mg by mouth daily.    Marland Kitchen desmopressin (DDAVP) 0.01 % SOLN Place 1 spray into the nose 2 (two) times daily.     . sertraline (ZOLOFT) 100 MG tablet Take 200 mg by mouth daily.     . traZODone (DESYREL) 100 MG tablet Take 200 mg by mouth at  bedtime.      No current facility-administered medications for this visit.     Allergies as of 07/26/2018 - Review Complete 07/16/2018  Allergen Reaction Noted  . Plasma, human Swelling 08/07/2015    Family History  Problem Relation Age of Onset  . Pancreatic cancer Father   . Breast cancer Maternal Grandmother     Social History   Socioeconomic History  . Marital status: Single    Spouse name: Not on file  . Number of children: 0  . Years of education: Not on file  . Highest education level: Not on file  Occupational History  . Occupation: retired  Scientific laboratory technician  . Financial resource strain: Not on file  . Food insecurity:    Worry: Not on file    Inability: Not on file  . Transportation needs:    Medical: Not on file    Non-medical: Not on file  Tobacco Use  . Smoking status: Never Smoker  . Smokeless tobacco:  Current User    Types: Snuff  Substance and Sexual Activity  . Alcohol use: Not Currently    Alcohol/week: 0.0 standard drinks  . Drug use: Yes    Types: Marijuana  . Sexual activity: Not on file  Lifestyle  . Physical activity:    Days per week: Not on file    Minutes per session: Not on file  . Stress: Not on file  Relationships  . Social connections:    Talks on phone: Not on file    Gets together: Not on file    Attends religious service: Not on file    Active member of club or organization: Not on file    Attends meetings of clubs or organizations: Not on file    Relationship status: Not on file  . Intimate partner violence:    Fear of current or ex partner: Not on file    Emotionally abused: Not on file    Physically abused: Not on file    Forced sexual activity: Not on file  Other Topics Concern  . Not on file  Social History Narrative  . Not on file    Review of Systems: ALL ROS discussed and all others negative except listed in HPI.  Physical Exam: General: in no acute distress Neuro: Alert and appropriate Psych: Normal affect  and normal insight   Melinda Pottinger L. Tarri Glenn, MD, MPH [facility] 07/26/2018, 2:04 PM

## 2018-07-26 NOTE — Patient Instructions (Addendum)
Continue to follow a sodium restricted diet.  I recommend that you limit your daily sodium to 2000 mg.  Sometimes working with a nutritionist can help minimize the sodium in your diet even further.  Please contact me at any point to arrange a nutrition consult.  I am recommending that you start Aldactone 50 mg daily. Prescription has been sent to your pharmacy.  We will need to kidney function closely while on this medicine.  Please have labs done in 2 weeks. Orders are in for you to have labs drawn in 2 weeks at our [facility]   After reviewing records from Dr. Dulce Sellar, I recommend an EGD to screen for esophageal varices when the coronavirus restrictions are lifted.  Your last EGD was in 2016.  Given your low platelets, another EGD is indicated at this time.  Please avoid all anti-inflammatory medication such as Celebrex.  These can be hard on your kidneys while using medicines like Aldactone.  I have recommended an MRI in August to further monitor your liver.  Let us plan to touch base in 4 weeks to see how the Aldactone is working.  Please call me with any questions prior to that time.  Thank you for your patience with me and our technology today! Please stay home, safe, and healthy.  And, good luck with that lawnmower.

## 2018-11-01 ENCOUNTER — Other Ambulatory Visit: Payer: Self-pay | Admitting: Emergency Medicine

## 2018-11-01 DIAGNOSIS — R188 Other ascites: Secondary | ICD-10-CM

## 2018-11-01 DIAGNOSIS — K7469 Other cirrhosis of liver: Secondary | ICD-10-CM

## 2018-11-10 ENCOUNTER — Ambulatory Visit (HOSPITAL_COMMUNITY): Admission: RE | Admit: 2018-11-10 | Payer: Medicare Other | Source: Ambulatory Visit

## 2018-11-13 ENCOUNTER — Ambulatory Visit (HOSPITAL_COMMUNITY)
Admission: RE | Admit: 2018-11-13 | Discharge: 2018-11-13 | Disposition: A | Discharge status: Home / Self Care | Payer: Medicare Other | Source: Ambulatory Visit | Attending: Gastroenterology | Admitting: Gastroenterology

## 2018-11-13 ENCOUNTER — Other Ambulatory Visit: Payer: Self-pay

## 2018-11-13 DIAGNOSIS — K7469 Other cirrhosis of liver: Secondary | ICD-10-CM

## 2018-11-13 DIAGNOSIS — R188 Other ascites: Secondary | ICD-10-CM

## 2018-11-15 ENCOUNTER — Encounter: Payer: Self-pay | Admitting: Oncology

## 2018-11-15 ENCOUNTER — Inpatient Hospital Stay: Payer: Medicare Other

## 2018-11-15 ENCOUNTER — Inpatient Hospital Stay: Payer: Medicare Other | Attending: Oncology | Admitting: Oncology

## 2018-11-15 ENCOUNTER — Other Ambulatory Visit: Payer: Self-pay

## 2018-11-15 VITALS — BP 133/93 | HR 71 | Temp 96.2°F | Resp 16 | Ht 65.0 in | Wt 254.0 lb

## 2018-11-15 DIAGNOSIS — K7460 Unspecified cirrhosis of liver: Secondary | ICD-10-CM

## 2018-11-15 DIAGNOSIS — D696 Thrombocytopenia, unspecified: Secondary | ICD-10-CM | POA: Diagnosis not present

## 2018-11-15 DIAGNOSIS — K7469 Other cirrhosis of liver: Secondary | ICD-10-CM

## 2018-11-15 DIAGNOSIS — D6862 Lupus anticoagulant syndrome: Secondary | ICD-10-CM | POA: Insufficient documentation

## 2018-11-15 LAB — COMPREHENSIVE METABOLIC PANEL
ALT: 40 U/L (ref 0–44)
AST: 52 U/L — ABNORMAL HIGH (ref 15–41)
Albumin: 3.7 g/dL (ref 3.5–5.0)
Alkaline Phosphatase: 101 U/L (ref 38–126)
Anion gap: 9 (ref 5–15)
BUN: 16 mg/dL (ref 6–20)
CO2: 25 mmol/L (ref 22–32)
Calcium: 8.7 mg/dL — ABNORMAL LOW (ref 8.9–10.3)
Chloride: 107 mmol/L (ref 98–111)
Creatinine, Ser: 0.76 mg/dL (ref 0.44–1.00)
GFR calc Af Amer: >60 mL/min (ref >60)
GFR calc non Af Amer: >60 mL/min (ref >60)
Glucose, Bld: 103 mg/dL — ABNORMAL HIGH (ref 70–99)
Potassium: 4.3 mmol/L (ref 3.5–5.1)
Sodium: 141 mmol/L (ref 135–145)
Total Bilirubin: 1.9 mg/dL — ABNORMAL HIGH (ref 0.3–1.2)
Total Protein: 6.7 g/dL (ref 6.5–8.1)

## 2018-11-15 LAB — CBC WITH DIFFERENTIAL/PLATELET
Abs Immature Granulocytes: 0.02 10*3/uL (ref 0.00–0.07)
Basophils Absolute: 0 10*3/uL (ref 0.0–0.1)
Basophils Relative: 1 %
Eosinophils Absolute: 0.1 10*3/uL (ref 0.0–0.5)
Eosinophils Relative: 2 %
HCT: 39.1 % (ref 36.0–46.0)
Hemoglobin: 13 g/dL (ref 12.0–15.0)
Immature Granulocytes: 1 %
Lymphocytes Relative: 25 %
Lymphs Abs: 0.8 10*3/uL (ref 0.7–4.0)
MCH: 29.1 pg (ref 26.0–34.0)
MCHC: 33.2 g/dL (ref 30.0–36.0)
MCV: 87.5 fL (ref 80.0–100.0)
Monocytes Absolute: 0.3 10*3/uL (ref 0.1–1.0)
Monocytes Relative: 8 %
Neutro Abs: 2.1 10*3/uL (ref 1.7–7.7)
Neutrophils Relative %: 63 %
Platelets: 28 10*3/uL — CL (ref 150–400)
RBC: 4.47 MIL/uL (ref 3.87–5.11)
RDW: 15.1 % (ref 11.5–15.5)
WBC: 3 10*3/uL — ABNORMAL LOW (ref 4.0–10.5)
nRBC: 0 % (ref 0.0–0.2)

## 2018-11-15 LAB — PROTIME-INR
INR: 1.2 (ref 0.8–1.2)
Prothrombin Time: 15.2 seconds (ref 11.4–15.2)

## 2018-11-15 LAB — BILIRUBIN, DIRECT: Bilirubin, Direct: 0.6 mg/dL — ABNORMAL HIGH (ref 0.0–0.2)

## 2018-11-15 NOTE — Progress Notes (Signed)
Hematology/Oncology Consult note [facility]  [phone] [phone]  Patient Care Team: Prudencio Burly, MD as PCP - General (Family Medicine) Arta Silence, MD as Consulting Physician (Gastroenterology)   Name of the patient: Jessica Cline  [id number]  29-Mar-1965   Date of visit: 11/15/18  Diagnosis-thrombocytopenia likely secondary to cirrhosis of the liver  Chief complaint/ Reason for visit-routine follow-up of thrombocytopenia  Heme/Onc history: Patient is a 53 year old female with a history of chronic liver disease and cirrhosis likely secondary to [place].  She is seen in [place] for this.  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  She had previously seen Dr. Mike Gip and transferring her care to me.  She has a history of positive lupus anticoagulant but no history of thrombosis.  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  Hepatitis B C and HIV testing was negative.  She had also presented with vaginal hemorrhage back in 2016.  She required PRBC and platelet transfusion at that time and developed an allergic reaction to platelets.  She will subsequently require premedications with Benadryl and steroids.  Von Willebrand panel testing was negative.  Interval history-she reports feeling off balance at times but has not had any falls.  She continues to follow-up with Dr. Lisabeth Register for her cirrhosis.  Denies any bleeding or bruising other than minor bruises noted over her forearms.  She has not been taking any diuretics at this time.  ECOG PS- 1 Pain scale- 0   Review of systems- Review of Systems  Constitutional: Positive for malaise/fatigue. Negative for chills, fever and weight loss.  HENT: Negative for congestion, ear discharge and nosebleeds.   Eyes: Negative for blurred vision.  Respiratory: Negative for cough, hemoptysis, sputum production, shortness of breath and wheezing.   Cardiovascular: Negative for chest pain,  palpitations, orthopnea and claudication.  Gastrointestinal: Negative for abdominal pain, blood in stool, constipation, diarrhea, heartburn, melena, nausea and vomiting.  Genitourinary: Negative for dysuria, flank pain, frequency, hematuria and urgency.  Musculoskeletal: Negative for back pain, joint pain and myalgias.  Skin: Negative for rash.  Neurological: Negative for dizziness, tingling, focal weakness, seizures, weakness and headaches.  Endo/Heme/Allergies: Does not bruise/bleed easily.  Psychiatric/Behavioral: Negative for depression and suicidal ideas. The patient does not have insomnia.       Allergies  Allergen Reactions  . Plasma, Human Swelling     Past Medical History:  Diagnosis Date  . Anxiety   . Arthritis   . Blood transfusion without reported diagnosis   . Depression   . HTN (hypertension)      Past Surgical History:  Procedure Laterality Date  . ANKLE SURGERY Left   . KNEE ARTHROSCOPY Left   . OVARY SURGERY Right    due to cyst or tumor  . PITUITARY SURGERY     tumor removed  . thumb SURGERY    . TONSILLECTOMY    . WRIST SURGERY Left     Social History   Socioeconomic History  . Marital status: Single    Spouse name: Not on file  . Number of children: 0  . Years of education: Not on file  . Highest education level: Not on file  Occupational History  . Occupation: retired  Scientific laboratory technician  . Financial resource strain: Not on file  . Food insecurity    Worry: Not on file    Inability: Not on file  . Transportation needs    Medical: Not on file    Non-medical: Not on file  Tobacco Use  . Smoking status: Never Smoker  . Smokeless tobacco: Current User    Types: Snuff  Substance and Sexual Activity  . Alcohol use: Not Currently    Alcohol/week: 0.0 standard drinks  . Drug use: Yes    Types: Marijuana  . Sexual activity: Not on file  Lifestyle  . Physical activity    Days per week: Not on file    Minutes per session: Not on file  .  Stress: Not on file  Relationships  . Social Herbalist on phone: Not on file    Gets together: Not on file    Attends religious service: Not on file    Active member of club or organization: Not on file    Attends meetings of clubs or organizations: Not on file    Relationship status: Not on file  . Intimate partner violence    Fear of current or ex partner: Not on file    Emotionally abused: Not on file    Physically abused: Not on file    Forced sexual activity: Not on file  Other Topics Concern  . Not on file  Social History Narrative  . Not on file    Family History  Problem Relation Age of Onset  . Pancreatic cancer Father   . Breast cancer Maternal Grandmother      Current Outpatient Medications:  .  acetaminophen (TYLENOL) 325 MG tablet, Take 650 mg by mouth every 6 (six) hours as needed., Disp: , Rfl:  .  albuterol (PROVENTIL HFA;VENTOLIN HFA) 108 (90 Base) MCG/ACT inhaler, Inhale 2 puffs into the lungs every 6 (six) hours as needed. Reported on 08/07/2015, Disp: , Rfl:  .  amLODipine (NORVASC) 10 MG tablet, Take 10 mg by mouth daily. , Disp: , Rfl:  .  celecoxib (CELEBREX) 100 MG capsule, Take 100 mg by mouth daily., Disp: , Rfl:  .  desmopressin (DDAVP) 0.01 % SOLN, Place 1 spray into the nose 2 (two) times daily. , Disp: , Rfl:  .  sertraline (ZOLOFT) 100 MG tablet, Take 200 mg by mouth daily. , Disp: , Rfl:  .  traZODone (DESYREL) 100 MG tablet, Take 200 mg by mouth at bedtime. , Disp: , Rfl:  .  spironolactone (ALDACTONE) 50 MG tablet, Take 1 tablet (50 mg total) by mouth daily. (Patient not taking: Reported on 11/15/2018), Disp: 30 tablet, Rfl: 3  Physical exam:  Vitals:   11/15/18 1019  BP: (!) 133/93  Pulse: 71  Resp: 16  Temp: (!) 96.2 F (35.7 C)  TempSrc: Tympanic  Weight: 254 lb (115.2 kg)  Height: _0  (1.651 m)   Physical Exam Constitutional:      Appearance: She is obese.  HENT:     Head: Normocephalic and atraumatic.  Eyes:      Pupils: Pupils are equal, round, and reactive to light.  Neck:     Musculoskeletal: Normal range of motion.  Cardiovascular:     Rate and Rhythm: Normal rate and regular rhythm.     Heart sounds: Normal heart sounds.  Pulmonary:     Effort: Pulmonary effort is normal.     Breath sounds: Normal breath sounds.  Abdominal:     General: Bowel sounds are normal.     Palpations: Abdomen is soft.  Skin:    General: Skin is warm and dry.  Neurological:     Mental Status: She is alert and oriented to person, place, and time.  CMP Latest Ref Rng & Units 11/15/2018  Glucose 70 - 99 mg/dL 103(H)  BUN 6 - 20 mg/dL 16  Creatinine 0.44 - 1.00 mg/dL 0.76  Sodium 135 - 145 mmol/L 141  Potassium 3.5 - 5.1 mmol/L 4.3  Chloride 98 - 111 mmol/L 107  CO2 22 - 32 mmol/L 25  Calcium 8.9 - 10.3 mg/dL 8.7(L)  Total Protein 6.5 - 8.1 g/dL 6.7  Total Bilirubin 0.3 - 1.2 mg/dL 1.9(H)  Alkaline Phos 38 - 126 U/L 101  AST 15 - 41 U/L 52(H)  ALT 0 - 44 U/L 40   CBC Latest Ref Rng & Units 11/15/2018  WBC 4.0 - 10.5 K/uL 3.0(L)  Hemoglobin 12.0 - 15.0 g/dL 13.0  Hematocrit 36.0 - 46.0 % 39.1  Platelets 150 - 400 K/uL 28(LL)     Assessment and plan- Patient is a 53 y.o. female with cirrhosis of liver here for routine follow up of thrombocytopenia  Patients platelet count was in the 50's in 2018 then drifted down to 30-40 in 2019. It is down to 28 today. I suspect this is due to her cirrhosis. However if there is a further decline in her platelet count I would consider a bone marrow biopsy to rule out primary bone marrow disorder.  It would be unusual for cirrhosis to present with platelet count less than 20 especially given that her PT/INR and albumin are normal.  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  Also with regards to colonoscopy: If colonoscopy is warranted in her case- avatrombobag can be consideered 10 -13 days prior to her  colonoscopy for 5 days to raise her platelet count  CBC with differential in 1 month and 2 months and I will see her back in 2 months time.  I will also check her B12 and folate levels in 1 month   Visit Diagnosis 1. Cirrhosis of liver without ascites, unspecified hepatic cirrhosis type ([place])   2. Thrombocytopenia ([place])      Dr. Randa Evens, MD, MPH [facility] [phone] 11/15/2018 11:38 AM

## 2018-12-01 ENCOUNTER — Ambulatory Visit (HOSPITAL_COMMUNITY)
Admission: RE | Admit: 2018-12-01 | Discharge: 2018-12-01 | Disposition: A | Discharge status: Home / Self Care | Payer: Medicare Other | Source: Ambulatory Visit | Attending: Gastroenterology | Admitting: Gastroenterology

## 2018-12-01 ENCOUNTER — Other Ambulatory Visit: Payer: Self-pay

## 2018-12-01 DIAGNOSIS — K7469 Other cirrhosis of liver: Secondary | ICD-10-CM | POA: Diagnosis present

## 2018-12-01 DIAGNOSIS — R188 Other ascites: Secondary | ICD-10-CM | POA: Diagnosis not present

## 2018-12-01 MED ORDER — GADOBUTROL 1 MMOL/ML IV SOLN
10.0000 mL | Freq: Once | INTRAVENOUS | Status: AC | PRN
  Administered 2018-12-01: 10 mL via INTRAVENOUS

## 2018-12-06 ENCOUNTER — Telehealth: Payer: Self-pay | Admitting: Gastroenterology

## 2018-12-06 NOTE — Telephone Encounter (Signed)
Pt stated that she is returning your call.  °

## 2018-12-06 NOTE — Telephone Encounter (Signed)
Refer to result note 

## 2018-12-16 ENCOUNTER — Inpatient Hospital Stay: Payer: Medicare Other | Attending: Oncology

## 2018-12-16 ENCOUNTER — Other Ambulatory Visit: Payer: Self-pay

## 2018-12-16 DIAGNOSIS — K7460 Unspecified cirrhosis of liver: Secondary | ICD-10-CM | POA: Diagnosis not present

## 2018-12-16 DIAGNOSIS — D696 Thrombocytopenia, unspecified: Secondary | ICD-10-CM | POA: Diagnosis not present

## 2018-12-16 DIAGNOSIS — R5382 Chronic fatigue, unspecified: Secondary | ICD-10-CM | POA: Insufficient documentation

## 2018-12-16 LAB — CBC WITH DIFFERENTIAL/PLATELET
Abs Immature Granulocytes: 0 10*3/uL (ref 0.00–0.07)
Basophils Absolute: 0 10*3/uL (ref 0.0–0.1)
Basophils Relative: 1 %
Eosinophils Absolute: 0.1 10*3/uL (ref 0.0–0.5)
Eosinophils Relative: 3 %
HCT: 40.5 % (ref 36.0–46.0)
Hemoglobin: 13.2 g/dL (ref 12.0–15.0)
Immature Granulocytes: 0 %
Lymphocytes Relative: 24 %
Lymphs Abs: 0.7 10*3/uL (ref 0.7–4.0)
MCH: 28.7 pg (ref 26.0–34.0)
MCHC: 32.6 g/dL (ref 30.0–36.0)
MCV: 88 fL (ref 80.0–100.0)
Monocytes Absolute: 0.3 10*3/uL (ref 0.1–1.0)
Monocytes Relative: 8 %
Neutro Abs: 2 10*3/uL (ref 1.7–7.7)
Neutrophils Relative %: 64 %
Platelets: 28 10*3/uL — CL (ref 150–400)
RBC: 4.6 MIL/uL (ref 3.87–5.11)
RDW: 15.3 % (ref 11.5–15.5)
WBC: 3.1 10*3/uL — ABNORMAL LOW (ref 4.0–10.5)
nRBC: 0 % (ref 0.0–0.2)

## 2018-12-16 LAB — VITAMIN B12: Vitamin B-12: 591 pg/mL (ref 180–914)

## 2018-12-16 LAB — FOLATE: Folate: 19.1 ng/mL (ref >5.9)

## 2018-12-17 ENCOUNTER — Encounter: Payer: Self-pay | Admitting: Oncology

## 2018-12-17 ENCOUNTER — Inpatient Hospital Stay: Payer: Medicare Other | Admitting: Oncology

## 2018-12-17 ENCOUNTER — Telehealth: Payer: Self-pay | Admitting: *Deleted

## 2018-12-17 NOTE — Telephone Encounter (Signed)
Called pt today to do the video call and she states that her phone does not have video capability. I checked with Jessica Cline and she says then pt needs to come in Monday. Called pt. Back and made an appt with her for 10 am on Monday 9/28. Pt agreeable to the appt

## 2018-12-20 ENCOUNTER — Encounter: Payer: Self-pay | Admitting: Oncology

## 2018-12-20 ENCOUNTER — Inpatient Hospital Stay (HOSPITAL_BASED_OUTPATIENT_CLINIC_OR_DEPARTMENT_OTHER): Payer: Medicare Other | Admitting: Oncology

## 2018-12-20 ENCOUNTER — Other Ambulatory Visit: Payer: Self-pay

## 2018-12-20 VITALS — BP 118/63 | HR 78 | Temp 98.1°F | Ht 65.0 in | Wt 260.0 lb

## 2018-12-20 DIAGNOSIS — D696 Thrombocytopenia, unspecified: Secondary | ICD-10-CM | POA: Diagnosis not present

## 2018-12-20 DIAGNOSIS — K7460 Unspecified cirrhosis of liver: Secondary | ICD-10-CM

## 2018-12-20 NOTE — Progress Notes (Signed)
Hematology/Oncology Consult note [facility]  [phone] [phone]  Patient Care Team: Prudencio Burly, MD as PCP - General (Family Medicine) Arta Silence, MD as Consulting Physician (Gastroenterology)   Name of the patient: Jessica Cline  [id number]  10-Jul-1965   Date of visit: 12/20/18  Diagnosis- thrombocytopenia likely secondary to cirrhosis of the liver  Chief complaint/ Reason for visit-routine follow-up of thrombocytopenia  Heme/Onc history: Patient is a 53 year old female with a history of chronic liver disease and cirrhosis likely secondary to [place]. She is seen in [place] for this. 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 She had previously seen Dr. Mike Gip and transferring her care to me. She has a history of positive lupus anticoagulant but no history of thrombosis. 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 Hepatitis B C and HIV testing was negative. She had also presented with vaginal hemorrhage back in 2016. She required PRBC and platelet transfusion at that time and developed an allergic reaction to platelets. She will subsequently require premedications with Benadryl and steroids. Von Willebrand panel testing was negative.  Interval history-she has chronic fatigue but denies other complaints.  Denies any bleeding or bruising.  ECOG PS- 1 Pain scale- 0   Review of systems- Review of Systems  Constitutional: Positive for malaise/fatigue. Negative for chills, fever and weight loss.  HENT: Negative for congestion, ear discharge and nosebleeds.   Eyes: Negative for blurred vision.  Respiratory: Negative for cough, hemoptysis, sputum production, shortness of breath and wheezing.   Cardiovascular: Negative for chest pain, palpitations, orthopnea and claudication.  Gastrointestinal: Negative for abdominal pain, blood in stool, constipation, diarrhea, heartburn, melena, nausea and vomiting.   Genitourinary: Negative for dysuria, flank pain, frequency, hematuria and urgency.  Musculoskeletal: Negative for back pain, joint pain and myalgias.  Skin: Negative for rash.  Neurological: Negative for dizziness, tingling, focal weakness, seizures, weakness and headaches.  Endo/Heme/Allergies: Does not bruise/bleed easily.  Psychiatric/Behavioral: Negative for depression and suicidal ideas. The patient does not have insomnia.       Allergies  Allergen Reactions  . Plasma, Human Swelling     Past Medical History:  Diagnosis Date  . Anxiety   . Arthritis   . Blood transfusion without reported diagnosis   . Depression   . HTN (hypertension)   . Thrombocytopenia ([place])      Past Surgical History:  Procedure Laterality Date  . ANKLE SURGERY Left   . KNEE ARTHROSCOPY Left   . OVARY SURGERY Right    due to cyst or tumor  . PITUITARY SURGERY     tumor removed  . thumb SURGERY    . TONSILLECTOMY    . WRIST SURGERY Left     Social History   Socioeconomic History  . Marital status: Single    Spouse name: Not on file  . Number of children: 0  . Years of education: Not on file  . Highest education level: Not on file  Occupational History  . Occupation: retired  Scientific laboratory technician  . Financial resource strain: Not on file  . Food insecurity    Worry: Not on file    Inability: Not on file  . Transportation needs    Medical: Not on file    Non-medical: Not on file  Tobacco Use  . Smoking status: Never Smoker  . Smokeless tobacco: Current User    Types: Snuff  Substance and Sexual Activity  . Alcohol use: Not Currently    Alcohol/week: 0.0 standard drinks  .  Drug use: Yes    Types: Marijuana  . Sexual activity: Not on file  Lifestyle  . Physical activity    Days per week: Not on file    Minutes per session: Not on file  . Stress: Not on file  Relationships  . Social Herbalist on phone: Not on file    Gets together: Not on file    Attends religious  service: Not on file    Active member of club or organization: Not on file    Attends meetings of clubs or organizations: Not on file    Relationship status: Not on file  . Intimate partner violence    Fear of current or ex partner: Not on file    Emotionally abused: Not on file    Physically abused: Not on file    Forced sexual activity: Not on file  Other Topics Concern  . Not on file  Social History Narrative  . Not on file    Family History  Problem Relation Age of Onset  . Pancreatic cancer Father   . Breast cancer Maternal Grandmother      Current Outpatient Medications:  .  acetaminophen (TYLENOL) 325 MG tablet, Take 650 mg by mouth every 6 (six) hours as needed., Disp: , Rfl:  .  albuterol (PROVENTIL HFA;VENTOLIN HFA) 108 (90 Base) MCG/ACT inhaler, Inhale 2 puffs into the lungs every 6 (six) hours as needed. Reported on 08/07/2015, Disp: , Rfl:  .  amLODipine (NORVASC) 10 MG tablet, Take 10 mg by mouth daily. , Disp: , Rfl:  .  celecoxib (CELEBREX) 100 MG capsule, Take 100 mg by mouth daily., Disp: , Rfl:  .  clonazePAM (KLONOPIN) 0.5 MG tablet, Take 1 tablet by mouth., Disp: , Rfl:  .  desmopressin (DDAVP) 0.01 % SOLN, Place 1 spray into the nose 2 (two) times daily. , Disp: , Rfl:  .  sertraline (ZOLOFT) 100 MG tablet, Take 200 mg by mouth daily. , Disp: , Rfl:  .  traZODone (DESYREL) 100 MG tablet, Take 200 mg by mouth at bedtime. , Disp: , Rfl:   Physical exam:  Vitals:   12/20/18 0944  BP: 118/63  Pulse: 78  Temp: 98.1 F (36.7 C)  TempSrc: Tympanic  Weight: 260 lb (117.9 kg)  Height: '5\' 5"'$  (1.651 m)   Physical Exam Constitutional:      Appearance: She is obese.  HENT:     Head: Normocephalic and atraumatic.  Eyes:     Pupils: Pupils are equal, round, and reactive to light.  Neck:     Musculoskeletal: Normal range of motion.  Cardiovascular:     Rate and Rhythm: Normal rate and regular rhythm.     Heart sounds: Normal heart sounds.  Pulmonary:      Effort: Pulmonary effort is normal.     Breath sounds: Normal breath sounds.  Abdominal:     General: Bowel sounds are normal.     Palpations: Abdomen is soft.     Comments: Spleen tip is palpable  Musculoskeletal:     Right lower leg: No edema.     Left lower leg: No edema.  Skin:    General: Skin is warm and dry.  Neurological:     Mental Status: She is alert and oriented to person, place, and time.      CMP Latest Ref Rng & Units 11/15/2018  Glucose 70 - 99 mg/dL 103(H)  BUN 6 - 20 mg/dL 16  Creatinine 0.44 -  1.00 mg/dL 0.76  Sodium 135 - 145 mmol/L 141  Potassium 3.5 - 5.1 mmol/L 4.3  Chloride 98 - 111 mmol/L 107  CO2 22 - 32 mmol/L 25  Calcium 8.9 - 10.3 mg/dL 8.7(L)  Total Protein 6.5 - 8.1 g/dL 6.7  Total Bilirubin 0.3 - 1.2 mg/dL 1.9(H)  Alkaline Phos 38 - 126 U/L 101  AST 15 - 41 U/L 52(H)  ALT 0 - 44 U/L 40   CBC Latest Ref Rng & Units 12/16/2018  WBC 4.0 - 10.5 K/uL 3.1(L)  Hemoglobin 12.0 - 15.0 g/dL 13.2  Hematocrit 36.0 - 46.0 % 40.5  Platelets 150 - 400 K/uL 28(LL)    No images are attached to the encounter.  Mr Abdomen W Wo Contrast  Result Date: 12/01/2018 CLINICAL DATA:  Cirrhosis EXAM: MRI ABDOMEN WITHOUT AND WITH CONTRAST TECHNIQUE: Multiplanar multisequence MR imaging of the abdomen was performed both before and after the administration of intravenous contrast. CONTRAST:  55m GADAVIST GADOBUTROL 1 MMOL/ML IV SOLN COMPARISON:  Right upper quadrant ultrasound dated 05/26/2018 FINDINGS: Lower chest: Lung bases are clear. Hepatobiliary: Macronodular cirrhosis. Lace-like T2 hyperintensity, reflecting scattered areas of fibrosis. No suspicious/enhancing hepatic lesions. Gallbladder is unremarkable. No intrahepatic or extrahepatic ductal dilatation. Pancreas:  Within normal limits. Spleen: Splenomegaly, measuring 17.3 cm in maximal craniocaudal dimension. Adrenals/Urinary Tract:  Adrenal glands are within normal limits. Left kidney is mildly inferiorly displaced.  Right kidney is within normal limits. No hydronephrosis. Stomach/Bowel: Stomach is within normal limits. Visualized bowel is unremarkable. Vascular/Lymphatic:  No evidence of abdominal aortic aneurysm. Portal vein is patent.  Small gastroesophageal varices. No suspicious abdominopelvic lymphadenopathy. Other:  No abdominal ascites. Musculoskeletal: Mild degenerative changes of the visualized thoracolumbar spine. IMPRESSION: No findings suspicious for HCC. Cirrhosis. Splenomegaly. Portal vein is patent. Small gastroesophageal varices. No abdominal ascites. Electronically Signed   By: SJulian HyM.D.   On: 12/01/2018 20:41     Assessment and plan- Patient is a 53y.o. female with cirrhosis of the liver here for routine follow-up of thrombocytopenia  Patient has had chronic thrombocytopenia with a platelet count fluctuating between 40s to 50s dating back to at least 2015.  More recently over the last 1 year her platelet counts are slowly drifted down.  Her platelet count is currently 28 today which was the same last month.  Again this is likely secondary to cirrhosis but it is unclear if she has an second process like ITP going on which may worsen her thrombocytopenia potentially.  Patient was noted to have cirrhosis and splenomegaly on her recent MRI.  Synthetic functions of her liver are normal.  Discussed 1 of the 3 options with her today:  1.  Continue to observe CBC closely and repeat CBC with differential in 6 weeks and 12 weeks and I will see her back in 12 weeks 2.  Proceed with empiric Decadron 40 mg x 4 days now 3.  Consider bone marrow biopsy to rule out any other cause of thrombocytopenia.  However given that her white count and hemoglobin are stable and do not suspect a primary bone marrow process going on  Patient does not wish to proceed with steroids or bone marrow biopsy at this time and would like to continue with watchful monitoring.  If her platelet counts however dropped down to  less than 20-I will offer her empiric steroids at that time.  Patient verbalized understanding  CBC with differential in 6 weeks and 12 weeks and I will see her back in  12 weeks   Visit Diagnosis 1. Thrombocytopenia ([place])   2. Cirrhosis of liver without ascites, unspecified hepatic cirrhosis type ([place])      Dr. Randa Evens, MD, MPH [facility] [phone] 12/20/2018 10:25 AM

## 2018-12-30 ENCOUNTER — Encounter: Payer: Self-pay | Admitting: Gastroenterology

## 2018-12-30 ENCOUNTER — Ambulatory Visit (INDEPENDENT_AMBULATORY_CARE_PROVIDER_SITE_OTHER): Payer: Medicare Other | Admitting: Gastroenterology

## 2018-12-30 VITALS — BP 142/64 | HR 84 | Temp 98.4°F | Ht 65.0 in | Wt 259.0 lb

## 2018-12-30 DIAGNOSIS — D696 Thrombocytopenia, unspecified: Secondary | ICD-10-CM

## 2018-12-30 DIAGNOSIS — I8500 Esophageal varices without bleeding: Secondary | ICD-10-CM

## 2018-12-30 DIAGNOSIS — K7469 Other cirrhosis of liver: Secondary | ICD-10-CM | POA: Diagnosis not present

## 2018-12-30 MED ORDER — NADOLOL 40 MG PO TABS: 40.0000 mg | ORAL_TABLET | Freq: Every day | ORAL | 3 refills | Status: DC

## 2018-12-30 NOTE — Patient Instructions (Addendum)
Continue to avoid eating any salt.  We can start a fluid-pill when you are ready.  I have recommended a trial of a medication at night to try to decrease the size of the blood vessels in your esophagus. I recommend taking it at bedtime.   An ultrasound is due in February to screen for liver cancer.  Let's plan to follow-up after your ultrasound. Please call with any questions or concerns in the meantime.    Low-Sodium Eating Plan Sodium, which is an element that makes up salt, helps you maintain a healthy balance of fluids in your body. Too much sodium can increase your blood pressure and cause fluid and waste to be held in your body. Your health care provider or dietitian may recommend following this plan if you have high blood pressure (hypertension), kidney disease, liver disease, or heart failure. Eating less sodium can help lower your blood pressure, reduce swelling, and protect your heart, liver, and kidneys. What are tips for following this plan? General guidelines  Most people on this plan should limit their sodium intake to 1,500-2,000 mg (milligrams) of sodium each day. Reading food labels   The Nutrition Facts label lists the amount of sodium in one serving of the food. If you eat more than one serving, you must multiply the listed amount of sodium by the number of servings.  Choose foods with less than 140 mg of sodium per serving.  Avoid foods with 300 mg of sodium or more per serving. Shopping  Look for lower-sodium products, often labeled as "low-sodium" or "no salt added."  Always check the sodium content even if foods are labeled as "unsalted" or "no salt added".  Buy fresh foods. ? Avoid canned foods and premade or frozen meals. ? Avoid canned, cured, or processed meats  Buy breads that have less than 80 mg of sodium per slice. Cooking  Eat more home-cooked food and less restaurant, buffet, and fast food.  Avoid adding salt when cooking. Use salt-free  seasonings or herbs instead of table salt or sea salt. Check with your health care provider or pharmacist before using salt substitutes.  Cook with plant-based oils, such as canola, sunflower, or olive oil. Meal planning  When eating at a restaurant, ask that your food be prepared with less salt or no salt, if possible.  Avoid foods that contain MSG (monosodium glutamate). MSG is sometimes added to [place] food, bouillon, and some canned foods. What foods are recommended? The items listed may not be a complete list. Talk with your dietitian about what dietary choices are best for you. Grains Low-sodium cereals, including oats, puffed wheat and rice, and shredded wheat. Low-sodium crackers. Unsalted rice. Unsalted pasta. Low-sodium bread. Whole-grain breads and whole-grain pasta. Vegetables Fresh or frozen vegetables. "No salt added" canned vegetables. "No salt added" tomato sauce and paste. Low-sodium or reduced-sodium tomato and vegetable juice. Fruits Fresh, frozen, or canned fruit. Fruit juice. Meats and other protein foods Fresh or frozen (no salt added) meat, poultry, seafood, and fish. Low-sodium canned tuna and salmon. Unsalted nuts. Dried peas, beans, and lentils without added salt. Unsalted canned beans. Eggs. Unsalted nut butters. Dairy Milk. Soy milk. Cheese that is naturally low in sodium, such as ricotta cheese, fresh mozzarella, or Swiss cheese Low-sodium or reduced-sodium cheese. Cream cheese. Yogurt. Fats and oils Unsalted butter. Unsalted margarine with no trans fat. Vegetable oils such as canola or olive oils. Seasonings and other foods Fresh and dried herbs and spices. Salt-free seasonings. Low-sodium mustard and  ketchup. Sodium-free salad dressing. Sodium-free light mayonnaise. Fresh or refrigerated horseradish. Lemon juice. Vinegar. Homemade, reduced-sodium, or low-sodium soups. Unsalted popcorn and pretzels. Low-salt or salt-free chips. What foods are not  recommended? The items listed may not be a complete list. Talk with your dietitian about what dietary choices are best for you. Grains Instant hot cereals. Bread stuffing, pancake, and biscuit mixes. Croutons. Seasoned rice or pasta mixes. Noodle soup cups. Boxed or frozen macaroni and cheese. Regular salted crackers. Self-rising flour. Vegetables Sauerkraut, pickled vegetables, and relishes. Olives. [place] fries. Onion rings. Regular canned vegetables (not low-sodium or reduced-sodium). Regular canned tomato sauce and paste (not low-sodium or reduced-sodium). Regular tomato and vegetable juice (not low-sodium or reduced-sodium). Frozen vegetables in sauces. Meats and other protein foods Meat or fish that is salted, canned, smoked, spiced, or pickled. Bacon, ham, sausage, hotdogs, corned beef, chipped beef, packaged lunch meats, salt pork, jerky, pickled herring, anchovies, regular canned tuna, sardines, salted nuts. Dairy Processed cheese and cheese spreads. Cheese curds. Blue cheese. Feta cheese. String cheese. Regular cottage cheese. Buttermilk. Canned milk. Fats and oils Salted butter. Regular margarine. Ghee. Bacon fat. Seasonings and other foods Onion salt, garlic salt, seasoned salt, table salt, and sea salt. Canned and packaged gravies. Worcestershire sauce. Tartar sauce. Barbecue sauce. Teriyaki sauce. Soy sauce, including reduced-sodium. Steak sauce. Fish sauce. Oyster sauce. Cocktail sauce. Horseradish that you find on the shelf. Regular ketchup and mustard. Meat flavorings and tenderizers. Bouillon cubes. Hot sauce and Tabasco sauce. Premade or packaged marinades. Premade or packaged taco seasonings. Relishes. Regular salad dressings. Salsa. Potato and tortilla chips. Corn chips and puffs. Salted popcorn and pretzels. Canned or dried soups. Pizza. Frozen entrees and pot pies. Summary  Eating less sodium can help lower your blood pressure, reduce swelling, and protect your heart, liver,  and kidneys.  Most people on this plan should limit their sodium intake to 1,500-2,000 mg (milligrams) of sodium each day.  Canned, boxed, and frozen foods are high in sodium. Restaurant foods, fast foods, and pizza are also very high in sodium. You also get sodium by adding salt to food.  Try to cook at home, eat more fresh fruits and vegetables, and eat less fast food, canned, processed, or prepared foods. This information is not intended to replace advice given to you by your health care provider. Make sure you discuss any questions you have with your health care provider. Document Released: 08/30/2001 Document Revised: 02/20/2017 Document Reviewed: 03/03/2016 Elsevier Patient Education  2020 [employer].

## 2018-12-30 NOTE — Progress Notes (Signed)
Referring Provider: Prudencio Burly, MD Primary Care Physician:  Prudencio Burly, MD   Chief complaint:  Abnormal ultrasound   IMPRESSION:  Cirrhosis by imaging with associated portal hypertension/thrombocytopenia    - MELD 10    - likely due to prior alcohol +/- fatty liver    - Negative labs for HBV, HCV, HIV, negative iron profile and ferritin, negative ANA    - completed vaccination for Twinrix    - EGD 2016 showed portal gastropathy with no esophageal or gastric varices ([place])    - No history of decompensation    - not interested in liver transplantation Profound thrombocytopenia Lower extremity edema with ascites seen on ultrasound earlier this year, not on recent MRI Intermittent RUQ pain that may be related to capsular stretch Obesity with BMI 40 History of heavy alcohol use    - drinking a pint daily starting at age 53    - no alcohol in over 20 years Colonoscopy in her 33s at [facility]    - Dr. 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  Will alternate ultrasound with MRI.  Reviewed need for 2000 mg sodium restricted diet.  Patient declines trial of Aldactone.  Declines referral to nutritionist to better control dietary compliance.  I have again reminded her to avoid all NSAIDs to minimize renal dysfunction.  Profound thrombocytopenia limits her candidacy for endoscopic evaluation.  As small esophageal varices were seen on recent MRI we will start low-dose nonselective beta-blocker therapy and titrate as her heart rate will allow.   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 Janese Banks for an abnormal ultrasound. I met the patient 03/22/18.  The interval history is obtained to the patient and review of her electronic health record.  Occasional spasm that she attributes to her liver. Rare nausea.  Did not start the aldactone out of fear over her kidneys.  Continues to watch her sodium. Feels that she is extremely compliant with a low sodium diet.   Last living member of her family. No friends. Won't get an animal because she doesn't want to leave it stranded.  Is very lonely and isolated.  She has lower extremity edema. Declines diuretics because she doesn't want to urinate any more. She does not feel this is a problem because she does not have edema elsewhere.   She declines flu vaccine. She is not drinking any alcohol.   Labs from 04/23/2018: Na 140, K 3.8, BUN 8, crt 0.68, TB 2.2, DB 0.8, AST 70, ALT 50, alk phos 139, WBC 3.7, hgb 13.2, platelets 35 Labs 07/16/18: Na 138, crt 0.66, TB 1.7, DB 0.7, AST 65, ALT 42, alk phos 142, platelets 30, INR 1.2 for a calculated MELD 10  Abdominal imaging Abdominal ultrasound 05/05/17:  Stable cirrhosis Abdominal ultrasound 11/25/17: cirrhosis, normal flow of the portal vein, gallbladder wall thickening Abdominal ultrasound 05/26/18: cirrhosis, new ascites MRI 12/01/18: cirrhosis, splenomegaly, patent portal vein,  small gastric varices, no ascites, no HCC  Endoscopic history: Upper endoscopy in 2016 ([place]) showed portal gastropathy but no esophageal or gastric varices.  He was recommending a colonoscopy in 2017.  Past Medical History:  Diagnosis Date  . Anxiety   . Arthritis   . Blood transfusion without reported diagnosis   . Depression   . HTN (hypertension)   .  Thrombocytopenia ([place])     Past Surgical History:  Procedure Laterality Date  . ANKLE SURGERY Left   . KNEE ARTHROSCOPY Left   . OVARY SURGERY Right    due to cyst or tumor  . PITUITARY SURGERY     tumor removed  . thumb SURGERY    . TONSILLECTOMY    . WRIST SURGERY Left     Current Outpatient Medications  Medication Sig Dispense Refill  . acetaminophen (TYLENOL) 325 MG tablet Take 650 mg by mouth every 6 (six) hours as needed.    Marland Kitchen albuterol (PROVENTIL HFA;VENTOLIN HFA) 108 (90 Base) MCG/ACT inhaler Inhale 2 puffs into the lungs every 6 (six) hours as needed. Reported on 08/07/2015    . amLODipine (NORVASC) 10 MG tablet Take 10 mg by mouth daily.     . celecoxib (CELEBREX) 100 MG capsule Take 100 mg by mouth daily.    . clonazePAM (KLONOPIN) 0.5 MG tablet Take 1 tablet by mouth.    . desmopressin (DDAVP) 0.01 % SOLN Place 1 spray into the nose 2 (two) times daily.     . sertraline (ZOLOFT) 100 MG tablet Take 200 mg by mouth daily.     . traZODone (DESYREL) 100 MG tablet Take 200 mg by mouth at bedtime.      No current facility-administered medications for this visit.     Allergies as of 12/30/2018 - Review Complete 12/20/2018  Allergen Reaction Noted  . Plasma, human Swelling 08/07/2015    Family History  Problem Relation Age of Onset  . Pancreatic cancer Father   . Breast cancer Maternal Grandmother     Social History   Socioeconomic History  . Marital status: Single    Spouse name: Not on file  . Number of children: 0  . Years of education: Not on file  . Highest education level: Not on file  Occupational History  . Occupation: retired  Scientific laboratory technician  . Financial resource strain: Not on file  . Food insecurity    Worry: Not on file    Inability: Not on file  . Transportation needs    Medical: Not on file    Non-medical: Not on file  Tobacco Use  . Smoking status: Never Smoker  . Smokeless tobacco: Current User    Types: Snuff  Substance and Sexual  Activity  . Alcohol use: Not Currently    Alcohol/week: 0.0 standard drinks  . Drug use: Yes    Types: Marijuana  . Sexual activity: Not on file  Lifestyle  . Physical activity    Days per week: Not on file    Minutes per session: Not on file  . Stress: Not on file  Relationships  . Social Herbalist on phone: Not on file    Gets together: Not on file    Attends religious service: Not on file    Active member of club or organization: Not on file    Attends meetings of clubs or organizations: Not on file    Relationship status: Not on file  . Intimate partner violence  Fear of current or ex partner: Not on file    Emotionally abused: Not on file    Physically abused: Not on file    Forced sexual activity: Not on file  Other Topics Concern  . Not on file  Social History Narrative  . Not on file    Physical Exam: General:   Alert, in NAD. No scleral icterus. No bilateral temporal wasting.  Heart:  Regular rate and rhythm; no murmurs Pulm: Clear anteriorly; no wheezing Abdomen:  Soft. Nontender. Nondistended. Normal bowel sounds. No rebound or guarding. No fluid wave.  LAD: No inguinal or umbilical LAD Extremities:  Without edema. Neurologic:  Alert and  oriented x4;  grossly normal neurologically; no asterixis or clonus. Skin: No jaundice. No palmar erythema or spider angioma.  Psych:  Alert and cooperative. Normal mood and affect.   Adaeze Better L. Tarri Glenn, MD, MPH [facility] 12/30/2018, 3:09 PM

## 2019-01-14 ENCOUNTER — Other Ambulatory Visit: Payer: Medicare Other

## 2019-01-14 ENCOUNTER — Ambulatory Visit: Payer: Medicare Other | Admitting: Oncology

## 2019-01-31 ENCOUNTER — Inpatient Hospital Stay: Payer: Medicare Other | Attending: Oncology

## 2019-01-31 ENCOUNTER — Other Ambulatory Visit: Payer: Self-pay

## 2019-01-31 DIAGNOSIS — D696 Thrombocytopenia, unspecified: Secondary | ICD-10-CM | POA: Diagnosis not present

## 2019-01-31 LAB — CBC WITH DIFFERENTIAL/PLATELET
Abs Immature Granulocytes: 0.01 10*3/uL (ref 0.00–0.07)
Basophils Absolute: 0 10*3/uL (ref 0.0–0.1)
Basophils Relative: 1 %
Eosinophils Absolute: 0.1 10*3/uL (ref 0.0–0.5)
Eosinophils Relative: 2 %
HCT: 40.7 % (ref 36.0–46.0)
Hemoglobin: 13.4 g/dL (ref 12.0–15.0)
Immature Granulocytes: 0 %
Lymphocytes Relative: 26 %
Lymphs Abs: 0.8 10*3/uL (ref 0.7–4.0)
MCH: 29.1 pg (ref 26.0–34.0)
MCHC: 32.9 g/dL (ref 30.0–36.0)
MCV: 88.5 fL (ref 80.0–100.0)
Monocytes Absolute: 0.2 10*3/uL (ref 0.1–1.0)
Monocytes Relative: 8 %
Neutro Abs: 1.9 10*3/uL (ref 1.7–7.7)
Neutrophils Relative %: 63 %
Platelets: 25 10*3/uL — CL (ref 150–400)
RBC: 4.6 MIL/uL (ref 3.87–5.11)
RDW: 15.4 % (ref 11.5–15.5)
WBC: 3 10*3/uL — ABNORMAL LOW (ref 4.0–10.5)
nRBC: 0 % (ref 0.0–0.2)

## 2019-03-16 ENCOUNTER — Other Ambulatory Visit: Payer: Self-pay

## 2019-03-21 ENCOUNTER — Inpatient Hospital Stay (HOSPITAL_BASED_OUTPATIENT_CLINIC_OR_DEPARTMENT_OTHER): Payer: Medicare Other | Admitting: Oncology

## 2019-03-21 ENCOUNTER — Encounter: Payer: Self-pay | Admitting: Oncology

## 2019-03-21 ENCOUNTER — Inpatient Hospital Stay: Payer: Medicare Other | Attending: Oncology

## 2019-03-21 ENCOUNTER — Other Ambulatory Visit: Payer: Self-pay

## 2019-03-21 VITALS — BP 135/65 | HR 77 | Temp 96.4°F | Ht 65.0 in | Wt 260.0 lb

## 2019-03-21 DIAGNOSIS — I272 Pulmonary hypertension, unspecified: Secondary | ICD-10-CM | POA: Diagnosis not present

## 2019-03-21 DIAGNOSIS — D696 Thrombocytopenia, unspecified: Secondary | ICD-10-CM | POA: Diagnosis present

## 2019-03-21 DIAGNOSIS — D6862 Lupus anticoagulant syndrome: Secondary | ICD-10-CM | POA: Diagnosis not present

## 2019-03-21 DIAGNOSIS — K5900 Constipation, unspecified: Secondary | ICD-10-CM | POA: Insufficient documentation

## 2019-03-21 DIAGNOSIS — E669 Obesity, unspecified: Secondary | ICD-10-CM | POA: Insufficient documentation

## 2019-03-21 DIAGNOSIS — K7460 Unspecified cirrhosis of liver: Secondary | ICD-10-CM | POA: Diagnosis not present

## 2019-03-21 LAB — CBC WITH DIFFERENTIAL/PLATELET
Abs Immature Granulocytes: 0.01 K/uL (ref 0.00–0.07)
Basophils Absolute: 0 K/uL (ref 0.0–0.1)
Basophils Relative: 1 %
Eosinophils Absolute: 0.1 K/uL (ref 0.0–0.5)
Eosinophils Relative: 2 %
HCT: 44.8 % (ref 36.0–46.0)
Hemoglobin: 14.3 g/dL (ref 12.0–15.0)
Immature Granulocytes: 0 %
Lymphocytes Relative: 27 %
Lymphs Abs: 1.1 K/uL (ref 0.7–4.0)
MCH: 28.4 pg (ref 26.0–34.0)
MCHC: 31.9 g/dL (ref 30.0–36.0)
MCV: 88.9 fL (ref 80.0–100.0)
Monocytes Absolute: 0.3 K/uL (ref 0.1–1.0)
Monocytes Relative: 7 %
Neutro Abs: 2.6 K/uL (ref 1.7–7.7)
Neutrophils Relative %: 63 %
Platelets: 29 K/uL — CL (ref 150–400)
RBC: 5.04 MIL/uL (ref 3.87–5.11)
RDW: 15.2 % (ref 11.5–15.5)
WBC: 4.1 K/uL (ref 4.0–10.5)
nRBC: 0 % (ref 0.0–0.2)

## 2019-03-21 LAB — COMPREHENSIVE METABOLIC PANEL WITH GFR
ALT: 51 U/L — ABNORMAL HIGH (ref 0–44)
AST: 64 U/L — ABNORMAL HIGH (ref 15–41)
Albumin: 3.8 g/dL (ref 3.5–5.0)
Alkaline Phosphatase: 115 U/L (ref 38–126)
Anion gap: 9 (ref 5–15)
BUN: 13 mg/dL (ref 6–20)
CO2: 28 mmol/L (ref 22–32)
Calcium: 9.2 mg/dL (ref 8.9–10.3)
Chloride: 104 mmol/L (ref 98–111)
Creatinine, Ser: 0.91 mg/dL (ref 0.44–1.00)
GFR calc Af Amer: >60 mL/min
GFR calc non Af Amer: >60 mL/min
Glucose, Bld: 129 mg/dL — ABNORMAL HIGH (ref 70–99)
Potassium: 4.5 mmol/L (ref 3.5–5.1)
Sodium: 141 mmol/L (ref 135–145)
Total Bilirubin: 2.6 mg/dL — ABNORMAL HIGH (ref 0.3–1.2)
Total Protein: 7.3 g/dL (ref 6.5–8.1)

## 2019-03-21 NOTE — Progress Notes (Signed)
Patient stated that she always have back pain. Patient also stated that she has had nausea and vomiting at times. Patient denied fever, chills or constipation.

## 2019-03-22 NOTE — Progress Notes (Signed)
Hematology/Oncology Consult note [facility]  Telephone:([phone] [phone]  Patient Care Team: Prudencio Burly, MD as PCP - General (Family Medicine) Arta Silence, MD as Consulting Physician (Gastroenterology)   Name of the patient: Jessica Cline  [id number]  14-Sep-1965   Date of visit: 03/22/19  Diagnosis- thrombocytopenia likely secondary to cirrhosis of the liver  Chief complaint/ Reason for visit-routine follow-up of thrombocytopenia  Heme/Onc history:  Patient is a 53 year old female with a history of chronic liver disease and cirrhosis likely secondary to [name]. She is seen in [place] for this. 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 She had previously seen Dr. Mike Gip and transferring her care to me. She has a history of positive lupus anticoagulant but no history of thrombosis. 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 Hepatitis B C and HIV testing was negative. She had also presented with vaginal hemorrhage back in 2016. She required PRBC and platelet transfusion at that time and developed an allergic reaction to platelets. She will subsequently require premedications with Benadryl and steroids. Von Willebrand panel testing was negative.   Interval history-no acute issues with her health since her last visit.  Denies any bleeding or bruising.  ECOG PS- 1 Pain scale- 0   Review of systems- Review of Systems  Constitutional: Positive for malaise/fatigue. Negative for chills, fever and weight loss.  HENT: Negative for congestion, ear discharge and nosebleeds.   Eyes: Negative for blurred vision.  Respiratory: Negative for cough, hemoptysis, sputum production, shortness of breath and wheezing.   Cardiovascular: Negative for chest pain, palpitations, orthopnea and claudication.  Gastrointestinal: Negative for abdominal pain, blood in stool, constipation, diarrhea, heartburn, melena, nausea and vomiting.   Genitourinary: Negative for dysuria, flank pain, frequency, hematuria and urgency.  Musculoskeletal: Negative for back pain, joint pain and myalgias.  Skin: Negative for rash.  Neurological: Negative for dizziness, tingling, focal weakness, seizures, weakness and headaches.  Endo/Heme/Allergies: Does not bruise/bleed easily.  Psychiatric/Behavioral: Negative for depression and suicidal ideas. The patient does not have insomnia.       Allergies  Allergen Reactions  . Plasma, Human Swelling     Past Medical History:  Diagnosis Date  . Anxiety   . Arthritis   . Blood transfusion without reported diagnosis   . Depression   . HTN (hypertension)   . Thrombocytopenia ([place])      Past Surgical History:  Procedure Laterality Date  . ANKLE SURGERY Left   . KNEE ARTHROSCOPY Left   . OVARY SURGERY Right    due to cyst or tumor  . PITUITARY SURGERY     tumor removed  . thumb SURGERY    . TONSILLECTOMY    . WRIST SURGERY Left     Social History   Socioeconomic History  . Marital status: Single    Spouse name: Not on file  . Number of children: 0  . Years of education: Not on file  . Highest education level: Not on file  Occupational History  . Occupation: retired  Tobacco Use  . Smoking status: Never Smoker  . Smokeless tobacco: Current User    Types: Snuff  Substance and Sexual Activity  . Alcohol use: Not Currently    Alcohol/week: 0.0 standard drinks  . Drug use: Yes    Types: Marijuana  . Sexual activity: Not on file  Other Topics Concern  . Not on file  Social History Narrative  . Not on file   Social Determinants of Health   Financial  Resource Strain:   . Difficulty of Paying Living Expenses: Not on file  Food Insecurity:   . Worried About Charity fundraiser in the Last Year: Not on file  . Ran Out of Food in the Last Year: Not on file  Transportation Needs:   . Lack of Transportation (Medical): Not on file  . Lack of Transportation (Non-Medical): Not  on file  Physical Activity:   . Days of Exercise per Week: Not on file  . Minutes of Exercise per Session: Not on file  Stress:   . Feeling of Stress : Not on file  Social Connections:   . Frequency of Communication with Friends and Family: Not on file  . Frequency of Social Gatherings with Friends and Family: Not on file  . Attends Religious Services: Not on file  . Active Member of Clubs or Organizations: Not on file  . Attends Archivist Meetings: Not on file  . Marital Status: Not on file  Intimate Partner Violence:   . Fear of Current or Ex-Partner: Not on file  . Emotionally Abused: Not on file  . Physically Abused: Not on file  . Sexually Abused: Not on file    Family History  Problem Relation Age of Onset  . Pancreatic cancer Father   . Breast cancer Maternal Grandmother      Current Outpatient Medications:  .  amLODipine (NORVASC) 10 MG tablet, Take 10 mg by mouth daily. , Disp: , Rfl:  .  celecoxib (CELEBREX) 100 MG capsule, Take 100 mg by mouth daily., Disp: , Rfl:  .  clonazePAM (KLONOPIN) 0.5 MG tablet, Take 1 tablet by mouth., Disp: , Rfl:  .  desmopressin (DDAVP) 0.01 % SOLN, Place 1 spray into the nose 2 (two) times daily. , Disp: , Rfl:  .  nadolol (CORGARD) 40 MG tablet, Take 1 tablet (40 mg total) by mouth at bedtime., Disp: 30 tablet, Rfl: 3 .  sertraline (ZOLOFT) 100 MG tablet, Take 200 mg by mouth daily. , Disp: , Rfl:  .  traZODone (DESYREL) 100 MG tablet, Take 200 mg by mouth at bedtime. , Disp: , Rfl:  .  albuterol (PROVENTIL HFA;VENTOLIN HFA) 108 (90 Base) MCG/ACT inhaler, Inhale 2 puffs into the lungs every 6 (six) hours as needed. Reported on 08/07/2015, Disp: , Rfl:   Physical exam:  Vitals:   03/21/19 1007  BP: 135/65  Pulse: 77  Temp: (!) 96.4 F (35.8 C)  TempSrc: Tympanic  Weight: 260 lb (117.9 kg)  Height: '5\' 5"'$  (1.651 m)   Physical Exam Constitutional:      Appearance: She is obese.  HENT:     Head: Normocephalic and  atraumatic.  Eyes:     Pupils: Pupils are equal, round, and reactive to light.  Cardiovascular:     Rate and Rhythm: Normal rate and regular rhythm.     Heart sounds: Normal heart sounds.  Pulmonary:     Effort: Pulmonary effort is normal.     Breath sounds: Normal breath sounds.  Abdominal:     General: Bowel sounds are normal.     Palpations: Abdomen is soft.     Comments: Spleen tip palpable  Musculoskeletal:     Cervical back: Normal range of motion.  Skin:    General: Skin is warm and dry.  Neurological:     Mental Status: She is alert and oriented to person, place, and time.      CMP Latest Ref Rng & Units 03/21/2019  Glucose 70 - 99 mg/dL 129(H)  BUN 6 - 20 mg/dL 13  Creatinine 0.44 - 1.00 mg/dL 0.91  Sodium 135 - 145 mmol/L 141  Potassium 3.5 - 5.1 mmol/L 4.5  Chloride 98 - 111 mmol/L 104  CO2 22 - 32 mmol/L 28  Calcium 8.9 - 10.3 mg/dL 9.2  Total Protein 6.5 - 8.1 g/dL 7.3  Total Bilirubin 0.3 - 1.2 mg/dL 2.6(H)  Alkaline Phos 38 - 126 U/L 115  AST 15 - 41 U/L 64(H)  ALT 0 - 44 U/L 51(H)   CBC Latest Ref Rng & Units 03/21/2019  WBC 4.0 - 10.5 K/uL 4.1  Hemoglobin 12.0 - 15.0 g/dL 14.3  Hematocrit 36.0 - 46.0 % 44.8  Platelets 150 - 400 K/uL 29(LL)      Assessment and plan- Patient is a 53 y.o. female with thrombocytopenia likely secondary to cirrhosis and portal hypertension.  This is a routine follow-up visit  Patient's platelet counts have been between 40s to 50s up until July 2019 after which she slowly drifted down to the 30s.  Presently her platelet counts have been between 25-38 over the last 4 months.  She has not had any bleeding or bruising.  She wishes to defer any treatment including steroids at this time  We will therefore continue monitoring her platelet count closely.  Repeat CBC with differential in 2 months in 4 months and I will see her back in 4 months.  If platelet counts are less than 20 I will first give her an empiric trial of Decadron  40 mg for 4 days.  If it does not respond I will consider doing a bone marrow biopsy.  However I suspect her thrombocytopenia is all secondary to chronic liver disease.   Visit Diagnosis 1. Thrombocytopenia ([place])      Dr. Randa Evens, MD, MPH [facility] at [facility] [phone] 03/22/2019 8:31 AM

## 2019-05-23 ENCOUNTER — Other Ambulatory Visit: Payer: Self-pay

## 2019-05-23 ENCOUNTER — Inpatient Hospital Stay: Payer: Medicare Other | Attending: Oncology

## 2019-05-23 DIAGNOSIS — D696 Thrombocytopenia, unspecified: Secondary | ICD-10-CM | POA: Insufficient documentation

## 2019-05-23 LAB — CBC WITH DIFFERENTIAL/PLATELET
Abs Immature Granulocytes: 0.01 10*3/uL (ref 0.00–0.07)
Basophils Absolute: 0 10*3/uL (ref 0.0–0.1)
Basophils Relative: 1 %
Eosinophils Absolute: 0.1 10*3/uL (ref 0.0–0.5)
Eosinophils Relative: 2 %
HCT: 42.6 % (ref 36.0–46.0)
Hemoglobin: 13.7 g/dL (ref 12.0–15.0)
Immature Granulocytes: 0 %
Lymphocytes Relative: 21 %
Lymphs Abs: 0.8 10*3/uL (ref 0.7–4.0)
MCH: 28.7 pg (ref 26.0–34.0)
MCHC: 32.2 g/dL (ref 30.0–36.0)
MCV: 89.3 fL (ref 80.0–100.0)
Monocytes Absolute: 0.3 10*3/uL (ref 0.1–1.0)
Monocytes Relative: 9 %
Neutro Abs: 2.5 10*3/uL (ref 1.7–7.7)
Neutrophils Relative %: 67 %
Platelets: 25 10*3/uL — CL (ref 150–400)
RBC: 4.77 MIL/uL (ref 3.87–5.11)
RDW: 15.4 % (ref 11.5–15.5)
WBC: 3.7 10*3/uL — ABNORMAL LOW (ref 4.0–10.5)
nRBC: 0 % (ref 0.0–0.2)

## 2019-07-21 ENCOUNTER — Inpatient Hospital Stay (HOSPITAL_BASED_OUTPATIENT_CLINIC_OR_DEPARTMENT_OTHER): Payer: Medicare Other | Admitting: Oncology

## 2019-07-21 ENCOUNTER — Other Ambulatory Visit: Payer: Self-pay

## 2019-07-21 ENCOUNTER — Encounter: Payer: Self-pay | Admitting: Oncology

## 2019-07-21 ENCOUNTER — Inpatient Hospital Stay: Payer: Medicare Other | Attending: Oncology

## 2019-07-21 DIAGNOSIS — D696 Thrombocytopenia, unspecified: Secondary | ICD-10-CM

## 2019-07-21 DIAGNOSIS — K7030 Alcoholic cirrhosis of liver without ascites: Secondary | ICD-10-CM

## 2019-07-21 DIAGNOSIS — K766 Portal hypertension: Secondary | ICD-10-CM | POA: Insufficient documentation

## 2019-07-21 LAB — CBC WITH DIFFERENTIAL/PLATELET
Abs Immature Granulocytes: 0.01 10*3/uL (ref 0.00–0.07)
Basophils Absolute: 0 10*3/uL (ref 0.0–0.1)
Basophils Relative: 0 %
Eosinophils Absolute: 0.1 10*3/uL (ref 0.0–0.5)
Eosinophils Relative: 2 %
HCT: 41 % (ref 36.0–46.0)
Hemoglobin: 13.4 g/dL (ref 12.0–15.0)
Immature Granulocytes: 0 %
Lymphocytes Relative: 23 %
Lymphs Abs: 0.7 10*3/uL (ref 0.7–4.0)
MCH: 29.1 pg (ref 26.0–34.0)
MCHC: 32.7 g/dL (ref 30.0–36.0)
MCV: 89.1 fL (ref 80.0–100.0)
Monocytes Absolute: 0.3 10*3/uL (ref 0.1–1.0)
Monocytes Relative: 9 %
Neutro Abs: 2.1 10*3/uL (ref 1.7–7.7)
Neutrophils Relative %: 66 %
Platelets: 24 10*3/uL — CL (ref 150–400)
RBC: 4.6 MIL/uL (ref 3.87–5.11)
RDW: 15.9 % — ABNORMAL HIGH (ref 11.5–15.5)
WBC: 3.2 10*3/uL — ABNORMAL LOW (ref 4.0–10.5)
nRBC: 0 % (ref 0.0–0.2)

## 2019-07-21 MED ORDER — DEXAMETHASONE 4 MG PO TABS: 40.0000 mg | ORAL_TABLET | Freq: Every day | ORAL | 0 refills | Status: DC

## 2019-07-21 NOTE — Progress Notes (Signed)
No problems with bleeding. 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

## 2019-07-25 NOTE — Progress Notes (Signed)
Hematology/Oncology Consult note [facility]  [phone] [phone]  Patient Care Team: Sherilyn Banker, MD as PCP - General (Family Medicine) Willis Modena, MD as Consulting Physician (Gastroenterology) Creig Hines, MD as Consulting Physician (Hematology and Oncology)   Name of the patient: Jessica Cline  [id number]  [birth year]   Date of visit: 07/25/19  Diagnosis- thrombocytopenia likely secondary to cirrhosis of the liver  Chief complaint/ Reason for visit-routine follow-up of thrombocytopenia  Heme/Onc history: Patient is a 54 year old female with a history of chronic liver disease and cirrhosis likely secondary to [place]. She is seen in [place] [facility] for this. 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 She had previously seen Dr. Merlene Pulling and transferring her care to me. She has a history of positive lupus anticoagulant but no history of thrombosis. 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 Hepatitis B C and HIV testing was negative. She had also presented with vaginal hemorrhage back in 2016. She required PRBC and platelet transfusion at that time and developed an allergic reaction to platelets. She will subsequently require premedications with Benadryl and steroids. Von Willebrand panel testing was negative.   Interval history-reports that she had a fall About a week ago.  She did not hit her head and does not report any aches and pains anywhere from the fall  ECOG PS- 1 Pain scale- 0   Review of systems- Review of Systems  Constitutional: Negative for chills, fever, malaise/fatigue and weight loss.  HENT: Negative for congestion, ear discharge and nosebleeds.   Eyes: Negative for blurred vision.  Respiratory: Negative for cough, hemoptysis, sputum production, shortness of breath and wheezing.   Cardiovascular: Negative for chest pain, palpitations, orthopnea and claudication.  Gastrointestinal: Negative  for abdominal pain, blood in stool, constipation, diarrhea, heartburn, melena, nausea and vomiting.  Genitourinary: Negative for dysuria, flank pain, frequency, hematuria and urgency.  Musculoskeletal: Positive for falls. Negative for back pain, joint pain and myalgias.  Skin: Negative for rash.  Neurological: Negative for dizziness, tingling, focal weakness, seizures, weakness and headaches.  Endo/Heme/Allergies: Does not bruise/bleed easily.  Psychiatric/Behavioral: Negative for depression and suicidal ideas. The patient does not have insomnia.        Allergies  Allergen Reactions  . Plasma, Human Swelling     Past Medical History:  Diagnosis Date  . Anxiety   . Arthritis   . Blood transfusion without reported diagnosis   . Depression   . HTN (hypertension)   . Thrombocytopenia ([facility])      Past Surgical History:  Procedure Laterality Date  . ANKLE SURGERY Left   . KNEE ARTHROSCOPY Left   . OVARY SURGERY Right    due to cyst or tumor  . PITUITARY SURGERY     tumor removed  . thumb SURGERY    . TONSILLECTOMY    . WRIST SURGERY Left     Social History   Socioeconomic History  . Marital status: Single    Spouse name: Not on file  . Number of children: 0  . Years of education: Not on file  . Highest education level: Not on file  Occupational History  . Occupation: retired  Tobacco Use  . Smoking status: Never Smoker  . Smokeless tobacco: Current User    Types: Snuff  Substance and Sexual Activity  . Alcohol use: Not Currently    Alcohol/week: 0.0 standard drinks  . Drug use: Yes    Types: Marijuana  . Sexual activity: Not Currently  Other Topics Concern  .  Not on file  Social History Narrative  . Not on file   Social Determinants of Health   Financial Resource Strain:   . Difficulty of Paying Living Expenses:   Food Insecurity:   . Worried About Programme researcher, broadcasting/film/video in the Last Year:   . Barista in the Last Year:   Transportation Needs:   .  Freight forwarder (Medical):   Marland Kitchen Lack of Transportation (Non-Medical):   Physical Activity:   . Days of Exercise per Week:   . Minutes of Exercise per Session:   Stress:   . Feeling of Stress :   Social Connections:   . Frequency of Communication with Friends and Family:   . Frequency of Social Gatherings with Friends and Family:   . Attends Religious Services:   . Active Member of Clubs or Organizations:   . Attends Banker Meetings:   Marland Kitchen Marital Status:   Intimate Partner Violence:   . Fear of Current or Ex-Partner:   . Emotionally Abused:   Marland Kitchen Physically Abused:   . Sexually Abused:     Family History  Problem Relation Age of Onset  . Pancreatic cancer Father   . Breast cancer Maternal Grandmother      Current Outpatient Medications:  .  albuterol (PROVENTIL HFA;VENTOLIN HFA) 108 (90 Base) MCG/ACT inhaler, Inhale 2 puffs into the lungs every 6 (six) hours as needed. Reported on 08/07/2015, Disp: , Rfl:  .  amLODipine (NORVASC) 10 MG tablet, Take 10 mg by mouth daily. , Disp: , Rfl:  .  celecoxib (CELEBREX) 100 MG capsule, Take 100 mg by mouth daily., Disp: , Rfl:  .  desmopressin (DDAVP) 0.01 % SOLN, Place 1 spray into the nose 2 (two) times daily. , Disp: , Rfl:  .  dexamethasone (DECADRON) 4 MG tablet, Take 10 tablets (40 mg total) by mouth daily. starting 5/3 With food in am for 4 days total, Disp: 40 tablet, Rfl: 0 .  sertraline (ZOLOFT) 100 MG tablet, Take 200 mg by mouth daily. , Disp: , Rfl:  .  traZODone (DESYREL) 100 MG tablet, Take 200 mg by mouth at bedtime. , Disp: , Rfl:   Physical exam: There were no vitals filed for this visit. Physical Exam Constitutional:      General: She is not in acute distress. HENT:     Head: Normocephalic and atraumatic.  Cardiovascular:     Rate and Rhythm: Normal rate and regular rhythm.     Heart sounds: Normal heart sounds.  Pulmonary:     Effort: Pulmonary effort is normal.     Breath sounds: Normal breath  sounds.  Abdominal:     General: Bowel sounds are normal.     Palpations: Abdomen is soft.  Skin:    General: Skin is warm and dry.  Neurological:     Mental Status: She is alert and oriented to person, place, and time.      CMP Latest Ref Rng & Units 03/21/2019  Glucose 70 - 99 mg/dL 809(X)  BUN 6 - 20 mg/dL 13  Creatinine 8.33 - 8.25 mg/dL 0.53  Sodium 976 - 734 mmol/L 141  Potassium 3.5 - 5.1 mmol/L 4.5  Chloride 98 - 111 mmol/L 104  CO2 22 - 32 mmol/L 28  Calcium 8.9 - 10.3 mg/dL 9.2  Total Protein 6.5 - 8.1 g/dL 7.3  Total Bilirubin 0.3 - 1.2 mg/dL 2.6(H)  Alkaline Phos 38 - 126 U/L 115  AST 15 -  41 U/L 64(H)  ALT 0 - 44 U/L 51(H)   CBC Latest Ref Rng & Units 07/21/2019  WBC 4.0 - 10.5 K/uL 3.2(L)  Hemoglobin 12.0 - 15.0 g/dL 13.4  Hematocrit 36.0 - 46.0 % 41.0  Platelets 150 - 400 K/uL 24(LL)      Assessment and plan- Patient is a 54 y.o. female with thrombocytopenia likely secondary to cirrhosis and portal hypertension.  She is here for routine follow-up  Patient is chronic thrombocytopenia likely secondary to underlying cirrhosis.  Her hemoglobin has been stable and her white count is fluctuating between 3-4.  Her platelet counts have been chronically low and have been in the 20s since August 2020.  They are 24 today.  Previously my plan was to observe this conservatively and consider treatment if it falls below 20.  However patient had a recent fall and given that she has low platelets I would like to give her a trial of Decadron 40 mg x 4 days.  If this is all secondary to cirrhosis I do not think her platelet counts will improve.  However if there is a component of ITP Decadron can help.  Repeat CBC in 1 week's time and I will see her in 3 weeks  Discussed risks and benefits of Decadron including all but not limited to agitation, increased energy, mood swings and hyperglycemia.  Patient understands and agrees to proceed as planned   Visit Diagnosis 1.  Thrombocytopenia ([place])   2. Alcoholic cirrhosis of liver without ascites ([place])      Dr. Randa Evens, MD, MPH [facility] at [facility] [phone] 07/25/2019 1:07 PM

## 2019-07-29 ENCOUNTER — Inpatient Hospital Stay: Payer: Medicare Other | Attending: Oncology

## 2019-07-29 ENCOUNTER — Other Ambulatory Visit: Payer: Self-pay

## 2019-07-29 DIAGNOSIS — R161 Splenomegaly, not elsewhere classified: Secondary | ICD-10-CM | POA: Insufficient documentation

## 2019-07-29 DIAGNOSIS — M7989 Other specified soft tissue disorders: Secondary | ICD-10-CM | POA: Insufficient documentation

## 2019-07-29 DIAGNOSIS — K7460 Unspecified cirrhosis of liver: Secondary | ICD-10-CM | POA: Diagnosis not present

## 2019-07-29 DIAGNOSIS — D696 Thrombocytopenia, unspecified: Secondary | ICD-10-CM | POA: Insufficient documentation

## 2019-07-29 LAB — CBC WITH DIFFERENTIAL/PLATELET
Abs Immature Granulocytes: 0.08 10*3/uL — ABNORMAL HIGH (ref 0.00–0.07)
Basophils Absolute: 0 10*3/uL (ref 0.0–0.1)
Basophils Relative: 0 %
Eosinophils Absolute: 0 10*3/uL (ref 0.0–0.5)
Eosinophils Relative: 0 %
HCT: 40.1 % (ref 36.0–46.0)
Hemoglobin: 13.6 g/dL (ref 12.0–15.0)
Immature Granulocytes: 1 %
Lymphocytes Relative: 7 %
Lymphs Abs: 0.4 10*3/uL — ABNORMAL LOW (ref 0.7–4.0)
MCH: 29.7 pg (ref 26.0–34.0)
MCHC: 33.9 g/dL (ref 30.0–36.0)
MCV: 87.6 fL (ref 80.0–100.0)
Monocytes Absolute: 0.3 10*3/uL (ref 0.1–1.0)
Monocytes Relative: 4 %
Neutro Abs: 5.9 10*3/uL (ref 1.7–7.7)
Neutrophils Relative %: 88 %
Platelets: 26 10*3/uL — CL (ref 150–400)
RBC: 4.58 MIL/uL (ref 3.87–5.11)
RDW: 16 % — ABNORMAL HIGH (ref 11.5–15.5)
Smear Review: NORMAL
WBC: 6.7 10*3/uL (ref 4.0–10.5)
nRBC: 0 % (ref 0.0–0.2)

## 2019-08-11 ENCOUNTER — Inpatient Hospital Stay: Payer: Medicare Other

## 2019-08-11 ENCOUNTER — Other Ambulatory Visit: Payer: Self-pay

## 2019-08-11 ENCOUNTER — Encounter: Payer: Self-pay | Admitting: Oncology

## 2019-08-11 ENCOUNTER — Inpatient Hospital Stay (HOSPITAL_BASED_OUTPATIENT_CLINIC_OR_DEPARTMENT_OTHER): Payer: Medicare Other | Admitting: Oncology

## 2019-08-11 VITALS — BP 132/67 | HR 88 | Temp 98.1°F | Resp 16 | Ht 65.0 in | Wt 282.0 lb

## 2019-08-11 DIAGNOSIS — D696 Thrombocytopenia, unspecified: Secondary | ICD-10-CM

## 2019-08-11 LAB — CBC WITH DIFFERENTIAL/PLATELET
Abs Immature Granulocytes: 0.02 10*3/uL (ref 0.00–0.07)
Basophils Absolute: 0 10*3/uL (ref 0.0–0.1)
Basophils Relative: 0 %
Eosinophils Absolute: 0.1 10*3/uL (ref 0.0–0.5)
Eosinophils Relative: 1 %
HCT: 41 % (ref 36.0–46.0)
Hemoglobin: 13.5 g/dL (ref 12.0–15.0)
Immature Granulocytes: 0 %
Lymphocytes Relative: 25 %
Lymphs Abs: 1.3 10*3/uL (ref 0.7–4.0)
MCH: 29.5 pg (ref 26.0–34.0)
MCHC: 32.9 g/dL (ref 30.0–36.0)
MCV: 89.7 fL (ref 80.0–100.0)
Monocytes Absolute: 0.4 10*3/uL (ref 0.1–1.0)
Monocytes Relative: 7 %
Neutro Abs: 3.6 10*3/uL (ref 1.7–7.7)
Neutrophils Relative %: 67 %
Platelets: 21 10*3/uL — CL (ref 150–400)
RBC: 4.57 MIL/uL (ref 3.87–5.11)
RDW: 15.7 % — ABNORMAL HIGH (ref 11.5–15.5)
WBC: 5.4 10*3/uL (ref 4.0–10.5)
nRBC: 0 % (ref 0.0–0.2)

## 2019-08-11 MED ORDER — FUROSEMIDE 20 MG PO TABS
20.0000 mg | ORAL_TABLET | Freq: Every day | ORAL | 0 refills | Status: DC
Start: 2019-08-11 — End: 2019-10-06

## 2019-08-11 NOTE — Progress Notes (Signed)
Pt states that the 40 mg of steroid over 4 days made her gain wt.

## 2019-08-12 NOTE — Progress Notes (Signed)
Hematology/Oncology Consult note [facility]  Telephone:([phone] [phone]  Patient Care Team: Thornton Park, MD as PCP - General (Gastroenterology) Arta Silence, MD as Consulting Physician (Gastroenterology) Sindy Guadeloupe, MD as Consulting Physician (Hematology and Oncology)   Name of the patient: Jessica Cline  [id number]  [birth year]   Date of visit: 08/12/19  Diagnosis- thrombocytopenia likely secondary to cirrhosis of the liver   Chief complaint/ Reason for visit-routine follow-up of thrombocytopenia  Heme/Onc history: Patient is a 54 year old female with a history of chronic liver disease and cirrhosis likely secondary to [place]. She is seen in [place] for this. 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 She had previously seen Dr. Mike Gip and transferring her care to me. She has a history of positive lupus anticoagulant but no history of thrombosis. 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 Hepatitis B C and HIV testing was negative. She had also presented with vaginal hemorrhage back in 2016. She required PRBC and platelet transfusion at that time and developed an allergic reaction to platelets. She will subsequently require premedications with Benadryl and steroids. Von Willebrand panel testing was negative.   Interval history-patient completed 4 days of Decadron but reports 20 pound weight gain since then.  She feels bloated and reports bilateral lower extremity swelling.  Denies any significant bleeding or bruising at this time  ECOG PS- 1 Pain scale- 0   Review of systems- Review of Systems  Constitutional: Negative for chills, fever, malaise/fatigue and weight loss.  HENT: Negative for congestion, ear discharge and nosebleeds.   Eyes: Negative for blurred vision.  Respiratory: Negative for cough, hemoptysis, sputum production, shortness of breath and wheezing.   Cardiovascular: Negative for  chest pain, palpitations, orthopnea and claudication.  Gastrointestinal: Negative for abdominal pain, blood in stool, constipation, diarrhea, heartburn, melena, nausea and vomiting.  Genitourinary: Negative for dysuria, flank pain, frequency, hematuria and urgency.  Musculoskeletal: Negative for back pain, joint pain and myalgias.  Skin: Negative for rash.  Neurological: Negative for dizziness, tingling, focal weakness, seizures, weakness and headaches.  Endo/Heme/Allergies: Does not bruise/bleed easily.  Psychiatric/Behavioral: Negative for depression and suicidal ideas. The patient does not have insomnia.        Allergies  Allergen Reactions  . Plasma, Human Swelling     Past Medical History:  Diagnosis Date  . Anxiety   . Arthritis   . Blood transfusion without reported diagnosis   . Depression   . HTN (hypertension)   . Thrombocytopenia ([place])      Past Surgical History:  Procedure Laterality Date  . ANKLE SURGERY Left   . KNEE ARTHROSCOPY Left   . OVARY SURGERY Right    due to cyst or tumor  . PITUITARY SURGERY     tumor removed  . thumb SURGERY    . TONSILLECTOMY    . WRIST SURGERY Left     Social History   Socioeconomic History  . Marital status: Single    Spouse name: Not on file  . Number of children: 0  . Years of education: Not on file  . Highest education level: Not on file  Occupational History  . Occupation: retired  Tobacco Use  . Smoking status: Never Smoker  . Smokeless tobacco: Current User    Types: Snuff  Substance and Sexual Activity  . Alcohol use: Not Currently    Alcohol/week: 0.0 standard drinks  . Drug use: Yes    Types: Marijuana  . Sexual activity: Not Currently  Other  Topics Concern  . Not on file  Social History Narrative  . Not on file   Social Determinants of Health   Financial Resource Strain:   . Difficulty of Paying Living Expenses:   Food Insecurity:   . Worried About Charity fundraiser in the Last Year:   .  Arboriculturist in the Last Year:   Transportation Needs:   . Film/video editor (Medical):   Marland Kitchen Lack of Transportation (Non-Medical):   Physical Activity:   . Days of Exercise per Week:   . Minutes of Exercise per Session:   Stress:   . Feeling of Stress :   Social Connections:   . Frequency of Communication with Friends and Family:   . Frequency of Social Gatherings with Friends and Family:   . Attends Religious Services:   . Active Member of Clubs or Organizations:   . Attends Archivist Meetings:   Marland Kitchen Marital Status:   Intimate Partner Violence:   . Fear of Current or Ex-Partner:   . Emotionally Abused:   Marland Kitchen Physically Abused:   . Sexually Abused:     Family History  Problem Relation Age of Onset  . Pancreatic cancer Father   . Breast cancer Maternal Grandmother      Current Outpatient Medications:  .  albuterol (PROVENTIL HFA;VENTOLIN HFA) 108 (90 Base) MCG/ACT inhaler, Inhale 2 puffs into the lungs every 6 (six) hours as needed. Reported on 08/07/2015, Disp: , Rfl:  .  amLODipine (NORVASC) 10 MG tablet, Take 10 mg by mouth daily. , Disp: , Rfl:  .  celecoxib (CELEBREX) 100 MG capsule, Take 100 mg by mouth daily., Disp: , Rfl:  .  desmopressin (DDAVP) 0.01 % SOLN, Place 1 spray into the nose 2 (two) times daily. , Disp: , Rfl:  .  sertraline (ZOLOFT) 100 MG tablet, Take 200 mg by mouth daily. , Disp: , Rfl:  .  traZODone (DESYREL) 100 MG tablet, Take 200 mg by mouth at bedtime. , Disp: , Rfl:  .  dexamethasone (DECADRON) 4 MG tablet, Take 10 tablets (40 mg total) by mouth daily. starting 5/3 With food in am for 4 days total, Disp: 40 tablet, Rfl: 0 .  furosemide (LASIX) 20 MG tablet, Take 1 tablet (20 mg total) by mouth daily., Disp: 7 tablet, Rfl: 0  Physical exam:  Vitals:   08/11/19 1016  BP: 132/67  Pulse: 88  Resp: 16  Temp: 98.1 F (36.7 C)  TempSrc: Oral  Weight: 282 lb (127.9 kg)  Height: '5\' 5"'$  (1.651 m)   Physical Exam Constitutional:       General: She is not in acute distress. Cardiovascular:     Rate and Rhythm: Normal rate and regular rhythm.     Heart sounds: Normal heart sounds.  Pulmonary:     Effort: Pulmonary effort is normal.     Breath sounds: Normal breath sounds.  Abdominal:     General: Bowel sounds are normal.     Palpations: Abdomen is soft.  Musculoskeletal:     Comments: Bilateral +1 edema  Skin:    General: Skin is warm and dry.  Neurological:     Mental Status: She is alert and oriented to person, place, and time.      CMP Latest Ref Rng & Units 03/21/2019  Glucose 70 - 99 mg/dL 129(H)  BUN 6 - 20 mg/dL 13  Creatinine 0.44 - 1.00 mg/dL 0.91  Sodium 135 - 145 mmol/L 141  Potassium 3.5 - 5.1 mmol/L 4.5  Chloride 98 - 111 mmol/L 104  CO2 22 - 32 mmol/L 28  Calcium 8.9 - 10.3 mg/dL 9.2  Total Protein 6.5 - 8.1 g/dL 7.3  Total Bilirubin 0.3 - 1.2 mg/dL 2.6(H)  Alkaline Phos 38 - 126 U/L 115  AST 15 - 41 U/L 64(H)  ALT 0 - 44 U/L 51(H)   CBC Latest Ref Rng & Units 08/11/2019  WBC 4.0 - 10.5 K/uL 5.4  Hemoglobin 12.0 - 15.0 g/dL 13.5  Hematocrit 36.0 - 46.0 % 41.0  Platelets 150 - 400 K/uL 21(LL)     Assessment and plan- Patient is a 54 y.o. female with thrombocytopenia likely secondary to cirrhosis and portal hypertension.  She is here for routine follow-up  Patient has had chronic thrombocytopenia which has drifted down to the 20s since August 2020.  Her platelet counts are mostly bouncing back and forth between 24-28 but today her platelet counts are lower at 21.  I did give her a trial of 4 days of Decadron to see if there would be a component of ITP that can improve her platelet counts but she has not responded to that at all.  If her platelet counts drift any lower, I will consider doing a bone marrow biopsy to rule out any other etiology of thrombocytopenia although I still suspect her thrombocytopenia is secondary to cirrhosis and splenic sequestration.  Repeat CBC with differential in 1  month 2 months in 3 months and I will see her back in 3 months  Bilateral lower extremity swelling: Likely secondary to fluid retention from steroids.  I have given her Lasix 20 mg for a week    Visit Diagnosis 1. Thrombocytopenia ([place])      Dr. Randa Evens, MD, MPH [facility] [phone] 08/12/2019 8:56 AM

## 2019-09-12 ENCOUNTER — Other Ambulatory Visit: Payer: Self-pay

## 2019-09-12 ENCOUNTER — Inpatient Hospital Stay: Payer: Medicare Other | Attending: Oncology

## 2019-09-12 DIAGNOSIS — D696 Thrombocytopenia, unspecified: Secondary | ICD-10-CM | POA: Insufficient documentation

## 2019-09-12 LAB — CBC WITH DIFFERENTIAL/PLATELET
Abs Immature Granulocytes: 0.02 10*3/uL (ref 0.00–0.07)
Basophils Absolute: 0 10*3/uL (ref 0.0–0.1)
Basophils Relative: 0 %
Eosinophils Absolute: 0.1 10*3/uL (ref 0.0–0.5)
Eosinophils Relative: 1 %
HCT: 40.1 % (ref 36.0–46.0)
Hemoglobin: 13.2 g/dL (ref 12.0–15.0)
Immature Granulocytes: 0 %
Lymphocytes Relative: 18 %
Lymphs Abs: 1 10*3/uL (ref 0.7–4.0)
MCH: 30.2 pg (ref 26.0–34.0)
MCHC: 32.9 g/dL (ref 30.0–36.0)
MCV: 91.8 fL (ref 80.0–100.0)
Monocytes Absolute: 0.6 10*3/uL (ref 0.1–1.0)
Monocytes Relative: 11 %
Neutro Abs: 4.1 10*3/uL (ref 1.7–7.7)
Neutrophils Relative %: 70 %
Platelets: 34 10*3/uL — ABNORMAL LOW (ref 150–400)
RBC: 4.37 MIL/uL (ref 3.87–5.11)
RDW: 15.9 % — ABNORMAL HIGH (ref 11.5–15.5)
WBC: 5.8 10*3/uL (ref 4.0–10.5)
nRBC: 0 % (ref 0.0–0.2)

## 2019-09-27 ENCOUNTER — Other Ambulatory Visit: Payer: Self-pay

## 2019-09-27 ENCOUNTER — Emergency Department (HOSPITAL_COMMUNITY): Payer: Medicare Other

## 2019-09-27 ENCOUNTER — Inpatient Hospital Stay (HOSPITAL_COMMUNITY)
Admission: EM | Admit: 2019-09-27 | Discharge: 2019-10-06 | DRG: 433 | Disposition: A | Discharge status: Home Health | Payer: Medicare Other | Attending: Family Medicine | Admitting: Family Medicine

## 2019-09-27 ENCOUNTER — Encounter (HOSPITAL_COMMUNITY): Payer: Self-pay | Admitting: Emergency Medicine

## 2019-09-27 DIAGNOSIS — Z20822 Contact with and (suspected) exposure to covid-19: Secondary | ICD-10-CM | POA: Diagnosis present

## 2019-09-27 DIAGNOSIS — K56609 Unspecified intestinal obstruction, unspecified as to partial versus complete obstruction: Secondary | ICD-10-CM | POA: Diagnosis not present

## 2019-09-27 DIAGNOSIS — D696 Thrombocytopenia, unspecified: Secondary | ICD-10-CM | POA: Diagnosis not present

## 2019-09-27 DIAGNOSIS — E232 Diabetes insipidus: Secondary | ICD-10-CM | POA: Diagnosis present

## 2019-09-27 DIAGNOSIS — D649 Anemia, unspecified: Secondary | ICD-10-CM | POA: Diagnosis present

## 2019-09-27 DIAGNOSIS — K7031 Alcoholic cirrhosis of liver with ascites: Secondary | ICD-10-CM | POA: Diagnosis not present

## 2019-09-27 DIAGNOSIS — D6862 Lupus anticoagulant syndrome: Secondary | ICD-10-CM | POA: Diagnosis present

## 2019-09-27 DIAGNOSIS — K56600 Partial intestinal obstruction, unspecified as to cause: Secondary | ICD-10-CM | POA: Diagnosis present

## 2019-09-27 DIAGNOSIS — K8020 Calculus of gallbladder without cholecystitis without obstruction: Secondary | ICD-10-CM | POA: Diagnosis present

## 2019-09-27 DIAGNOSIS — F419 Anxiety disorder, unspecified: Secondary | ICD-10-CM | POA: Diagnosis present

## 2019-09-27 DIAGNOSIS — Z79899 Other long term (current) drug therapy: Secondary | ICD-10-CM

## 2019-09-27 DIAGNOSIS — K766 Portal hypertension: Secondary | ICD-10-CM

## 2019-09-27 DIAGNOSIS — K567 Ileus, unspecified: Secondary | ICD-10-CM

## 2019-09-27 DIAGNOSIS — F17220 Nicotine dependence, chewing tobacco, uncomplicated: Secondary | ICD-10-CM | POA: Diagnosis present

## 2019-09-27 DIAGNOSIS — K7460 Unspecified cirrhosis of liver: Secondary | ICD-10-CM

## 2019-09-27 DIAGNOSIS — R188 Other ascites: Secondary | ICD-10-CM

## 2019-09-27 DIAGNOSIS — I8500 Esophageal varices without bleeding: Secondary | ICD-10-CM

## 2019-09-27 DIAGNOSIS — I110 Hypertensive heart disease with heart failure: Secondary | ICD-10-CM | POA: Diagnosis present

## 2019-09-27 DIAGNOSIS — Z91048 Other nonmedicinal substance allergy status: Secondary | ICD-10-CM

## 2019-09-27 DIAGNOSIS — K560 Paralytic ileus: Secondary | ICD-10-CM | POA: Diagnosis present

## 2019-09-27 DIAGNOSIS — R601 Generalized edema: Secondary | ICD-10-CM

## 2019-09-27 DIAGNOSIS — I8510 Secondary esophageal varices without bleeding: Secondary | ICD-10-CM | POA: Diagnosis present

## 2019-09-27 DIAGNOSIS — D6959 Other secondary thrombocytopenia: Secondary | ICD-10-CM | POA: Diagnosis present

## 2019-09-27 DIAGNOSIS — Z6841 Body Mass Index (BMI) 40.0 and over, adult: Secondary | ICD-10-CM

## 2019-09-27 DIAGNOSIS — F329 Major depressive disorder, single episode, unspecified: Secondary | ICD-10-CM | POA: Diagnosis present

## 2019-09-27 DIAGNOSIS — K7581 Nonalcoholic steatohepatitis (NASH): Secondary | ICD-10-CM

## 2019-09-27 LAB — COMPREHENSIVE METABOLIC PANEL
ALT: 47 U/L — ABNORMAL HIGH (ref 0–44)
AST: 88 U/L — ABNORMAL HIGH (ref 15–41)
Albumin: 3.5 g/dL (ref 3.5–5.0)
Alkaline Phosphatase: 166 U/L — ABNORMAL HIGH (ref 38–126)
Anion gap: 12 (ref 5–15)
BUN: 25 mg/dL — ABNORMAL HIGH (ref 6–20)
CO2: 25 mmol/L (ref 22–32)
Calcium: 8.8 mg/dL — ABNORMAL LOW (ref 8.9–10.3)
Chloride: 98 mmol/L (ref 98–111)
Creatinine, Ser: 1.11 mg/dL — ABNORMAL HIGH (ref 0.44–1.00)
GFR calc Af Amer: >60 mL/min (ref >60)
GFR calc non Af Amer: 57 mL/min — ABNORMAL LOW (ref >60)
Glucose, Bld: 129 mg/dL — ABNORMAL HIGH (ref 70–99)
Potassium: 3.8 mmol/L (ref 3.5–5.1)
Sodium: 135 mmol/L (ref 135–145)
Total Bilirubin: 5 mg/dL — ABNORMAL HIGH (ref 0.3–1.2)
Total Protein: 6.6 g/dL (ref 6.5–8.1)

## 2019-09-27 LAB — CBC WITH DIFFERENTIAL/PLATELET
Abs Immature Granulocytes: 0.04 10*3/uL (ref 0.00–0.07)
Basophils Absolute: 0 10*3/uL (ref 0.0–0.1)
Basophils Relative: 0 %
Eosinophils Absolute: 0.1 10*3/uL (ref 0.0–0.5)
Eosinophils Relative: 1 %
HCT: 39.7 % (ref 36.0–46.0)
Hemoglobin: 12.9 g/dL (ref 12.0–15.0)
Immature Granulocytes: 1 %
Lymphocytes Relative: 12 %
Lymphs Abs: 1 10*3/uL (ref 0.7–4.0)
MCH: 30.1 pg (ref 26.0–34.0)
MCHC: 32.5 g/dL (ref 30.0–36.0)
MCV: 92.8 fL (ref 80.0–100.0)
Monocytes Absolute: 0.7 10*3/uL (ref 0.1–1.0)
Monocytes Relative: 9 %
Neutro Abs: 6.3 10*3/uL (ref 1.7–7.7)
Neutrophils Relative %: 77 %
Platelets: 37 10*3/uL — ABNORMAL LOW (ref 150–400)
RBC: 4.28 MIL/uL (ref 3.87–5.11)
RDW: 16 % — ABNORMAL HIGH (ref 11.5–15.5)
WBC: 8.1 10*3/uL (ref 4.0–10.5)
nRBC: 0 % (ref 0.0–0.2)

## 2019-09-27 LAB — LIPASE, BLOOD: Lipase: 37 U/L (ref 11–51)

## 2019-09-27 LAB — TYPE AND SCREEN
ABO/RH(D): O POS
Antibody Screen: NEGATIVE

## 2019-09-27 LAB — SARS CORONAVIRUS 2 BY RT PCR (HOSPITAL ORDER, PERFORMED IN ~~LOC~~ HOSPITAL LAB): SARS Coronavirus 2: NEGATIVE

## 2019-09-27 LAB — PROTIME-INR
INR: 1.3 — ABNORMAL HIGH (ref 0.8–1.2)
Prothrombin Time: 16.1 seconds — ABNORMAL HIGH (ref 11.4–15.2)

## 2019-09-27 MED ORDER — MORPHINE SULFATE (PF) 4 MG/ML IV SOLN
6.0000 mg | Freq: Once | INTRAVENOUS | Status: AC
  Administered 2019-09-27: 6 mg via INTRAVENOUS
  Filled 2019-09-27: qty 2

## 2019-09-27 MED ORDER — SERTRALINE HCL 50 MG PO TABS
200.0000 mg | ORAL_TABLET | Freq: Every day | ORAL | Status: DC
  Administered 2019-09-28 – 2019-10-06 (×9): 200 mg via ORAL
  Filled 2019-09-27 (×9): qty 4

## 2019-09-27 MED ORDER — SODIUM CHLORIDE 0.9 % IV SOLN: INTRAVENOUS | Status: DC

## 2019-09-27 MED ORDER — IPRATROPIUM-ALBUTEROL 20-100 MCG/ACT IN AERS: 1.0000 | INHALATION_SPRAY | Freq: Three times a day (TID) | RESPIRATORY_TRACT | Status: DC

## 2019-09-27 MED ORDER — DESMOPRESSIN ACE SPRAY REFRIG 0.01 % NA SOLN
1.0000 | Freq: Two times a day (BID) | NASAL | Status: DC | PRN
  Administered 2019-09-28: 1 via NASAL
  Filled 2019-09-27: qty 5

## 2019-09-27 MED ORDER — ONDANSETRON HCL 4 MG PO TABS: 4.0000 mg | ORAL_TABLET | Freq: Four times a day (QID) | ORAL | Status: DC | PRN

## 2019-09-27 MED ORDER — FUROSEMIDE 40 MG PO TABS
20.0000 mg | ORAL_TABLET | Freq: Every day | ORAL | Status: DC
  Administered 2019-09-28: 20 mg via ORAL

## 2019-09-27 MED ORDER — ALBUTEROL SULFATE HFA 108 (90 BASE) MCG/ACT IN AERS: 2.0000 | INHALATION_SPRAY | Freq: Four times a day (QID) | RESPIRATORY_TRACT | Status: DC | PRN

## 2019-09-27 MED ORDER — ALBUTEROL SULFATE (2.5 MG/3ML) 0.083% IN NEBU: 2.5000 mg | INHALATION_SOLUTION | Freq: Four times a day (QID) | RESPIRATORY_TRACT | Status: DC | PRN

## 2019-09-27 MED ORDER — TRAZODONE HCL 100 MG PO TABS
200.0000 mg | ORAL_TABLET | Freq: Every day | ORAL | Status: DC
  Administered 2019-09-29 – 2019-10-05 (×7): 200 mg via ORAL
  Filled 2019-09-27 (×7): qty 2

## 2019-09-27 MED ORDER — ONDANSETRON HCL 4 MG/2ML IJ SOLN: 4.0000 mg | Freq: Four times a day (QID) | INTRAMUSCULAR | Status: DC | PRN

## 2019-09-27 MED ORDER — LIDOCAINE HCL URETHRAL/MUCOSAL 2 % EX GEL
1.0000 "application " | Freq: Once | CUTANEOUS | Status: DC
  Filled 2019-09-27: qty 11

## 2019-09-27 MED ORDER — SODIUM CHLORIDE (PF) 0.9 % IJ SOLN
INTRAMUSCULAR | Status: AC
  Filled 2019-09-27: qty 50

## 2019-09-27 MED ORDER — IPRATROPIUM-ALBUTEROL 0.5-2.5 (3) MG/3ML IN SOLN: 3.0000 mL | Freq: Three times a day (TID) | RESPIRATORY_TRACT | Status: DC

## 2019-09-27 MED ORDER — AMLODIPINE BESYLATE 5 MG PO TABS
10.0000 mg | ORAL_TABLET | Freq: Every day | ORAL | Status: DC
  Administered 2019-09-28 – 2019-09-30 (×3): 10 mg via ORAL
  Filled 2019-09-27 (×3): qty 2

## 2019-09-27 MED ORDER — IOHEXOL 300 MG/ML  SOLN
100.0000 mL | Freq: Once | INTRAMUSCULAR | Status: AC | PRN
  Administered 2019-09-27: 100 mL via INTRAVENOUS

## 2019-09-27 NOTE — ED Notes (Signed)
ED TO INPATIENT HANDOFF REPORT  Name/Age/Gender Jessica Cline 54 y.o. female  Code Status    Code Status Orders  (From admission, onward)         Start     Ordered   09/27/19 2046  Full code  Continuous        09/27/19 2046        Code Status History    This patient has a current code status but no historical code status.   Advance Care Planning Activity      Home/SNF/Other Home  Chief Complaint SBO (small bowel obstruction) (HCC) [K56.609]  Level of Care/Admitting Diagnosis ED Disposition    ED Disposition Condition Comment   Admit  Hospital Area: [facility] [[id number]]  Level of Care: Med-Surg [16]  Covid Evaluation: Asymptomatic Screening Protocol (No Symptoms)  Diagnosis: SBO (small bowel obstruction) [facility]) [725366]  Admitting Physician: Hillary Bow [phone]  Attending Physician: Hillary Bow [phone]       Medical History Past Medical History:  Diagnosis Date   Anxiety    Arthritis    Blood transfusion without reported diagnosis    Depression    HTN (hypertension)    Thrombocytopenia (HCC)     Allergies Allergies  Allergen Reactions   Plasma, Human Swelling    IV Location/Drains/Wounds Patient Lines/Drains/Airways Status    Active Line/Drains/Airways    Name Placement date Placement time Site Days   Peripheral IV 09/27/19 Anterior;Left;Proximal Forearm 09/27/19  1653  Forearm  less than 1          Labs/Imaging Results for orders placed or performed during the hospital encounter of 09/27/19 (from the past 48 hour(s))  CBC with Differential/Platelet     Status: Abnormal   Collection Time: 09/27/19  5:00 PM  Result Value Ref Range   WBC 8.1 4.0 - 10.5 K/uL   RBC 4.28 3.87 - 5.11 MIL/uL   Hemoglobin 12.9 12.0 - 15.0 g/dL   HCT 59.5 36 - 46 %   MCV 92.8 80.0 - 100.0 fL   MCH 30.1 26.0 - 34.0 pg   MCHC 32.5 30.0 - 36.0 g/dL   RDW 63.8 (H) 75.6 - 43.3 %   Platelets 37 (L) 150 - 400 K/uL    Comment:  SPECIMEN CHECKED FOR CLOTS Immature Platelet Fraction may be clinically indicated, consider ordering this additional test [id number] PLATELET COUNT CONFIRMED BY SMEAR REPEATED TO VERIFY    nRBC 0.0 0.0 - 0.2 %   Neutrophils Relative % 77 %   Neutro Abs 6.3 1.7 - 7.7 K/uL   Lymphocytes Relative 12 %   Lymphs Abs 1.0 0.7 - 4.0 K/uL   Monocytes Relative 9 %   Monocytes Absolute 0.7 0 - 1 K/uL   Eosinophils Relative 1 %   Eosinophils Absolute 0.1 0 - 0 K/uL   Basophils Relative 0 %   Basophils Absolute 0.0 0 - 0 K/uL   Immature Granulocytes 1 %   Abs Immature Granulocytes 0.04 0.00 - 0.07 K/uL   Polychromasia PRESENT    Target Cells PRESENT     Comment: Performed at [facility], [address], [place], [place] [address]  Comprehensive metabolic panel     Status: Abnormal   Collection Time: 09/27/19  5:00 PM  Result Value Ref Range   Sodium 135 135 - 145 mmol/L   Potassium 3.8 3.5 - 5.1 mmol/L   Chloride 98 98 - 111 mmol/L   CO2 25 22 - 32 mmol/L  Glucose, Bld 129 (H) 70 - 99 mg/dL    Comment: Glucose reference range applies only to samples taken after fasting for at least 8 hours.   BUN 25 (H) 6 - 20 mg/dL   Creatinine, Ser 7.90 (H) 0.44 - 1.00 mg/dL   Calcium 8.8 (L) 8.9 - 10.3 mg/dL   Total Protein 6.6 6.5 - 8.1 g/dL   Albumin 3.5 3.5 - 5.0 g/dL   AST 88 (H) 15 - 41 U/L   ALT 47 (H) 0 - 44 U/L   Alkaline Phosphatase 166 (H) 38 - 126 U/L   Total Bilirubin 5.0 (H) 0.3 - 1.2 mg/dL   GFR calc non Af Amer 57 (L) >60 mL/min   GFR calc Af Amer >60 >60 mL/min   Anion gap 12 5 - 15    Comment: Performed at [facility], [address] [place], [place] [address]  Type and screen     Status: None   Collection Time: 09/27/19  5:00 PM  Result Value Ref Range   ABO/RH(D) O POS    Antibody Screen NEG    Sample Expiration      09/30/2019,2359 Performed at [facility], [address], [place], [place] [address]   Lipase,  blood     Status: None   Collection Time: 09/27/19  5:00 PM  Result Value Ref Range   Lipase 37 11 - 51 U/L    Comment: Performed at [facility], [address], [place], [place] [address]  Protime-INR     Status: Abnormal   Collection Time: 09/27/19  8:25 PM  Result Value Ref Range   Prothrombin Time 16.1 (H) 11.4 - 15.2 seconds   INR 1.3 (H) 0.8 - 1.2    Comment: (NOTE) INR goal varies based on device and disease states. Performed at [facility], [address], [place], [place] [address]    CT Abdomen Pelvis W Contrast  Result Date: 09/27/2019 CLINICAL DATA:  Abdominal distension and low platelets. EXAM: CT ABDOMEN AND PELVIS WITH CONTRAST TECHNIQUE: Multidetector CT imaging of the abdomen and pelvis was performed using the standard protocol following bolus administration of intravenous contrast. CONTRAST:  OMNIPAQUE IOHEXOL 300 MG/ML  SOLN COMPARISON:  None. FINDINGS: Lower chest: No acute abnormality. Hepatobiliary: The liver is cirrhotic in appearance. Numerous tiny gallstones are seen within the lumen of a normal appearing gallbladder. Pancreas: Unremarkable. No pancreatic ductal dilatation or surrounding inflammatory changes. Spleen: There is mild to moderate severity splenomegaly. Adrenals/Urinary Tract: Adrenal glands are unremarkable. Kidneys are normal, without renal calculi, focal lesion, or hydronephrosis. Bladder is unremarkable. Stomach/Bowel: Stomach is within normal limits. The appendix is not clearly identified. A dilated loop of mid to distal small bowel is seen within the mid abdomen (maximum small bowel diameter of approximately 4.4 cm). A gradual transition zone is seen along the anterior medial aspect of the mid abdomen. Vascular/Lymphatic: No significant vascular findings are present. No enlarged abdominal or pelvic lymph nodes. Reproductive: Uterus and bilateral adnexa are unremarkable. Other: No abdominal wall hernia or  abnormality. A large amount of nonhemorrhagic abdominal and pelvic free fluid is noted (approximately 12.16 Hounsfield units). Musculoskeletal: Moderate to marked severity anasarca is seen throughout the abdominal and pelvic walls. Multiple chronic posterolateral right-sided rib fractures are seen. Degenerative changes seen within the lumbar spine. IMPRESSION: 1. Findings consistent with a proximal small bowel obstruction. 2. Large amount of nonhemorrhagic abdominal and pelvic free fluid. 3. Cirrhosis with splenomegaly. 4.  Cholelithiasis. Electronically Signed   By: Aram Candela M.D.   On: 09/27/2019 19:00   DG Chest Port 1 View  Result Date: 09/27/2019 CLINICAL DATA:  Shortness of breath EXAM: PORTABLE CHEST 1 VIEW COMPARISON:  None. FINDINGS: Multiple old right-sided rib fractures. No focal opacity or pleural effusion. Normal heart size. No pneumothorax. IMPRESSION: No active disease. Electronically Signed   By: Jasmine Pang M.D.   On: 09/27/2019 17:16    Pending Labs Unresulted Labs (From admission, onward) Comment          Start     Ordered   09/28/19 0500  CBC  Tomorrow morning,   R        09/27/19 2046   09/28/19 0500  Comprehensive metabolic panel  Tomorrow morning,   R        09/27/19 2046   09/27/19 2046  HIV Antibody (routine testing w rflx)  (HIV Antibody (Routine testing w reflex) panel)  Once,   STAT        09/27/19 2046   09/27/19 1912  SARS Coronavirus 2 by RT PCR (hospital order, performed in [facility] hospital lab) Nasopharyngeal Nasopharyngeal Swab  (Tier 2 (TAT 2 hrs))  Once,   STAT       Question Answer Comment  Is this test for diagnosis or screening Screening   Symptomatic for COVID-19 as defined by CDC No   Hospitalized for COVID-19 No   Admitted to ICU for COVID-19 No   Previously tested for COVID-19 No   Resident in a congregate (group) care setting No   Employed in healthcare setting No   Pregnant No   Has patient completed COVID vaccination(s) (2 doses of  Pfizer/Moderna 1 dose of [employer]) No      09/27/19 1911   09/27/19 1900  ABO/Rh  Once,   R        09/27/19 1900   09/27/19 1540  Urinalysis, Routine w reflex microscopic  ONCE - STAT,   STAT        09/27/19 1540          Vitals/Pain Today's Vitals   09/27/19 1625 09/27/19 1645 09/27/19 1900 09/27/19 2031  BP: (!) 167/80 (!) 160/77 (!) 142/78 132/67  Pulse: (!) 104 (!) 103 (!) 103 (!) 105  Resp: 20 18 16    Temp: 97.6 F (36.4 C)     TempSrc: Oral     SpO2: 100% 98% 96% 96%  PainSc:        Isolation Precautions No active isolations  Medications Medications  0.9 %  sodium chloride infusion ( Intravenous Stopped 09/27/19 2059)  sodium chloride (PF) 0.9 % injection (has no administration in time range)  ondansetron (ZOFRAN) tablet 4 mg (has no administration in time range)    Or  ondansetron (ZOFRAN) injection 4 mg (has no administration in time range)  iohexol (OMNIPAQUE) 300 MG/ML solution 100 mL (100 mLs Intravenous Contrast Given 09/27/19 1808)  morphine 4 MG/ML injection 6 mg (6 mg Intravenous Given 09/27/19 2050)    Mobility Walks w/ walker

## 2019-09-27 NOTE — H&P (Addendum)
History and Physical    Jessica Cline WUJ:[id number] DOB: Oct 06, 1965 DOA: 09/27/2019  PCP: Karene Fryriggers, Gerome ApleyLaura A, PA-C  Patient coming from: Home  I have personally briefly reviewed patient's old medical records in [facility]  Chief Complaint: Abd pain  HPI: Jessica Prontoammy L Krumholz is a 54 y.o. female with medical history significant of cirrhosis, thrombocytopenia, splenomegaly, HTN.  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 Viral hepatitis w/u was negative. Small varicies seen on MRI, hasnt had EGD due to thrombocytopenia  Pt presents to the ED with worsening peripheral edema.  She has been having chronic abdominal pain with questionable intermittent emesis.  She has been having bowel movements daily.  Abd pain is LLQ and chronic.   ED Course: CT abd/pelvis shows: large amount of ascites and a cirrhotic appearing liver as well as a distended loop of small bowel in the central abdomen.  Gen surg consulted due to concern for SBO.  Hospitalist asked to admit.   Review of Systems: As per HPI, otherwise all review of systems negative.  Past Medical History:  Diagnosis Date  . Anxiety   . Arthritis   . Blood transfusion without reported diagnosis   . Depression   . HTN (hypertension)   . Thrombocytopenia (HCC)     Past Surgical History:  Procedure Laterality Date  . ANKLE SURGERY Left   . KNEE ARTHROSCOPY Left   . OVARY SURGERY Right    due to cyst or tumor  . PITUITARY SURGERY     tumor removed  . thumb SURGERY    . TONSILLECTOMY    . WRIST SURGERY Left      reports that she has never smoked. Her smokeless tobacco use includes snuff. She reports previous alcohol use. She reports current drug use. Drug: Marijuana.  Allergies  Allergen Reactions  . Plasma, Human Swelling    Family History  Problem Relation Age of Onset  . Pancreatic cancer Father   .  Breast cancer Maternal Grandmother      Prior to Admission medications   Medication Sig Start Date End Date Taking? Authorizing Provider  albuterol (PROVENTIL HFA;VENTOLIN HFA) 108 (90 Base) MCG/ACT inhaler Inhale 2 puffs into the lungs every 6 (six) hours as needed. Reported on 08/07/2015    [provider]  amLODipine (NORVASC) 10 MG tablet Take 10 mg by mouth daily.  01/24/15   [provider]  celecoxib (CELEBREX) 100 MG capsule Take 100 mg by mouth daily. 03/18/16   [provider]  desmopressin (DDAVP) 0.01 % SOLN Place 1 spray into the nose 2 (two) times daily.  12/25/14   [provider]  dexamethasone (DECADRON) 4 MG tablet Take 10 tablets (40 mg total) by mouth daily. starting 5/3 With food in am for 4 days total 07/21/19   Creig Hinesao, Archana C, MD  furosemide (LASIX) 20 MG tablet Take 1 tablet (20 mg total) by mouth daily. 08/11/19   Creig Hinesao, Archana C, MD  sertraline (ZOLOFT) 100 MG tablet Take 200 mg by mouth daily.  01/24/15   [provider]  traZODone (DESYREL) 100 MG tablet Take 200 mg by mouth at bedtime.  01/24/15   [provider]    Physical Exam: Vitals:   09/27/19 1528 09/27/19 1625 09/27/19 1645 09/27/19 1900  BP: 121/79 (!) 167/80 (!) 160/77 (!) 142/78  Pulse: (!) 109 (!) 104 (!) 103 Marland Kitchen(!)  103  Resp: 20 20 18 16   Temp: 97.7 F (36.5 C) 97.6 F (36.4 C)    TempSrc: Oral Oral    SpO2: 99% 100% 98% 96%    Constitutional: NAD, calm, comfortable Eyes: PERRL, lids and conjunctivae normal ENMT: Mucous membranes are moist. Posterior pharynx clear of any exudate or lesions.Normal dentition.  Neck: normal, supple, no masses, no thyromegaly Respiratory: clear to auscultation bilaterally, no wheezing, no crackles. Normal respiratory effort. No accessory muscle use.  Cardiovascular: Regular rate and rhythm, no murmurs / rubs / gallops. 4+ edema. 2+ pedal pulses. No carotid bruits.  Abdomen: Distended with ascites. Musculoskeletal: no  clubbing / cyanosis. No joint deformity upper and lower extremities. Good ROM, no contractures. Normal muscle tone.  Skin: no rashes, lesions, ulcers. No induration Neurologic: CN 2-12 grossly intact. Sensation intact, DTR normal. Strength 5/5 in all 4.  Psychiatric: Normal judgment and insight. Alert and oriented x 3. Normal mood.    Labs on Admission: I have personally reviewed following labs and imaging studies  CBC: Recent Labs  Lab 09/27/19 1700  WBC 8.1  NEUTROABS 6.3  HGB 12.9  HCT 39.7  MCV 92.8  PLT 37*   Basic Metabolic Panel: Recent Labs  Lab 09/27/19 1700  NA 135  K 3.8  CL 98  CO2 25  GLUCOSE 129*  BUN 25*  CREATININE 1.11*  CALCIUM 8.8*   GFR: CrCl cannot be calculated (Unknown ideal weight.). Liver Function Tests: Recent Labs  Lab 09/27/19 1700  AST 88*  ALT 47*  ALKPHOS 166*  BILITOT 5.0*  PROT 6.6  ALBUMIN 3.5   Recent Labs  Lab 09/27/19 1700  LIPASE 37   No results for input(s): AMMONIA in the last 168 hours. Coagulation Profile: No results for input(s): INR, PROTIME in the last 168 hours. Cardiac Enzymes: No results for input(s): CKTOTAL, CKMB, CKMBINDEX, TROPONINI in the last 168 hours. BNP (last 3 results) No results for input(s): PROBNP in the last 8760 hours. HbA1C: No results for input(s): HGBA1C in the last 72 hours. CBG: No results for input(s): GLUCAP in the last 168 hours. Lipid Profile: No results for input(s): CHOL, HDL, LDLCALC, TRIG, CHOLHDL, LDLDIRECT in the last 72 hours. Thyroid Function Tests: No results for input(s): TSH, T4TOTAL, FREET4, T3FREE, THYROIDAB in the last 72 hours. Anemia Panel: No results for input(s): VITAMINB12, FOLATE, FERRITIN, TIBC, IRON, RETICCTPCT in the last 72 hours. Urine analysis: No results found for: COLORURINE, APPEARANCEUR, LABSPEC, PHURINE, GLUCOSEU, HGBUR, BILIRUBINUR, KETONESUR, PROTEINUR, UROBILINOGEN, NITRITE, LEUKOCYTESUR  Radiological Exams on Admission: CT Abdomen Pelvis W  Contrast  Result Date: 09/27/2019 CLINICAL DATA:  Abdominal distension and low platelets. EXAM: CT ABDOMEN AND PELVIS WITH CONTRAST TECHNIQUE: Multidetector CT imaging of the abdomen and pelvis was performed using the standard protocol following bolus administration of intravenous contrast. CONTRAST:  11/28/2019 OMNIPAQUE IOHEXOL 300 MG/ML  SOLN COMPARISON:  None. FINDINGS: Lower chest: No acute abnormality. Hepatobiliary: The liver is cirrhotic in appearance. Numerous tiny gallstones are seen within the lumen of a normal appearing gallbladder. Pancreas: Unremarkable. No pancreatic ductal dilatation or surrounding inflammatory changes. Spleen: There is mild to moderate severity splenomegaly. Adrenals/Urinary Tract: Adrenal glands are unremarkable. Kidneys are normal, without renal calculi, focal lesion, or hydronephrosis. Bladder is unremarkable. Stomach/Bowel: Stomach is within normal limits. The appendix is not clearly identified. A dilated loop of mid to distal small bowel is seen within the mid abdomen (maximum small bowel diameter of approximately 4.4 cm). A gradual transition zone is seen along the  anterior medial aspect of the mid abdomen. Vascular/Lymphatic: No significant vascular findings are present. No enlarged abdominal or pelvic lymph nodes. Reproductive: Uterus and bilateral adnexa are unremarkable. Other: No abdominal wall hernia or abnormality. A large amount of nonhemorrhagic abdominal and pelvic free fluid is noted (approximately 12.16 Hounsfield units). Musculoskeletal: Moderate to marked severity anasarca is seen throughout the abdominal and pelvic walls. Multiple chronic posterolateral right-sided rib fractures are seen. Degenerative changes seen within the lumbar spine. IMPRESSION: 1. Findings consistent with a proximal small bowel obstruction. 2. Large amount of nonhemorrhagic abdominal and pelvic free fluid. 3. Cirrhosis with splenomegaly. 4. Cholelithiasis. Electronically Signed   By: Aram Candela M.D.   On: 09/27/2019 19:00   DG Chest Port 1 View  Result Date: 09/27/2019 CLINICAL DATA:  Shortness of breath EXAM: PORTABLE CHEST 1 VIEW COMPARISON:  None. FINDINGS: Multiple old right-sided rib fractures. No focal opacity or pleural effusion. Normal heart size. No pneumothorax. IMPRESSION: No active disease. Electronically Signed   By: Jasmine Pang M.D.   On: 09/27/2019 17:16    EKG: Independently reviewed.  Assessment/Plan Principal Problem:   SBO (small bowel obstruction) (HCC) Active Problems:   Thrombocytopenia (HCC)   Alcoholic cirrhosis of liver with ascites (HCC)    1. SBO - clinically seems to be a PSBO vs ileus 1. Dr. Magnus Ivan saw pt, recs: 1. Hold NGT for now 2. Get paracentesis in AM to see if this helps 2. US paracentesis ordered for AM for her large volume ascites. 3. Will keep pt NPO except sips with meds and ice chips 4. Wont do additional IVF at the moment though given that she has frank anasarca with large volume ascites. 5. Lab work including cytology on ascitic fluid 1. Pt serum albumin 3.5 which is normal... 2. Cirrhosis of liver with ascites, splenomegaly, and thrombocytopenia - 1. Today will be first episode of "decompensated" cirrhosis 2. Cont lasix 20 daily for the moment (likely needs to be titrated up but dont want to do this while NPO) 3. INR pending 3. On chronic "as needed" DDAVP - 1. Dont see formal diagnosis of DI in chart, but looks like she has been on this medication chronically for many years. 2. 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 3. Sodium nl today at 135 4. In any case, im going to leave this medication alone (and just order it) for now.  DVT prophylaxis: SCDs Code Status: Full Family Communication: No family in room Disposition Plan: Home after PSBO / ileus resolved Consults called: Gen surg Admission status: Place in 81    Shemeca Lukasik M. DO [facility]  How to contact the [facility]  Attending or Consulting provider 7A - 7P or covering provider during after hours 7P -7A, for this patient?  1. Check the care team in [facility] and look for a) attending/consulting [facility] provider listed and b) the [facility] team listed 2. Log into [email or web address]  Amion Physician Scheduling and messaging for groups and whole hospitals  On call and physician scheduling software for group practices, residents, hospitalists and other medical providers for call, clinic, rotation and shift schedules. OnCall [employer] is a hospital-wide system for scheduling doctors and paging doctors on call. EasyPlot is for scientific plotting and data analysis.  [email or web address]  and use [facility]'s universal password to access. If you do not have the password, please contact the hospital operator.  3. Locate the [facility] provider you are looking for under [facility] and page to a  number that you can be directly reached. 4. If you still have difficulty reaching the provider, please page the [facility] (Director on Call) for the Hospitalists listed on amion for assistance.  09/27/2019, 8:45 PM

## 2019-09-27 NOTE — ED Notes (Addendum)
Attempted to call report to 3E, but RN is unable to take report at this time. Will attempt to call again in 5 minutes.

## 2019-09-27 NOTE — ED Provider Notes (Signed)
[facility]-EMERGENCY DEPT Provider Note   CSN: [id number] Arrival date & time: 09/27/19  1515     History Chief Complaint  Patient presents with   Abdominal Pain    Jessica Cline is a 54 y.o. female.  54 year old female with history of thrombocytopenia and ascites who presents with increased body edema. Patient is had a sense of work-up for her thrombocytopenia without resolve. 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 Just completed a course of prednisone about a month ago. States that the swelling in her legs has gotten worse to the point where it hurts to walk. Has been using her mother's walker. Denies being short of breath. Has had chronic abdominal pain for several months in her left lower quadrant. Denies any fever or chills. States the pain is been persistent and really unchanged. Denies any urinary symptoms. Called her doctor who told her to come here.        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      Family History  Problem Relation Age of Onset   Pancreatic cancer Father    Breast cancer Maternal Grandmother     Social History   Tobacco Use   Smoking status: Never Smoker   Smokeless tobacco: Current User    Types: Snuff  Vaping Use   Vaping Use: Never used  Substance Use Topics   Alcohol use: Not Currently    Alcohol/week: 0.0 standard drinks   Drug use: Yes    Types: Marijuana    Home Medications Prior to Admission medications   Medication Sig Start Date End Date Taking? Authorizing Provider  albuterol (PROVENTIL HFA;VENTOLIN HFA) 108 (90 Base) MCG/ACT inhaler Inhale 2 puffs into the lungs every 6 (six) hours as needed. Reported on 08/07/2015    [provider]  amLODipine (NORVASC) 10 MG tablet Take 10 mg by mouth daily.  01/24/15   [provider]  celecoxib (CELEBREX) 100 MG capsule Take 100 mg by mouth daily. 03/18/16   [provider]  desmopressin (DDAVP) 0.01 % SOLN Place 1 spray into the nose 2 (two) times daily.  12/25/14   [provider]  dexamethasone (DECADRON) 4 MG tablet Take 10 tablets (40 mg total) by mouth daily. starting 5/3 With food in am for 4 days total 07/21/19   Creig Hines, MD  furosemide (LASIX) 20 MG tablet Take 1 tablet (20 mg total) by mouth  daily. 08/11/19   Creig Hines, MD  sertraline (ZOLOFT) 100 MG tablet Take 200 mg by mouth daily.  01/24/15   [provider]  traZODone (DESYREL) 100 MG tablet Take 200 mg by mouth at bedtime.  01/24/15   [provider]    Allergies    Plasma, human  Review of Systems   Review of Systems  All other systems reviewed and are negative.   Physical Exam Updated Vital Signs BP (!) 167/80 (BP Location: Right Arm)    Pulse (!) 104    Temp 97.6 F (36.4 C) (Oral)    Resp 20    SpO2 100%   Physical Exam Vitals and nursing note reviewed.  Constitutional:      General: She is not in acute distress.    Appearance: Normal appearance. She is  well-developed. She is not toxic-appearing.  HENT:     Head: Normocephalic and atraumatic.  Eyes:     General: Lids are normal.     Conjunctiva/sclera: Conjunctivae normal.     Pupils: Pupils are equal, round, and reactive to light.  Neck:     Thyroid: No thyroid mass.     Trachea: No tracheal deviation.  Cardiovascular:     Rate and Rhythm: Normal rate and regular rhythm.     Heart sounds: Normal heart sounds. No murmur heard.  No gallop.   Pulmonary:     Effort: Pulmonary effort is normal. No respiratory distress.     Breath sounds: Normal breath sounds. No stridor. No decreased breath sounds, wheezing, rhonchi or rales.  Abdominal:     General: Bowel sounds are normal. There is distension.     Palpations: Abdomen is soft.     Tenderness: There is abdominal tenderness in the left lower quadrant. There is no rebound.       Comments: No peritoneal signs  Musculoskeletal:        General: No tenderness. Normal range of motion.     Cervical back: Normal range of motion and neck supple.  Skin:    General: Skin is warm and dry.     Findings: No abrasion or rash.  Neurological:     Mental Status: She is alert and oriented to person, place, and time.     GCS: GCS eye subscore is 4. GCS verbal subscore is 5. GCS motor subscore is 6.     Cranial Nerves: No cranial nerve deficit.     Sensory: No sensory deficit.  Psychiatric:        Speech: Speech normal.        Behavior: Behavior normal.     ED Results / Procedures / Treatments   Labs (all labs ordered are listed, but only abnormal results are displayed) Labs Reviewed  CBC WITH DIFFERENTIAL/PLATELET  COMPREHENSIVE METABOLIC PANEL  LIPASE, BLOOD  URINALYSIS, ROUTINE W REFLEX MICROSCOPIC  TYPE AND SCREEN    EKG None  Radiology No results found.  Procedures Procedures (including critical care time)  Medications Ordered in ED Medications  0.9 %  sodium chloride infusion (has no administration in time range)    ED  Course  I have reviewed the triage vital signs and the nursing notes.  Pertinent labs & imaging results that were available during my care of the patient were reviewed by me and considered in my medical decision making (see chart for details).    MDM Rules/Calculators/A&P  Patient with evidence of chronic thrombocytopenia here.  Abdominal CT shows ascites but also proximal small bowel obstruction.  Will consult general surgery and likely admit to the medicine service Final Clinical Impression(s) / ED Diagnoses Final diagnoses:  None    Rx / DC Orders ED Discharge Orders    None       Lorre Nick, MD 09/27/19 1911
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
## 2019-09-27 NOTE — ED Triage Notes (Signed)
Per pt, states she has been having trouble with low platelets, swelling, abdominal distention-states she has recently finished prednisone and lasix-sent here for decreased mobility

## 2019-09-27 NOTE — ED Notes (Signed)
Transport called to take pt to 3E 

## 2019-09-28 ENCOUNTER — Observation Stay (HOSPITAL_COMMUNITY): Payer: Medicare Other

## 2019-09-28 DIAGNOSIS — D6862 Lupus anticoagulant syndrome: Secondary | ICD-10-CM | POA: Diagnosis present

## 2019-09-28 DIAGNOSIS — Z79899 Other long term (current) drug therapy: Secondary | ICD-10-CM | POA: Diagnosis not present

## 2019-09-28 DIAGNOSIS — I8510 Secondary esophageal varices without bleeding: Secondary | ICD-10-CM | POA: Diagnosis present

## 2019-09-28 DIAGNOSIS — K56609 Unspecified intestinal obstruction, unspecified as to partial versus complete obstruction: Secondary | ICD-10-CM | POA: Diagnosis present

## 2019-09-28 DIAGNOSIS — F419 Anxiety disorder, unspecified: Secondary | ICD-10-CM | POA: Diagnosis present

## 2019-09-28 DIAGNOSIS — D6959 Other secondary thrombocytopenia: Secondary | ICD-10-CM | POA: Diagnosis present

## 2019-09-28 DIAGNOSIS — Z91048 Other nonmedicinal substance allergy status: Secondary | ICD-10-CM | POA: Diagnosis not present

## 2019-09-28 DIAGNOSIS — D649 Anemia, unspecified: Secondary | ICD-10-CM | POA: Diagnosis present

## 2019-09-28 DIAGNOSIS — K7581 Nonalcoholic steatohepatitis (NASH): Secondary | ICD-10-CM

## 2019-09-28 DIAGNOSIS — D696 Thrombocytopenia, unspecified: Secondary | ICD-10-CM

## 2019-09-28 DIAGNOSIS — K7031 Alcoholic cirrhosis of liver with ascites: Secondary | ICD-10-CM | POA: Diagnosis present

## 2019-09-28 DIAGNOSIS — K560 Paralytic ileus: Secondary | ICD-10-CM | POA: Diagnosis present

## 2019-09-28 DIAGNOSIS — F17220 Nicotine dependence, chewing tobacco, uncomplicated: Secondary | ICD-10-CM | POA: Diagnosis present

## 2019-09-28 DIAGNOSIS — I110 Hypertensive heart disease with heart failure: Secondary | ICD-10-CM | POA: Diagnosis present

## 2019-09-28 DIAGNOSIS — I5031 Acute diastolic (congestive) heart failure: Secondary | ICD-10-CM | POA: Diagnosis not present

## 2019-09-28 DIAGNOSIS — R601 Generalized edema: Secondary | ICD-10-CM | POA: Diagnosis not present

## 2019-09-28 DIAGNOSIS — K56600 Partial intestinal obstruction, unspecified as to cause: Secondary | ICD-10-CM | POA: Diagnosis present

## 2019-09-28 DIAGNOSIS — F329 Major depressive disorder, single episode, unspecified: Secondary | ICD-10-CM | POA: Diagnosis present

## 2019-09-28 DIAGNOSIS — K7290 Hepatic failure, unspecified without coma: Secondary | ICD-10-CM | POA: Diagnosis not present

## 2019-09-28 DIAGNOSIS — E232 Diabetes insipidus: Secondary | ICD-10-CM | POA: Diagnosis present

## 2019-09-28 DIAGNOSIS — Z6841 Body Mass Index (BMI) 40.0 and over, adult: Secondary | ICD-10-CM | POA: Diagnosis not present

## 2019-09-28 DIAGNOSIS — K766 Portal hypertension: Secondary | ICD-10-CM | POA: Diagnosis present

## 2019-09-28 DIAGNOSIS — R188 Other ascites: Secondary | ICD-10-CM | POA: Diagnosis not present

## 2019-09-28 DIAGNOSIS — Z20822 Contact with and (suspected) exposure to covid-19: Secondary | ICD-10-CM | POA: Diagnosis present

## 2019-09-28 DIAGNOSIS — K8020 Calculus of gallbladder without cholecystitis without obstruction: Secondary | ICD-10-CM | POA: Diagnosis present

## 2019-09-28 DIAGNOSIS — K567 Ileus, unspecified: Secondary | ICD-10-CM | POA: Diagnosis not present

## 2019-09-28 DIAGNOSIS — K7460 Unspecified cirrhosis of liver: Secondary | ICD-10-CM

## 2019-09-28 LAB — COMPREHENSIVE METABOLIC PANEL
ALT: 38 U/L (ref 0–44)
AST: 62 U/L — ABNORMAL HIGH (ref 15–41)
Albumin: 2.9 g/dL — ABNORMAL LOW (ref 3.5–5.0)
Alkaline Phosphatase: 132 U/L — ABNORMAL HIGH (ref 38–126)
Anion gap: 9 (ref 5–15)
BUN: 27 mg/dL — ABNORMAL HIGH (ref 6–20)
CO2: 25 mmol/L (ref 22–32)
Calcium: 8.5 mg/dL — ABNORMAL LOW (ref 8.9–10.3)
Chloride: 100 mmol/L (ref 98–111)
Creatinine, Ser: 1.04 mg/dL — ABNORMAL HIGH (ref 0.44–1.00)
GFR calc Af Amer: >60 mL/min (ref >60)
GFR calc non Af Amer: >60 mL/min (ref >60)
Glucose, Bld: 97 mg/dL (ref 70–99)
Potassium: 3.9 mmol/L (ref 3.5–5.1)
Sodium: 134 mmol/L — ABNORMAL LOW (ref 135–145)
Total Bilirubin: 3.9 mg/dL — ABNORMAL HIGH (ref 0.3–1.2)
Total Protein: 5.7 g/dL — ABNORMAL LOW (ref 6.5–8.1)

## 2019-09-28 LAB — PROTEIN, PLEURAL OR PERITONEAL FLUID: Total protein, fluid: <3 g/dL

## 2019-09-28 LAB — BODY FLUID CELL COUNT WITH DIFFERENTIAL
Lymphs, Fluid: 23 %
Monocyte-Macrophage-Serous Fluid: 69 % (ref 50–90)
Neutrophil Count, Fluid: 8 % (ref 0–25)
Total Nucleated Cell Count, Fluid: 64 cu mm (ref 0–1000)

## 2019-09-28 LAB — URINALYSIS, ROUTINE W REFLEX MICROSCOPIC
Bilirubin Urine: NEGATIVE
Glucose, UA: NEGATIVE mg/dL
Ketones, ur: NEGATIVE mg/dL
Leukocytes,Ua: NEGATIVE
Nitrite: NEGATIVE
Protein, ur: NEGATIVE mg/dL
Specific Gravity, Urine: >1.046 — ABNORMAL HIGH (ref 1.005–1.030)
pH: 5 (ref 5.0–8.0)

## 2019-09-28 LAB — CBC
HCT: 36.1 % (ref 36.0–46.0)
Hemoglobin: 11.6 g/dL — ABNORMAL LOW (ref 12.0–15.0)
MCH: 30.6 pg (ref 26.0–34.0)
MCHC: 32.1 g/dL (ref 30.0–36.0)
MCV: 95.3 fL (ref 80.0–100.0)
Platelets: 28 10*3/uL — CL (ref 150–400)
RBC: 3.79 MIL/uL — ABNORMAL LOW (ref 3.87–5.11)
RDW: 16.1 % — ABNORMAL HIGH (ref 11.5–15.5)
WBC: 5.4 10*3/uL (ref 4.0–10.5)
nRBC: 0 % (ref 0.0–0.2)

## 2019-09-28 LAB — HIV ANTIBODY (ROUTINE TESTING W REFLEX): HIV Screen 4th Generation wRfx: NONREACTIVE

## 2019-09-28 LAB — AMMONIA: Ammonia: 68 umol/L — ABNORMAL HIGH (ref 9–35)

## 2019-09-28 LAB — ABO/RH: ABO/RH(D): O POS

## 2019-09-28 MED ORDER — FUROSEMIDE 10 MG/ML IJ SOLN
40.0000 mg | Freq: Every day | INTRAMUSCULAR | Status: DC
  Administered 2019-09-29 – 2019-09-30 (×2): 40 mg via INTRAVENOUS
  Filled 2019-09-28 (×4): qty 4

## 2019-09-28 MED ORDER — LIDOCAINE HCL 1 % IJ SOLN
INTRAMUSCULAR | Status: AC
  Filled 2019-09-28: qty 20

## 2019-09-28 MED ORDER — IPRATROPIUM-ALBUTEROL 0.5-2.5 (3) MG/3ML IN SOLN
3.0000 mL | RESPIRATORY_TRACT | Status: DC | PRN
  Administered 2019-09-29 – 2019-10-06 (×2): 3 mL via RESPIRATORY_TRACT
  Filled 2019-09-28 (×2): qty 3

## 2019-09-28 MED ORDER — SPIRONOLACTONE 100 MG PO TABS
100.0000 mg | ORAL_TABLET | Freq: Every day | ORAL | Status: DC
  Administered 2019-09-28 – 2019-10-01 (×4): 100 mg via ORAL
  Filled 2019-09-28 (×4): qty 1

## 2019-09-28 NOTE — Consult Note (Addendum)
Consultation  Referring Provider:  [facility] Primary Care Physician:  Driggers, Hewitt Shorts, PA-C Primary Gastroenterologist:  Lemmie Evens  Reason for Consultation: Decompensated cirrhosis  HPI: Jessica Cline is a 54 y.o. female, known to Dr. Tarri Glenn with history of Nash/EtOH induced cirrhosis.  Patient to date has had fairly compensated cirrhosis with last M ELD calculated at 10 in October 2020. Patient presented to the emergency room yesterday with progressive complaints of abdominal pain discomfort distention and new peripheral edema. CT of the abdomen and pelvis yesterday shows a large amount of ascites and cirrhotic appearing liver.  She has a distended loop of small bowel in the central abdomen felt to be mid to distal small bowel measuring up to 4.4 cm, with transition zone.  Also noted to have multiple tiny gallstones. Surgery was consulted, and is following, not felt to have any high-grade obstruction and possibly dilated loops secondary to ileus. Labs on 09/27/2019 WBC of 8.1, hemoglobin 12.1 platelets 37,000 INR 1.3/pro time 16.1 COVID-19 negative, creatinine 1.1 T bili 5.0/alk phos 160/AST 88/ALT 47.  Current MELD 16  Chest x-ray on admission negative  She has had a chronic severe thrombocytopenia with platelet count of 21,000 in May 2021 and 25,000 in October 2020. She also had MRI of the abdomen in September 2020 with no evidence of HCC, cirrhotic liver and splenomegaly.  Patient is not a great historian but says that she first noticed some swelling in her legs and in her abdomen about 4 months ago and that symptoms have progressed since.  She has been generally fatigued, and had been having increasing difficulty ambulating etc. She apparently was given a steroid Dosepak by hematology/[place] for her thrombocytopenia within the past month or so.  After that she seemed to notice more extremity edema.  She had been started on Lasix 20 mg p.o. daily as an outpatient without  any improvement in symptoms. She has gained about 30 pounds over the past 4 months. She had been able to tolerate p.o.'s at home though having some nausea had not had any vomiting, and does not relate any acute or abrupt onset of abdominal pain more suggestive of small bowel obstruction.  She underwent large-volume paracentesis this morning with removal of 8.3 L and says her abdomen feels much better now.  She is hungry and asking for food.  She had not been having very good bowel movements over the past week but had been passing small amounts of stool which have been dark.  No fever or chills at home.     Past Medical History:  Diagnosis Date  . Anxiety   . Arthritis   . Blood transfusion without reported diagnosis   . Depression   . HTN (hypertension)   . Thrombocytopenia ([place])     Past Surgical History:  Procedure Laterality Date  . ANKLE SURGERY Left   . KNEE ARTHROSCOPY Left   . OVARY SURGERY Right    due to cyst or tumor  . PITUITARY SURGERY     tumor removed  . thumb SURGERY    . TONSILLECTOMY    . WRIST SURGERY Left     Prior to Admission medications   Medication Sig Start Date End Date Taking? Authorizing Provider  albuterol (PROVENTIL HFA;VENTOLIN HFA) 108 (90 Base) MCG/ACT inhaler Inhale 2 puffs into the lungs every 6 (six) hours as needed for wheezing or shortness of breath. Reported on 08/07/2015   Yes [provider]  amLODipine (NORVASC) 10 MG tablet Take  10 mg by mouth daily.  01/24/15  Yes [provider]  celecoxib (CELEBREX) 100 MG capsule Take 100 mg by mouth daily. 03/18/16  Yes [provider]  COMBIVENT RESPIMAT 20-100 MCG/ACT AERS respimat Inhale 1 puff into the lungs in the morning, at noon, and at bedtime.  09/20/19  Yes [provider]  desmopressin (DDAVP) 0.01 % SOLN Place 1 spray into the nose 2 (two) times daily as needed (to prevent dehydration).  12/25/14  Yes [provider]  furosemide (LASIX) 20 MG  tablet Take 1 tablet (20 mg total) by mouth daily. 08/11/19  Yes Sindy Guadeloupe, MD  sertraline (ZOLOFT) 100 MG tablet Take 200 mg by mouth daily.  01/24/15  Yes [provider]  traZODone (DESYREL) 100 MG tablet Take 200 mg by mouth at bedtime.  01/24/15  Yes [provider]    Current Facility-Administered Medications  Medication Dose Route Frequency Provider Last Rate Last Admin  . 0.9 %  sodium chloride infusion   Intravenous Continuous Lacretia Leigh, MD   Stopped at 09/27/19 2059  . amLODipine (NORVASC) tablet 10 mg  10 mg Oral Daily Alcario Drought, Jared M, DO      . desmopressin (DDAVP) 0.01 % (10 mcg/activation) spray 1 spray  1 spray Nasal BID PRN Etta Quill, DO      . furosemide (LASIX) tablet 20 mg  20 mg Oral Daily Alcario Drought, Jared M, DO      . ipratropium-albuterol (DUONEB) 0.5-2.5 (3) MG/3ML nebulizer solution 3 mL  3 mL Nebulization Q4H PRN Etta Quill, DO      . lidocaine (XYLOCAINE) 1 % (with pres) injection           . ondansetron (ZOFRAN) tablet 4 mg  4 mg Oral Q6H PRN Etta Quill, DO       Or  . ondansetron [place]) injection 4 mg  4 mg Intravenous Q6H PRN Etta Quill, DO      . sertraline (ZOLOFT) tablet 200 mg  200 mg Oral Daily Alcario Drought, Jared M, DO      . traZODone (DESYREL) tablet 200 mg  200 mg Oral QHS Jennette Kettle M, DO        Allergies as of 09/27/2019 - Review Complete 09/27/2019  Allergen Reaction Noted  . Plasma, human Swelling 08/07/2015    Family History  Problem Relation Age of Onset  . Pancreatic cancer Father   . Breast cancer Maternal Grandmother     Social History   Socioeconomic History  . Marital status: Single    Spouse name: Not on file  . Number of children: 0  . Years of education: Not on file  . Highest education level: Not on file  Occupational History  . Occupation: retired  Tobacco Use  . Smoking status: Never Smoker  . Smokeless tobacco: Current User    Types: Snuff  Vaping Use  . Vaping Use:  Never used  Substance and Sexual Activity  . Alcohol use: Not Currently    Alcohol/week: 0.0 standard drinks  . Drug use: Yes    Types: Marijuana  . Sexual activity: Not Currently  Other Topics Concern  . Not on file  Social History Narrative  . Not on file   Social Determinants of Health   Financial Resource Strain:   . Difficulty of Paying Living Expenses:   Food Insecurity:   . Worried About Charity fundraiser in the Last Year:   . Arboriculturist in  the Last Year:   Transportation Needs:   . Film/video editor (Medical):   Marland Kitchen Lack of Transportation (Non-Medical):   Physical Activity:   . Days of Exercise per Week:   . Minutes of Exercise per Session:   Stress:   . Feeling of Stress :   Social Connections:   . Frequency of Communication with Friends and Family:   . Frequency of Social Gatherings with Friends and Family:   . Attends Religious Services:   . Active Member of Clubs or Organizations:   . Attends Archivist Meetings:   Marland Kitchen Marital Status:   Intimate Partner Violence:   . Fear of Current or Ex-Partner:   . Emotionally Abused:   Marland Kitchen Physically Abused:   . Sexually Abused:     Review of Systems: Pertinent positive and negative review of systems were noted in the above HPI section.  All other review of systems was otherwise negative.  Physical Exam: Vital signs in last 24 hours: Temp:  [97.3 F (36.3 C)-97.7 F (36.5 C)] 97.3 F (36.3 C) (07/06 2224) Pulse Rate:  [95-109] 96 (07/07 1059) Resp:  [16-20] 19 (07/06 2224) BP: (114-167)/(67-80) 147/72 (07/07 1059) SpO2:  [96 %-100 %] 99 % (07/06 2224) Last BM Date: 09/27/19 General:   Alert,  Well-developed, well-nourished, older white female pleasant and cooperative in NAD coughing Head:  Normocephalic and atraumatic. Eyes:  Sclera clear, no icterus.   Conjunctiva pink. Ears:  Normal auditory acuity. Nose:  No deformity, discharge,  or lesions. Mouth:  No deformity or lesions.   Neck:   Supple; no masses or thyromegaly. Lungs: Scattered rhonchi bilaterally . Heart:  Regular rate and rhythm; no murmurs, clicks, rubs,  or gallops. Abdomen:  Soft, obese, no focal tenderness, no appreciable fluid wave post paracentesis, she does have abdominal wall edema across the panniculus Rectal:  Deferred  Msk:  Symmetrical without gross deformities. . Pulses:  Normal pulses noted. Extremities: 2+ edema bilaterally into the thighs Neurologic:  Alert and  oriented x4;  grossly normal neurologically.  No asterixis Skin:  Intact without significant lesions or rashes.. Psych:  Alert and cooperative. Normal mood and affect.  Intake/Output from previous day: 07/06 0701 - 07/07 0700 In: 122.9 [P.O.:60; I.V.:62.9] Out: 250 [Urine:250] Intake/Output this shift: No intake/output data recorded.  Lab Results: Recent Labs    09/27/19 1700 09/28/19 0434  WBC 8.1 5.4  HGB 12.9 11.6*  HCT 39.7 36.1  PLT 37* 28*   BMET Recent Labs    09/27/19 1700 09/28/19 0434  NA 135 134*  K 3.8 3.9  CL 98 100  CO2 25 25  GLUCOSE 129* 97  BUN 25* 27*  CREATININE 1.11* 1.04*  CALCIUM 8.8* 8.5*   LFT Recent Labs    09/28/19 0434  PROT 5.7*  ALBUMIN 2.9*  AST 62*  ALT 38  ALKPHOS 132*  BILITOT 3.9*   PT/INR Recent Labs    09/27/19 2025  LABPROT 16.1*  INR 1.3*   Hepatitis Panel No results for input(s): HEPBSAG, HCVAB, HEPAIGM, HEPBIGM in the last 72 hours.        IMPRESSION:  #35 54 year old white female admitted through the emergency room yesterday with complaints of progressive abdominal pain and distention as well as peripheral edema.  Symptoms onset initially about 4 months ago and much worse over the past few weeks. Work-up with CT revealed a large amount of ascites which is new, cirrhotic appearing liver and a distended loop of small bowel raising question  of focal partial small bowel obstruction.  Patient is known to have NASH/plus minus EtOH induced cirrhosis which had  been fairly compensated with meld around 10.  She has had severe ongoing thrombocytopenia out of proportion to degree of cirrhosis.  She has been followed by hematology/Dr. Janese Banks in [place] and most recent note relates work-up negative and the thrombocytopenia is probably secondary to cirrhosis.  Presentation currently consistent with anasarca secondary to decompensation of liver disease plus minus exacerbation with recent steroids. M ELD is 16  #2 renal insufficiency-probably AKI 3 status post right nephrectomy 3.  Obesity 4.  Anxiety 5.  Hypertension   PLAN: #1 await peritoneal fluid studies #2 start full liquid low-sodium diet #3 plain films in a.m. #4 start Lasix 40 mg IV daily #5 start Aldactone 100 mg p.o. daily #6 Daily weights, strict  I/O #7 follow renal function carefully #8 venous ammonia and AFP #9 will need eventual EGD for screening Thank you will follow with you   Jessica EsterwoodPA-C  09/28/2019, 11:29 AM  I have reviewed the entire case in detail with the above APP and discussed the plan in detail.  Therefore, I agree with the diagnoses recorded above. In addition,  I have personally interviewed and examined the patient and have personally reviewed any abdominal/pelvic CT scan images.  My additional thoughts are as follows:  In addition to above, patient's pleural fluid from this morning is not consistent with SBP.  There was a total protein level less than 3, but no albumin level.  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  Decompensated Nash related cirrhosis.  Challenging historian, limited health literacy but reports no alcohol for decades. Marked thrombocytopenia from portal hypertension, mild coagulopathy, no overt encephalopathy, no GI bleeding.  Got significant relief of abdominal distention and ascites from paracentesis of 8.3 L earlier today. She is concerned that she has not had improvement in  the edema yet, but I did my best to explain to her this will take longer.  Requires sodium restriction, diuretics and probable escalating doses as tolerated by renal function and electrolytes, and close outpatient follow-up.  Patient does not clinically have ileus or obstruction.  I think this is some nonspecific focal bowel dilatation related to the large degree of ascites.  AFP for additional hepatoma screening, outpatient EGD for variceal screening.  GI service will follow this patient tomorrow.  Total time 60 minutes, complex patient, extensive outpatient records and data review required. Nelida Meuse III [phone]

## 2019-09-28 NOTE — Progress Notes (Signed)
PROGRESS NOTE  Jessica Cline NID:[id number] DOB: 04-13-1965 DOA: 09/27/2019 PCP: [name] Gerome Apley, PA-C   LOS: 0 days   Brief Narrative / Interim history: 54 year old female with history of liver cirrhosis, thrombocytopenia, essential hypertension came into the hospital with chief complaint of fluid overload as well as abdominal pain, nausea.  She is being followed by gastroenterology as an outpatient is believed that she has liver disease due to combination of EtOH cirrhosis/[place] (tells me she quit alcohol into her mid 30s).  Has not had an EGD due to thrombocytopenia.  She is also seeing oncology as an outpatient.  Imaging on admission concerning for small bowel obstruction and general surgery was consulted  Subjective / 24h Interval events: Complains of abdominal discomfort, tells me she has had worsening swelling in her legs.  She is not on any longstanding furosemide but has been taking intermittently at home.  Assessment & Plan: Principal Problem Small bowel obstruction-clinically seems to be a partial small bowel obstruction versus ileus, general surgery following.  Get ultrasound paracentesis this morning see if this helps.  Likely need albumin depending on how much fluid to remove  Active Problems Decompensated liver cirrhosis with ascites, splenomegaly, thrombocytopenia-since she has not been on diuretics at home I have consulted gastroenterology due to significant fluid overload.  Monitor blood pressure, renal function of the paracentesis, likely needs some form of diuresis  On chronic "as needed" DDAVP-no formal GI diagnosis but this is a chronic medication.  Sodium normal, monitor  Scheduled Meds: . amLODipine  10 mg Oral Daily  . furosemide  20 mg Oral Daily  . lidocaine      . sertraline  200 mg Oral Daily  . traZODone  200 mg Oral QHS   Continuous Infusions: . sodium chloride Stopped (09/27/19 2059)   PRN Meds:.desmopressin, ipratropium-albuterol, ondansetron **OR**  ondansetron (ZOFRAN) IV  Diet Orders (From admission, onward)    Start     Ordered   09/27/19 2046  Diet NPO time specified Except for: Ice Chips, Sips with Meds  Diet effective now       Question Answer Comment  Except for Ice Chips   Except for Sips with Meds      09/27/19 2045          DVT prophylaxis: SCDs Start: 09/27/19 2046     Code Status: Full Code  Family Communication: no family at bedside   Status is: Observation  The patient will require care spanning > 2 midnights and should be moved to inpatient because: Significant fluid overload, persistent bowel obstruction, general surgery and GI following  Dispo: The patient is from: Home              Anticipated d/c is to: Home              Anticipated d/c date is: 3 days              Patient currently is not medically stable to d/c.  Consultants:  General surgery  GI  Procedures:  Paracentesis 7/7  Microbiology  none  Antimicrobials: none    Objective: Vitals:   09/27/19 2031 09/27/19 2224 09/28/19 1010 09/28/19 1059  BP: 132/67 (!) 158/72 114/78 (!) 147/72  Pulse: (!) 105 (!) 102 95 96  Resp: 16 19    Temp:  (!) 97.3 F (36.3 C)    TempSrc:  Oral    SpO2: 96% 99%      Intake/Output Summary (Last 24 hours) at 09/28/2019 1116 Last  data filed at 09/28/2019 0540 Gross per 24 hour  Intake 122.89 ml  Output 250 ml  Net -127.11 ml   There were no vitals filed for this visit.  Examination:  Constitutional: NAD Eyes: no scleral icterus ENMT: Mucous membranes are moist.  Neck: normal, supple Respiratory: clear to auscultation bilaterally, no wheezing, no crackles. Normal respiratory effort. No accessory muscle use.  Cardiovascular: Regular rate and rhythm, no murmurs / rubs / gallops. 2+ pitting LE edema Abdomen: + distended, no tenderness. Bowel sounds positive.  Musculoskeletal: no clubbing / cyanosis.  Skin: no rashes Neurologic: non focal   Data Reviewed: I have independently reviewed  following labs and imaging studies   CBC: Recent Labs  Lab 09/27/19 1700 09/28/19 0434  WBC 8.1 5.4  NEUTROABS 6.3  --   HGB 12.9 11.6*  HCT 39.7 36.1  MCV 92.8 95.3  PLT 37* 28*   Basic Metabolic Panel: Recent Labs  Lab 09/27/19 1700 09/28/19 0434  NA 135 134*  K 3.8 3.9  CL 98 100  CO2 25 25  GLUCOSE 129* 97  BUN 25* 27*  CREATININE 1.11* 1.04*  CALCIUM 8.8* 8.5*   Liver Function Tests: Recent Labs  Lab 09/27/19 1700 09/28/19 0434  AST 88* 62*  ALT 47* 38  ALKPHOS 166* 132*  BILITOT 5.0* 3.9*  PROT 6.6 5.7*  ALBUMIN 3.5 2.9*   Coagulation Profile: Recent Labs  Lab 09/27/19 2025  INR 1.3*   HbA1C: No results for input(s): HGBA1C in the last 72 hours. CBG: No results for input(s): GLUCAP in the last 168 hours.  Recent Results (from the past 240 hour(s))  SARS Coronavirus 2 by RT PCR (hospital order, performed in [facility] hospital lab) Nasopharyngeal Nasopharyngeal Swab     Status: None   Collection Time: 09/27/19  8:30 PM   Specimen: Nasopharyngeal Swab  Result Value Ref Range Status   SARS Coronavirus 2 NEGATIVE NEGATIVE Final    Comment: (NOTE) SARS-CoV-2 target nucleic acids are NOT DETECTED.  The SARS-CoV-2 RNA is generally detectable in upper and lower respiratory specimens during the acute phase of infection. The lowest concentration of SARS-CoV-2 viral copies this assay can detect is 250 copies / mL. A negative result does not preclude SARS-CoV-2 infection and should not be used as the sole basis for treatment or other patient management decisions.  A negative result may occur with improper specimen collection / handling, submission of specimen other than nasopharyngeal swab, presence of viral mutation(s) within the areas targeted by this assay, and inadequate number of viral copies (<250 copies / mL). A negative result must be combined with clinical observations, patient history, and epidemiological information.  Fact Sheet for  Patients:   BoilerBrush.com.cy  Fact Sheet for Healthcare Providers: https://pope.com/  This test is not yet approved or  cleared by the [place] FDA and has been authorized for detection and/or diagnosis of SARS-CoV-2 by FDA under an Emergency Use Authorization (EUA).  This EUA will remain in effect (meaning this test can be used) for the duration of the COVID-19 declaration under Section 564(b)(1) of the Act, 21 U.S.C. section 360bbb-3(b)(1), unless the authorization is terminated or revoked sooner.  Performed at [facility], [address] [place], [place] [address]      Radiology Studies: CT Abdomen Pelvis W Contrast  Result Date: 09/27/2019 CLINICAL DATA:  Abdominal distension and low platelets. EXAM: CT ABDOMEN AND PELVIS WITH CONTRAST TECHNIQUE: Multidetector CT imaging of the abdomen and pelvis was performed using  the standard protocol following bolus administration of intravenous contrast. CONTRAST:  OMNIPAQUE IOHEXOL 300 MG/ML  SOLN COMPARISON:  None. FINDINGS: Lower chest: No acute abnormality. Hepatobiliary: The liver is cirrhotic in appearance. Numerous tiny gallstones are seen within the lumen of a normal appearing gallbladder. Pancreas: Unremarkable. No pancreatic ductal dilatation or surrounding inflammatory changes. Spleen: There is mild to moderate severity splenomegaly. Adrenals/Urinary Tract: Adrenal glands are unremarkable. Kidneys are normal, without renal calculi, focal lesion, or hydronephrosis. Bladder is unremarkable. Stomach/Bowel: Stomach is within normal limits. The appendix is not clearly identified. A dilated loop of mid to distal small bowel is seen within the mid abdomen (maximum small bowel diameter of approximately 4.4 cm). A gradual transition zone is seen along the anterior medial aspect of the mid abdomen. Vascular/Lymphatic: No significant vascular findings are present. No  enlarged abdominal or pelvic lymph nodes. Reproductive: Uterus and bilateral adnexa are unremarkable. Other: No abdominal wall hernia or abnormality. A large amount of nonhemorrhagic abdominal and pelvic free fluid is noted (approximately 12.16 Hounsfield units). Musculoskeletal: Moderate to marked severity anasarca is seen throughout the abdominal and pelvic walls. Multiple chronic posterolateral right-sided rib fractures are seen. Degenerative changes seen within the lumbar spine. IMPRESSION: 1. Findings consistent with a proximal small bowel obstruction. 2. Large amount of nonhemorrhagic abdominal and pelvic free fluid. 3. Cirrhosis with splenomegaly. 4. Cholelithiasis. Electronically Signed   By: Aram Candela M.D.   On: 09/27/2019 19:00   DG Chest Port 1 View  Result Date: 09/27/2019 CLINICAL DATA:  Shortness of breath EXAM: PORTABLE CHEST 1 VIEW COMPARISON:  None. FINDINGS: Multiple old right-sided rib fractures. No focal opacity or pleural effusion. Normal heart size. No pneumothorax. IMPRESSION: No active disease. Electronically Signed   By: Jasmine Pang M.D.   On: 09/27/2019 17:16    Pamella Pert, MD, PhD [facility]  Between 7 am - 7 pm I am available, please contact me via Amion or Securechat  Between 7 pm - 7 am I am not available, please contact night coverage MD/APP via Amion

## 2019-09-28 NOTE — TOC Initial Note (Signed)
Transition of Care [facility]) - Initial/Assessment Note    Patient Details  Name: Jessica Cline MRN: [id number] Date of Birth: 11-Mar-1966  [facility] of [facility]) CM/SW Contact:    Lennart Pall, LCSW Phone Number: 09/28/2019, 3:28 PM  Clinical Narrative:  Met with pt to introduce TOC role and review dc supports.  Pt reports that she lives alone but does have a few neighbors who she calls on prn to assist.  She has been driving and living independently.  Frustrated that she feels she has been "trying to figure out all what's wrong with me for a long time."  She feels safe returning home and notes she may ask a neighbor to stay with her in the evenings.  May benefit from PT eval to determine if home alone is a safe plan.  TOC will continue to follow.                Expected Discharge Plan: [place] (vs. home with self care) Barriers to Discharge: Continued Medical Work up   Patient Goals and CMS Choice Patient states their goals for this hospitalization and ongoing recovery are:: go home      Expected Discharge Plan and Services Expected Discharge Plan: [place] (vs. home with self care) In-house Referral: Clinical Social Work     Living arrangements for the past 2 months: [place]                                      Prior Living Arrangements/Services Living arrangements for the past 2 months: Single Family Home Lives with:: Self Patient language and need for interpreter reviewed:: Yes Do you feel safe going back to the place where you live?: Yes      Need for Family Participation in Patient Care: No (Comment) Care giver support system in place?: No (comment)   Criminal Activity/Legal Involvement Pertinent to Current Situation/Hospitalization: No - Comment as needed  Activities of Daily Living Home Assistive Devices/Equipment: Environmental consultant (specify type), Eyeglasses (reading glasses) ADL Screening (condition at time of  admission) Patient's cognitive ability adequate to safely complete daily activities?: Yes Is the patient deaf or have difficulty hearing?: No Does the patient have difficulty seeing, even when wearing glasses/contacts?: No Does the patient have difficulty concentrating, remembering, or making decisions?: Yes Patient able to express need for assistance with ADLs?: Yes Does the patient have difficulty dressing or bathing?: Yes Independently performs ADLs?: No Communication: Independent Dressing (OT): Needs assistance Is this a change from baseline?: Pre-admission baseline Grooming: Independent Feeding: Independent Bathing: Needs assistance Is this a change from baseline?: Pre-admission baseline Toileting: Needs assistance Is this a change from baseline?: Pre-admission baseline In/Out Bed: Needs assistance Is this a change from baseline?: Pre-admission baseline Walks in Home: Needs assistance Is this a change from baseline?: Pre-admission baseline Does the patient have difficulty walking or climbing stairs?: Yes Weakness of Legs: Both Weakness of Arms/Hands: Both  Permission Sought/Granted                  Emotional Assessment Appearance:: Appears older than stated age Attitude/Demeanor/Rapport: Engaged Affect (typically observed): Frustrated, Accepting Orientation: : Oriented to Self, Oriented to Place, Oriented to  Time, Oriented to Situation Alcohol / Substance Use: Not Applicable Psych Involvement: No (comment)  Admission diagnosis:  Small bowel obstruction ([place]) [K56.609] SBO (small bowel obstruction) ([place]) [K56.609] Ascites [R18.8] Patient  Active Problem List   Diagnosis Date Noted  . SBO (small bowel obstruction) ([place]) 09/27/2019  . Splenomegaly 03/27/2016  . Cirrhosis ([place]) 08/07/2015  . Lupus anticoagulant positive 01/24/2015  . Allergic transfusion reaction 01/06/2015  . Thrombocytopenia ([place]) 01/05/2015  . Vaginal hemorrhage 01/05/2015  . Abnormal uterine  bleeding (AUB) 01/05/2015  . Alcoholic cirrhosis of liver with ascites ([place]) 01/05/2015  . 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 Liver lesion, right lobe 06/01/2014   PCP:  Driggers, Hewitt Shorts, PA-C Pharmacy:   [facility] [place] [address] Phone: [phone] Fax: [phone]     Social Determinants of Health (SDOH) Interventions    Readmission Risk Interventions Readmission Risk Prevention Plan 09/28/2019  Medication Screening Complete  Transportation Screening Complete  Some recent data might be hidden

## 2019-09-28 NOTE — Progress Notes (Signed)
    CC:  Abdominal pain  Subjective: Going down for her paracentesis now.  She says she has not had a BM since Friday.  She describes her BMs as dark in color and sometimes slimy.     Objective: Vital signs in last 24 hours: Temp:  [97.3 F (36.3 C)-97.7 F (36.5 C)] 97.3 F (36.3 C) (07/06 2224) Pulse Rate:  [102-109] 102 (07/06 2224) Resp:  [16-20] 19 (07/06 2224) BP: (121-167)/(67-80) 158/72 (07/06 2224) SpO2:  [96 %-100 %] 99 % (07/06 2224) Last BM Date: 09/27/19 60 PO 63 IV 250 urine No BM Afebrile, VSS HR 102 Creatinine 1.11>.1.04 T bil 3.9 WBC 5.4 H/H 11.6/36 Platelets 37>> 28 INR 1.3 CT scan 7/6: Findings consistent with a proximal small bowel obstruction.   Large amount of nonhemorrhagic abdominal and pelvic free fluid.  Cirrhosis with splenomegaly. Cholelithiasis.   Intake/Output from previous day: 07/06 0701 - 07/07 0700 In: 122.9 [P.O.:60; I.V.:62.9] Out: 250 [Urine:250] Intake/Output this shift: No intake/output data recorded.  General appearance: alert, cooperative and no distress GI: large abdomen with ascites, not really tender, + BS. Extremities: significant edema both lower extremities  Lab Results:  Recent Labs    09/27/19 1700 09/28/19 0434  WBC 8.1 5.4  HGB 12.9 11.6*  HCT 39.7 36.1  PLT 37* 28*    BMET Recent Labs    09/27/19 1700 09/28/19 0434  NA 135 134*  K 3.8 3.9  CL 98 100  CO2 25 25  GLUCOSE 129* 97  BUN 25* 27*  CREATININE 1.11* 1.04*  CALCIUM 8.8* 8.5*   PT/INR Recent Labs    09/27/19 2025  LABPROT 16.1*  INR 1.3*    Recent Labs  Lab 09/27/19 1700 09/28/19 0434  AST 88* 62*  ALT 47* 38  ALKPHOS 166* 132*  BILITOT 5.0* 3.9*  PROT 6.6 5.7*  ALBUMIN 3.5 2.9*     Lipase     Component Value Date/Time   LIPASE 37 09/27/2019 1700     Medications: . amLODipine  10 mg Oral Daily  . furosemide  20 mg Oral Daily  . sertraline  200 mg Oral Daily  . traZODone  200 mg Oral QHS   . sodium chloride  Stopped (09/27/19 2059)    Assessment/Plan Splenomegaly Lupus anticoagulant positive. Thrombocytopenia Chronic abdominal pain Hypertension Hx depression/anxiety  Cirrhosis with ascites Possible SBO Child's class C cirrhosis  FEN:  NPO ID:  None DVT:SCD's Follow up:  TBD    Plan:   Paracentesis and follow with you.      LOS: 0 days    Jessica Cline 09/28/2019 Please see Amion

## 2019-09-28 NOTE — Progress Notes (Signed)
Was in contact room when ED called to give report, could not answer phone. Called back to get report, no answer, will call back.

## 2019-09-28 NOTE — Procedures (Signed)
Ultrasound-guided diagnostic and therapeutic paracentesis performed yielding 8.3 liters of  yellow fluid. No immediate complications.  A portion of the fluid was submitted to the lab for preordered studies.  The patient will receive IV albumin infusion post procedure. EBL none.

## 2019-09-28 NOTE — Progress Notes (Signed)
Informed that patient has critical platelet level of 28. This is around patient's baseline,  MD on call notified. No new orders. Isabelle Course, RN

## 2019-09-28 NOTE — Progress Notes (Signed)
This is a late typed assessment that was done this morning at 0730, patient is less distended now after paracentesis

## 2019-09-29 ENCOUNTER — Inpatient Hospital Stay (HOSPITAL_COMMUNITY): Payer: Medicare Other

## 2019-09-29 DIAGNOSIS — K56609 Unspecified intestinal obstruction, unspecified as to partial versus complete obstruction: Secondary | ICD-10-CM

## 2019-09-29 DIAGNOSIS — K567 Ileus, unspecified: Secondary | ICD-10-CM

## 2019-09-29 LAB — COMPREHENSIVE METABOLIC PANEL
ALT: 34 U/L (ref 0–44)
AST: 61 U/L — ABNORMAL HIGH (ref 15–41)
Albumin: 2.7 g/dL — ABNORMAL LOW (ref 3.5–5.0)
Alkaline Phosphatase: 123 U/L (ref 38–126)
Anion gap: 11 (ref 5–15)
BUN: 26 mg/dL — ABNORMAL HIGH (ref 6–20)
CO2: 24 mmol/L (ref 22–32)
Calcium: 8.3 mg/dL — ABNORMAL LOW (ref 8.9–10.3)
Chloride: 99 mmol/L (ref 98–111)
Creatinine, Ser: 1.02 mg/dL — ABNORMAL HIGH (ref 0.44–1.00)
GFR calc Af Amer: >60 mL/min (ref >60)
GFR calc non Af Amer: >60 mL/min (ref >60)
Glucose, Bld: 79 mg/dL (ref 70–99)
Potassium: 3.8 mmol/L (ref 3.5–5.1)
Sodium: 134 mmol/L — ABNORMAL LOW (ref 135–145)
Total Bilirubin: 3.7 mg/dL — ABNORMAL HIGH (ref 0.3–1.2)
Total Protein: 5.2 g/dL — ABNORMAL LOW (ref 6.5–8.1)

## 2019-09-29 LAB — CBC
HCT: 35.1 % — ABNORMAL LOW (ref 36.0–46.0)
Hemoglobin: 11.2 g/dL — ABNORMAL LOW (ref 12.0–15.0)
MCH: 30.1 pg (ref 26.0–34.0)
MCHC: 31.9 g/dL (ref 30.0–36.0)
MCV: 94.4 fL (ref 80.0–100.0)
Platelets: 27 10*3/uL — CL (ref 150–400)
RBC: 3.72 MIL/uL — ABNORMAL LOW (ref 3.87–5.11)
RDW: 15.9 % — ABNORMAL HIGH (ref 11.5–15.5)
WBC: 5.7 10*3/uL (ref 4.0–10.5)
nRBC: 0 % (ref 0.0–0.2)

## 2019-09-29 LAB — CYTOLOGY - NON PAP

## 2019-09-29 MED ORDER — LACTULOSE 10 GM/15ML PO SOLN
10.0000 g | Freq: Three times a day (TID) | ORAL | Status: DC
  Administered 2019-09-29 – 2019-10-06 (×19): 10 g via ORAL
  Filled 2019-09-29 (×20): qty 15

## 2019-09-29 MED ORDER — RIFAXIMIN 550 MG PO TABS
550.0000 mg | ORAL_TABLET | Freq: Two times a day (BID) | ORAL | Status: DC
  Administered 2019-09-29 – 2019-10-06 (×14): 550 mg via ORAL
  Filled 2019-09-29 (×16): qty 1

## 2019-09-29 NOTE — Progress Notes (Signed)
    CC: Abdominal pain  Subjective: No real change from yesterday.  Having flatus, no BM since last week. It seems she at least has intermittent issues with her bowels.  She is currently mostly interested in getting her leg in better shape.    Objective: Vital signs in last 24 hours: Temp:  [97.6 F (36.4 C)-98.5 F (36.9 C)] 98.5 F (36.9 C) (07/08 0538) Pulse Rate:  [87-107] 97 (07/08 0538) Resp:  [16-18] 16 (07/08 0538) BP: (114-147)/(57-112) 123/57 (07/08 0538) SpO2:  [93 %-100 %] 93 % (07/08 0538) Weight:  [127.9 kg] 127.9 kg (07/07 1209) Last BM Date: 09/27/19 530 PO Urine 800 0 per NG/emesis No BM Afebrile, tachycardic, blood pressure stable.  Sats are good what appears to be a nasal cannula CMP is stable Total bilirubin 3.9>> 3.7 Ammonia 68 WBC 5.7 H/H 11.2/35 Platelets 27,000 Intake/Output from previous day: 07/07 0701 - 07/08 0700 In: 530 [P.O.:530] Out: 800 [Urine:800] Intake/Output this shift: No intake/output data recorded.  General appearance: alert, cooperative and no distress Resp: wheezes anterior - bilateral GI: large abdomen, rare hypoactive BS, some flatus, soft, non tender.   Lab Results:  Recent Labs    09/28/19 0434 09/29/19 0504  WBC 5.4 5.7  HGB 11.6* 11.2*  HCT 36.1 35.1*  PLT 28* 27*    BMET Recent Labs    09/28/19 0434 09/29/19 0504  NA 134* 134*  K 3.9 3.8  CL 100 99  CO2 25 24  GLUCOSE 97 79  BUN 27* 26*  CREATININE 1.04* 1.02*  CALCIUM 8.5* 8.3*   PT/INR Recent Labs    09/27/19 2025  LABPROT 16.1*  INR 1.3*    Recent Labs  Lab 09/27/19 1700 09/28/19 0434 09/29/19 0504  AST 88* 62* 61*  ALT 47* 38 34  ALKPHOS 166* 132* 123  BILITOT 5.0* 3.9* 3.7*  PROT 6.6 5.7* 5.2*  ALBUMIN 3.5 2.9* 2.7*     Lipase     Component Value Date/Time   LIPASE 37 09/27/2019 1700     Medications: . amLODipine  10 mg Oral Daily  . furosemide  40 mg Intravenous Daily  . sertraline  200 mg Oral Daily  .  spironolactone  100 mg Oral Daily  . traZODone  200 mg Oral QHS    Assessment/Plan Splenomegaly Lupus anticoagulant positive. Thrombocytopenia Chronic abdominal pain Hypertension Hx depression/anxiety  Cirrhosis with ascites Possible SBO Child's class C cirrhosis  -Paracentesis 8.3 L yellow fluid drained yesterday  -Abdominal film 7/8: Mildly prominent air-filled loops of small bowel consistent with     adynamic ileus  FEN:  NPO ID:  None DVT:SCD's Follow up:  TBD  Plan:  Findings consistent to adynamic ileus.  No surgical issue.  Will defer to Medicine and GI.  Please call if we can be of assistance.        LOS: 1 day    Ema Hebner 09/29/2019 Please see Amion

## 2019-09-29 NOTE — Progress Notes (Signed)
Patient ID: Jessica Cline, female   DOB: 1965/07/18, 54 y.o.   MRN: [id number]    Progress Note   Subjective   Day # 2  CC; abdominal pain, distention and extremity edema  No significant change in complaints, feels better after large volume paracentesis, able to tolerate p.o.'s   Creat 1.02   Tbili 3.7/ AST 61 HGB 11.2/plts 27 AFP pend Ammonia 68 Fluid cell counts- not c/w SBP  KUB today - Mildly prominent air-filled loops of small bowel most consistent with adynamic ileus      Objective   Vital signs in last 24 hours: Temp:  [97.7 F (36.5 C)-98.5 F (36.9 C)] 98.3 F (36.8 C) (07/08 0921) Pulse Rate:  [97-107] 97 (07/08 0921) Resp:  [16-18] 16 (07/08 0921) BP: (123-153)/(57-112) 153/67 (07/08 0921) SpO2:  [93 %-100 %] 96 % (07/08 0921) Last BM Date: 09/27/19 General:   Older 5 white female in NAD Heart:  Regular rate and rhythm; no murmurs Lungs: Respirations even and unlabored, lungs CTA bilaterally Abdomen:  Soft, no focal tenderness, no fluid wave and nondistended. Normal bowel sounds. Extremities: 2+ edema bilaterally above the knee. Neurologic:  Alert and oriented,  grossly normal neurologically.  No asterixis Psych:  Cooperative. Normal mood and affect.  Intake/Output from previous day: 07/07 0701 - 07/08 0700 In: 530 [P.O.:530] Out: 800 [Urine:800] Intake/Output this shift: Total I/O In: 0  Out: 300 [Urine:300]  Lab Results: Recent Labs    09/27/19 1700 09/28/19 0434 09/29/19 0504  WBC 8.1 5.4 5.7  HGB 12.9 11.6* 11.2*  HCT 39.7 36.1 35.1*  PLT 37* 28* 27*   BMET Recent Labs    09/27/19 1700 09/28/19 0434 09/29/19 0504  NA 135 134* 134*  K 3.8 3.9 3.8  CL 98 100 99  CO2 25 25 24   GLUCOSE 129* 97 79  BUN 25* 27* 26*  CREATININE 1.11* 1.04* 1.02*  CALCIUM 8.8* 8.5* 8.3*   LFT Recent Labs    09/29/19 0504  PROT 5.2*  ALBUMIN 2.7*  AST 61*  ALT 34  ALKPHOS 123  BILITOT 3.7*   PT/INR Recent Labs    09/27/19 2025  LABPROT  16.1*  INR 1.3*    Studies/Results: DG Abd 1 View  Result Date: 09/29/2019 CLINICAL DATA:  Ileus. EXAM: ABDOMEN - 1 VIEW COMPARISON:  CT 09/27/2019. FINDINGS: Mildly prominent air-filled loops of small and large bowel noted. Findings most consistent with adynamic ileus. No free air. Degenerative changes scoliosis thoracolumbar spine. No acute bony abnormality identified. IMPRESSION: Mildly prominent air-filled loops of small large bowel most consistent with adynamic ileus. Continued follow-up exams suggested to exclude bowel obstruction. Reference is made to CT report of 09/27/2019. Electronically Signed   By: 11/28/2019  Register   On: 09/29/2019 05:47   CT Abdomen Pelvis W Contrast  Result Date: 09/27/2019 CLINICAL DATA:  Abdominal distension and low platelets. EXAM: CT ABDOMEN AND PELVIS WITH CONTRAST TECHNIQUE: Multidetector CT imaging of the abdomen and pelvis was performed using the standard protocol following bolus administration of intravenous contrast. CONTRAST:  11/28/2019 OMNIPAQUE IOHEXOL 300 MG/ML  SOLN COMPARISON:  None. FINDINGS: Lower chest: No acute abnormality. Hepatobiliary: The liver is cirrhotic in appearance. Numerous tiny gallstones are seen within the lumen of a normal appearing gallbladder. Pancreas: Unremarkable. No pancreatic ductal dilatation or surrounding inflammatory changes. Spleen: There is mild to moderate severity splenomegaly. Adrenals/Urinary Tract: Adrenal glands are unremarkable. Kidneys are normal, without renal calculi, focal lesion, or hydronephrosis. Bladder is unremarkable. Stomach/Bowel: Stomach is  within normal limits. The appendix is not clearly identified. A dilated loop of mid to distal small bowel is seen within the mid abdomen (maximum small bowel diameter of approximately 4.4 cm). A gradual transition zone is seen along the anterior medial aspect of the mid abdomen. Vascular/Lymphatic: No significant vascular findings are present. No enlarged abdominal or pelvic  lymph nodes. Reproductive: Uterus and bilateral adnexa are unremarkable. Other: No abdominal wall hernia or abnormality. A large amount of nonhemorrhagic abdominal and pelvic free fluid is noted (approximately 12.16 Hounsfield units). Musculoskeletal: Moderate to marked severity anasarca is seen throughout the abdominal and pelvic walls. Multiple chronic posterolateral right-sided rib fractures are seen. Degenerative changes seen within the lumbar spine. IMPRESSION: 1. Findings consistent with a proximal small bowel obstruction. 2. Large amount of nonhemorrhagic abdominal and pelvic free fluid. 3. Cirrhosis with splenomegaly. 4. Cholelithiasis. Electronically Signed   By: Aram Candela M.D.   On: 09/27/2019 19:00   US Paracentesis  Result Date: 09/28/2019 INDICATION: Patient with history of cirrhosis, thrombocytopenia, hypertension, small-bowel obstruction, splenomegaly, ascites; request received for diagnostic and therapeutic paracentesis. EXAM: ULTRASOUND GUIDED DIAGNOSTIC AND THERAPEUTIC PARACENTESIS MEDICATIONS: None COMPLICATIONS: None immediate. PROCEDURE: Informed written consent was obtained from the patient after a discussion of the risks, benefits and alternatives to treatment. A timeout was performed prior to the initiation of the procedure. Initial ultrasound scanning demonstrates a large amount of ascites within the left lower abdominal quadrant. The left lower abdomen was prepped and draped in the usual sterile fashion. 1% lidocaine was used for local anesthesia. Following this, a 19 gauge, 10-cm, Yueh catheter was introduced. An ultrasound image was saved for documentation purposes. The paracentesis was performed. The catheter was removed and a dressing was applied. The patient tolerated the procedure well without immediate post procedural complication. Patient received post-procedure intravenous albumin; see nursing notes for details. FINDINGS: A total of approximately 8.3 liters of yellow  fluid was removed. Samples were sent to the laboratory as requested by the clinical team. IMPRESSION: Successful ultrasound-guided diagnostic and therapeutic paracentesis yielding 8.3 liters of peritoneal fluid. Read by: Jeananne Rama, PA-C Electronically Signed   By: Corlis Leak M.D.   On: 09/28/2019 12:40   DG Chest Port 1 View  Result Date: 09/27/2019 CLINICAL DATA:  Shortness of breath EXAM: PORTABLE CHEST 1 VIEW COMPARISON:  None. FINDINGS: Multiple old right-sided rib fractures. No focal opacity or pleural effusion. Normal heart size. No pneumothorax. IMPRESSION: No active disease. Electronically Signed   By: Jasmine Pang M.D.   On: 09/27/2019 17:16       Assessment / Plan:    #80 54 year old white female admitted with progressive abdominal pain, distention and peripheral edema.  Gradual onset of symptoms over the prior 4 months. CT showed a large amount of ascites which is new, cirrhotic appearing liver and a distended loop of small bowel wheezing question of focal small bowel obstruction. Patient with known Nash/EtOH induced cirrhosis which had been fairly compensated Current M ELD of 16  Abdominal films today show no evidence of bowel obstruction Symptomatically improved post paracentesis of 8 L yesterday, no evidence for SBP Initiated diuresis yesterday  #2 chronic severe thrombocytopenia-stable #3 acute kidney injury improved #4 status post partial right nephrectomy #5 morbid obesity #6 no significant hepatic encephalopathy, ammonia elevated and patient has shown further decompensation therefore plan to continue low-dose lactulose and add Xifaxan 550 twice daily  Plan; continue 2 g sodium diet, advance to solid today Continue Lasix 40 mg IV and Aldactone  100 mg p.o. daily Continue to follow renal function carefully. Will plan EGD as an outpatient Continue lactulose 10 g 3 times daily Add Xifaxan 550 twice daily   Principal Problem:   SBO (small bowel obstruction)  (HCC) Active Problems:   Thrombocytopenia (HCC)   Alcoholic cirrhosis of liver with ascites (HCC)     LOS: 1 day   Shawnell Dykes PA-C 09/29/2019, 12:50 PM

## 2019-09-29 NOTE — Progress Notes (Signed)
PROGRESS NOTE  Jessica Cline QMV:[id number] DOB: 1965-07-07 DOA: 09/27/2019 PCP: [name] Gerome Apley, PA-C   LOS: 1 day   Brief Narrative / Interim history: 54 year old female with history of liver cirrhosis, thrombocytopenia, essential hypertension came into the hospital with chief complaint of fluid overload as well as abdominal pain, nausea.  She is being followed by gastroenterology as an outpatient is believed that she has liver disease due to combination of EtOH cirrhosis/Nash (tells me she quit alcohol into her mid 30s).  Has not had an EGD due to thrombocytopenia.  She is also seeing oncology as an outpatient.  Imaging on admission concerning for small bowel obstruction and general surgery was consulted  Subjective / 24h Interval events: Still has lower extremity swelling today  Assessment & Plan: Principal Problem Small bowel obstruction /adynamic ileus-clinically seems to be a partial small bowel obstruction versus ileus, general surgery following.  This is likely in the setting of significant ascites, improving.  Active Problems Decompensated liver cirrhosis with ascites, splenomegaly, thrombocytopenia-since she has not been on diuretics at home I have consulted gastroenterology due to significant fluid overload.  She was started on IV diuresis with furosemide along with spironolactone.  Renal function stable today, continue same regimen.  She was also started on lactulose along with rifaximin.  Essential hypertension-continue Norvasc, blood pressure stable, avoid hypotension  Morbid obesity, based on BMI greater than 40, she does have a degree of fluid overload but clearly has underlying obesity, she would benefit from weight loss  On chronic "as needed" DDAVP-no formal GI diagnosis but this is a chronic medication.  Sodium normal, monitor  Scheduled Meds:  amLODipine  10 mg Oral Daily   furosemide  40 mg Intravenous Daily   lactulose  10 g Oral TID   rifaximin  550 mg Oral  BID   sertraline  200 mg Oral Daily   spironolactone  100 mg Oral Daily   traZODone  200 mg Oral QHS   Continuous Infusions:  sodium chloride Stopped (09/27/19 2059)   PRN Meds:.desmopressin, ipratropium-albuterol, ondansetron **OR** ondansetron (ZOFRAN) IV  Diet Orders (From admission, onward)    Start     Ordered   09/28/19 1258  Diet full liquid Room service appropriate? Yes; Fluid consistency: Thin  Diet effective now       Comments: 2 gram sodium  Question Answer Comment  Room service appropriate? Yes   Fluid consistency: Thin      09/28/19 1300          DVT prophylaxis: SCDs Start: 09/27/19 2046     Code Status: Full Code  Family Communication: no family at bedside   Status is: Inpatient  Remains inpatient: Significant fluid overload, persistent bowel obstruction, general surgery and GI following  Dispo: The patient is from: Home              Anticipated d/c is to: Home              Anticipated d/c date is: 3 days              Patient currently is not medically stable to d/c.  Consultants:  General surgery  GI  Procedures:  Paracentesis 7/7  Microbiology  none  Antimicrobials: none    Objective: Vitals:   09/29/19 0218 09/29/19 0538 09/29/19 0921 09/29/19 1257  BP: (!) 128/57 (!) 123/57 (!) 153/67 (!) 148/68  Pulse: (!) 101 97 97 (!) 102  Resp: 17 16 16 17   Temp: 98.4 F (36.9 C)  98.5 F (36.9 C) 98.3 F (36.8 C) 98.2 F (36.8 C)  TempSrc: Oral Oral Oral Oral  SpO2: 97% 93% 96% 92%  Weight:      Height:        Intake/Output Summary (Last 24 hours) at 09/29/2019 1313 Last data filed at 09/29/2019 2263 Gross per 24 hour  Intake 530 ml  Output 1100 ml  Net -570 ml   Filed Weights   09/28/19 1209  Weight: 127.9 kg    Examination:  Constitutional: No distress Eyes: No scleral icterus ENMT: Moist mucous membranes Neck: normal, supple Respiratory: Diminished at the bases but overall clear, no wheezing or crackles, normal  respiratory effort Cardiovascular: Regular rate and rhythm, no murmurs, 2+ pitting edema Abdomen: Obese, distended, mildly tender without guarding or rebound Musculoskeletal: no clubbing / cyanosis.  Skin: No rashes seen Neurologic: No focal deficits  Data Reviewed: I have independently reviewed following labs and imaging studies   CBC: Recent Labs  Lab 09/27/19 1700 09/28/19 0434 09/29/19 0504  WBC 8.1 5.4 5.7  NEUTROABS 6.3  --   --   HGB 12.9 11.6* 11.2*  HCT 39.7 36.1 35.1*  MCV 92.8 95.3 94.4  PLT 37* 28* 27*   Basic Metabolic Panel: Recent Labs  Lab 09/27/19 1700 09/28/19 0434 09/29/19 0504  NA 135 134* 134*  K 3.8 3.9 3.8  CL 98 100 99  CO2 25 25 24   GLUCOSE 129* 97 79  BUN 25* 27* 26*  CREATININE 1.11* 1.04* 1.02*  CALCIUM 8.8* 8.5* 8.3*   Liver Function Tests: Recent Labs  Lab 09/27/19 1700 09/28/19 0434 09/29/19 0504  AST 88* 62* 61*  ALT 47* 38 34  ALKPHOS 166* 132* 123  BILITOT 5.0* 3.9* 3.7*  PROT 6.6 5.7* 5.2*  ALBUMIN 3.5 2.9* 2.7*   Coagulation Profile: Recent Labs  Lab 09/27/19 2025  INR 1.3*   HbA1C: No results for input(s): HGBA1C in the last 72 hours. CBG: No results for input(s): GLUCAP in the last 168 hours.  Recent Results (from the past 240 hour(s))  SARS Coronavirus 2 by RT PCR (hospital order, performed in [facility] hospital lab) Nasopharyngeal Nasopharyngeal Swab     Status: None   Collection Time: 09/27/19  8:30 PM   Specimen: Nasopharyngeal Swab  Result Value Ref Range Status   SARS Coronavirus 2 NEGATIVE NEGATIVE Final    Comment: (NOTE) SARS-CoV-2 target nucleic acids are NOT DETECTED.  The SARS-CoV-2 RNA is generally detectable in upper and lower respiratory specimens during the acute phase of infection. The lowest concentration of SARS-CoV-2 viral copies this assay can detect is 250 copies / mL. A negative result does not preclude SARS-CoV-2 infection and should not be used as the sole basis for treatment or  other patient management decisions.  A negative result may occur with improper specimen collection / handling, submission of specimen other than nasopharyngeal swab, presence of viral mutation(s) within the areas targeted by this assay, and inadequate number of viral copies (<250 copies / mL). A negative result must be combined with clinical observations, patient history, and epidemiological information.  Fact Sheet for Patients:   11/28/19  Fact Sheet for Healthcare Providers: BoilerBrush.com.cy  This test is not yet approved or  cleared by the https://pope.com/ FDA and has been authorized for detection and/or diagnosis of SARS-CoV-2 by FDA under an Emergency Use Authorization (EUA).  This EUA will remain in effect (meaning this test can be used) for the duration of the COVID-19 declaration under Section  564(b)(1) of the Act, 21 U.S.C. section 360bbb-3(b)(1), unless the authorization is terminated or revoked sooner.  Performed at [facility], [address], [place], [place] [address]   Body fluid culture     Status: None (Preliminary result)   Collection Time: 09/28/19 10:55 AM   Specimen: PATH Cytology Peritoneal fluid  Result Value Ref Range Status   Specimen Description   Final    CYTO PERI Performed at [facility], [address] [place], [place] [address]    Special Requests   Final    NONE Performed at [facility], [address], [place], [place] [address]    Gram Stain   Final    RARE WBC PRESENT, PREDOMINANTLY MONONUCLEAR NO ORGANISMS SEEN    Culture   Final    NO GROWTH < 24 HOURS Performed at [facility], [address], [place], [place] [address]    Report Status PENDING  Incomplete     Radiology Studies: DG Abd 1 View  Result Date: 09/29/2019 CLINICAL DATA:  Ileus. EXAM: ABDOMEN - 1 VIEW COMPARISON:  CT 09/27/2019. FINDINGS: Mildly  prominent air-filled loops of small and large bowel noted. Findings most consistent with adynamic ileus. No free air. Degenerative changes scoliosis thoracolumbar spine. No acute bony abnormality identified. IMPRESSION: Mildly prominent air-filled loops of small large bowel most consistent with adynamic ileus. Continued follow-up exams suggested to exclude bowel obstruction. Reference is made to CT report of 09/27/2019. Electronically Signed   By: Maisie Fus  Register   On: 09/29/2019 05:47    Pamella Pert, MD, PhD [facility]  Between 7 am - 7 pm I am available, please contact me via Amion or Securechat  Between 7 pm - 7 am I am not available, please contact night coverage MD/APP via Amion

## 2019-09-30 DIAGNOSIS — K7031 Alcoholic cirrhosis of liver with ascites: Principal | ICD-10-CM

## 2019-09-30 DIAGNOSIS — R601 Generalized edema: Secondary | ICD-10-CM

## 2019-09-30 LAB — COMPREHENSIVE METABOLIC PANEL
ALT: 32 U/L (ref 0–44)
AST: 60 U/L — ABNORMAL HIGH (ref 15–41)
Albumin: 2.7 g/dL — ABNORMAL LOW (ref 3.5–5.0)
Alkaline Phosphatase: 122 U/L (ref 38–126)
Anion gap: 9 (ref 5–15)
BUN: 27 mg/dL — ABNORMAL HIGH (ref 6–20)
CO2: 24 mmol/L (ref 22–32)
Calcium: 8.3 mg/dL — ABNORMAL LOW (ref 8.9–10.3)
Chloride: 100 mmol/L (ref 98–111)
Creatinine, Ser: 0.91 mg/dL (ref 0.44–1.00)
GFR calc Af Amer: >60 mL/min (ref >60)
GFR calc non Af Amer: >60 mL/min (ref >60)
Glucose, Bld: 103 mg/dL — ABNORMAL HIGH (ref 70–99)
Potassium: 3.7 mmol/L (ref 3.5–5.1)
Sodium: 133 mmol/L — ABNORMAL LOW (ref 135–145)
Total Bilirubin: 3.8 mg/dL — ABNORMAL HIGH (ref 0.3–1.2)
Total Protein: 5.3 g/dL — ABNORMAL LOW (ref 6.5–8.1)

## 2019-09-30 LAB — MAGNESIUM: Magnesium: 1.8 mg/dL (ref 1.7–2.4)

## 2019-09-30 LAB — PROTIME-INR
INR: 1.4 — ABNORMAL HIGH (ref 0.8–1.2)
Prothrombin Time: 16.7 seconds — ABNORMAL HIGH (ref 11.4–15.2)

## 2019-09-30 LAB — PHOSPHORUS: Phosphorus: 3.1 mg/dL (ref 2.5–4.6)

## 2019-09-30 LAB — PH, BODY FLUID: pH, Body Fluid: 7.5

## 2019-09-30 LAB — CBC
HCT: 33.6 % — ABNORMAL LOW (ref 36.0–46.0)
Hemoglobin: 11.2 g/dL — ABNORMAL LOW (ref 12.0–15.0)
MCH: 31 pg (ref 26.0–34.0)
MCHC: 33.3 g/dL (ref 30.0–36.0)
MCV: 93.1 fL (ref 80.0–100.0)
Platelets: 24 10*3/uL — CL (ref 150–400)
RBC: 3.61 MIL/uL — ABNORMAL LOW (ref 3.87–5.11)
RDW: 15.9 % — ABNORMAL HIGH (ref 11.5–15.5)
WBC: 5 10*3/uL (ref 4.0–10.5)
nRBC: 0 % (ref 0.0–0.2)

## 2019-09-30 LAB — AFP TUMOR MARKER: AFP, Serum, Tumor Marker: 2.7 ng/mL (ref 0.0–8.3)

## 2019-09-30 LAB — ALBUMIN, PLEURAL OR PERITONEAL FLUID: Albumin, Fluid: <1 g/dL

## 2019-09-30 MED ORDER — FUROSEMIDE 10 MG/ML IJ SOLN
40.0000 mg | Freq: Two times a day (BID) | INTRAMUSCULAR | Status: DC
  Administered 2019-09-30 – 2019-10-03 (×6): 40 mg via INTRAVENOUS
  Filled 2019-09-30 (×6): qty 4

## 2019-09-30 MED ORDER — ALBUMIN HUMAN 25 % IV SOLN
25.0000 g | Freq: Once | INTRAVENOUS | Status: AC
  Administered 2019-09-30: 25 g via INTRAVENOUS
  Filled 2019-09-30: qty 100

## 2019-09-30 NOTE — Progress Notes (Signed)
Ultrasound called to notify RN that patient has to be NPO after midnight.

## 2019-09-30 NOTE — Progress Notes (Signed)
PROGRESS NOTE  Jessica Cline:[id number] DOB: [birth year] DOA: 09/27/2019 PCP: [name] Gerome Apley, PA-C   LOS: 2 days   Brief Narrative / Interim history: 54 year old female with history of liver cirrhosis, thrombocytopenia, essential hypertension came into the hospital with chief complaint of fluid overload as well as abdominal pain, nausea.  She is being followed by gastroenterology as an outpatient is believed that she has liver disease due to combination of EtOH cirrhosis/Nash (tells me she quit alcohol into her mid 30s).  Has not had an EGD due to thrombocytopenia.  She is also seeing oncology as an outpatient.  Imaging on admission concerning for small bowel obstruction and general surgery was consulted  Subjective / 24h Interval events: Complains of her abdomen, she feels like it is actually getting filled with fluid again.  She denies any shortness of breath.  Does not appreciate any improvement in her leg swelling  Assessment & Plan: Principal Problem Small bowel obstruction /adynamic ileus-this is likely in the setting of significant ascites, improving.  General surgery was initially consulted, signed off on 7/8.  Active Problems Decompensated liver cirrhosis with ascites, splenomegaly, thrombocytopenia-new onset of ascites and significant fluid overload.  Gastroenterology consulted and following, discussed with GI team this morning, she does not seem to be responding to current dose of Lasix and will double the dose.  She actually appears to have gained some weight in the last couple of days.  We will augment diuresis with albumin.  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  She is not having significant increase in her urinary output off DDAVP, sodium is stable and she is in fact slightly hyponatremic in the setting of fluid overload, will discontinue DDAVP for now as she does not fit clinical criteria   Scheduled Meds: . furosemide  40 mg Intravenous BID  . lactulose  10 g Oral TID  . rifaximin  550 mg Oral BID  . sertraline  200 mg Oral Daily  . spironolactone  100 mg Oral Daily  . traZODone  200 mg Oral QHS   Continuous Infusions: . sodium chloride Stopped (09/27/19 2059)  . albumin human     PRN Meds:.ipratropium-albuterol, ondansetron **OR** ondansetron (ZOFRAN) IV  Diet Orders (From admission, onward)    Start     Ordered   09/28/19 1258  Diet full liquid Room service appropriate? Yes; Fluid consistency: Thin  Diet effective now       Comments: 2 gram sodium  Question Answer Comment  Room service appropriate? Yes   Fluid consistency: Thin      09/28/19 1300          DVT prophylaxis: SCDs Start: 09/27/19 2046     Code Status: Full Code  Family Communication: no family at bedside   Status is: Inpatient  Remains inpatient: Significant fluid overload, persistent  Dispo: The patient is from: Home              Anticipated d/c is to: Home              Anticipated d/c date is: 3 days  Patient currently is not medically stable to d/c.  Consultants:  General surgery  GI  Procedures:  Paracentesis 7/7  Microbiology  none  Antimicrobials: none    Objective: Vitals:   09/30/19 0130 09/30/19 0528 09/30/19 0921 09/30/19 1038  BP: 129/62 120/60 129/63   Pulse: 91 89 90   Resp: 16 18 17    Temp: 97.9 F (36.6 C) 97.6 F (36.4 C) 97.6 F (36.4 C)   TempSrc: Oral Oral Oral   SpO2: 94% 93% 97%   Weight:    132.6 kg  Height:        Intake/Output Summary (Last 24 hours) at 09/30/2019 1103 Last data filed at 09/30/2019  12/01/2019 Gross per 24 hour  Intake 720 ml  Output 0 ml  Net 720 ml   Filed Weights   09/28/19 1209 09/30/19 1038  Weight: 127.9 kg 132.6 kg    Examination:  Constitutional: No distress, in bed Eyes: No icterus seen ENMT: Moist mucous membranes Neck: normal, supple Respiratory: No wheezing, diminished at the bases Cardiovascular: Regular rate and rhythm, no murmurs, 2+ pitting edema all the way up to mid thighs, anasarca Abdomen: Obese, distended, no guarding or rebound, bowel sounds positive Musculoskeletal: no clubbing / cyanosis.  Skin: No rashes seen Neurologic: Nonfocal, equal strength  Data Reviewed: I have independently reviewed following labs and imaging studies   CBC: Recent Labs  Lab 09/27/19 1700 09/28/19 0434 09/29/19 0504 09/30/19 0436  WBC 8.1 5.4 5.7 5.0  NEUTROABS 6.3  --   --   --   HGB 12.9 11.6* 11.2* 11.2*  HCT 39.7 36.1 35.1* 33.6*  MCV 92.8 95.3 94.4 93.1  PLT 37* 28* 27* 24*   Basic Metabolic Panel: Recent Labs  Lab 09/27/19 1700 09/28/19 0434 09/29/19 0504 09/30/19 0436  NA 135 134* 134* 133*  K 3.8 3.9 3.8 3.7  CL 98 100 99 100  CO2 25 25 24 24   GLUCOSE 129* 97 79 103*  BUN 25* 27* 26* 27*  CREATININE 1.11* 1.04* 1.02* 0.91  CALCIUM 8.8* 8.5* 8.3* 8.3*  MG  --   --   --  1.8  PHOS  --   --   --  3.1   Liver Function Tests: Recent Labs  Lab 09/27/19 1700 09/28/19 0434 09/29/19 0504 09/30/19 0436  AST 88* 62* 61* 60*  ALT 47* 38 34 32  ALKPHOS 166* 132* 123 122  BILITOT 5.0* 3.9* 3.7* 3.8*  PROT 6.6 5.7* 5.2* 5.3*  ALBUMIN 3.5 2.9* 2.7* 2.7*   Coagulation Profile: Recent Labs  Lab 09/27/19 2025 09/30/19 0436  INR 1.3* 1.4*   HbA1C: No results for input(s): HGBA1C in the last 72 hours. CBG: No results for input(s): GLUCAP in the last 168 hours.  Recent Results (from the past 240 hour(s))  SARS Coronavirus 2 by RT PCR (hospital order, performed in [facility] hospital lab) Nasopharyngeal Nasopharyngeal Swab      Status: None   Collection Time: 09/27/19  8:30 PM   Specimen: Nasopharyngeal Swab  Result Value Ref Range Status   SARS Coronavirus 2 NEGATIVE NEGATIVE Final    Comment: (NOTE) SARS-CoV-2 target nucleic acids are NOT DETECTED.  The SARS-CoV-2 RNA is generally detectable in upper and lower respiratory specimens during the acute phase of infection. The lowest concentration of SARS-CoV-2 viral copies this assay can detect is 250 copies / mL. A negative result does not preclude SARS-CoV-2 infection and should not be used as the sole basis for treatment or other patient  management decisions.  A negative result may occur with improper specimen collection / handling, submission of specimen other than nasopharyngeal swab, presence of viral mutation(s) within the areas targeted by this assay, and inadequate number of viral copies (<250 copies / mL). A negative result must be combined with clinical observations, patient history, and epidemiological information.  Fact Sheet for Patients:   BoilerBrush.com.cy  Fact Sheet for Healthcare Providers: https://pope.com/  This test is not yet approved or  cleared by the [place] FDA and has been authorized for detection and/or diagnosis of SARS-CoV-2 by FDA under an Emergency Use Authorization (EUA).  This EUA will remain in effect (meaning this test can be used) for the duration of the COVID-19 declaration under Section 564(b)(1) of the Act, 21 U.S.C. section 360bbb-3(b)(1), unless the authorization is terminated or revoked sooner.  Performed at [facility], [address] [place], [place] [address]   Body fluid culture     Status: None (Preliminary result)   Collection Time: 09/28/19 10:55 AM   Specimen: PATH Cytology Peritoneal fluid  Result Value Ref Range Status   Specimen Description   Final    CYTO PERI Performed at [facility], [address], [place], [place] [address]    Special Requests   Final    NONE Performed at [facility], [address], [place], [place] [address]    Gram Stain   Final    RARE WBC PRESENT, PREDOMINANTLY MONONUCLEAR NO ORGANISMS SEEN    Culture   Final    NO GROWTH 2 DAYS Performed at [facility], [address], [place], [place] [address]    Report Status PENDING  Incomplete     Radiology Studies: No results found.  Pamella Pert, MD, PhD [facility]  Between 7 am - 7 pm I am available, please contact me via Amion or Securechat  Between 7 pm - 7 am I am not available, please contact night coverage MD/APP via Amion

## 2019-09-30 NOTE — Care Management Important Message (Signed)
Important Message  Patient Details  IM Letter presented to the Patient Name: Jessica Cline MRN: [id number] Date of Birth: 11/07/1965   Medicare Important Message Given:  Yes     Caren Macadam 09/30/2019, 11:09 AM

## 2019-09-30 NOTE — Progress Notes (Signed)
Patient ID: Jessica Cline, female   DOB: [birth year], 54 y.o.   MRN: [id number]    Progress Note   Subjective   day # 3  CC; cirrhosis with anasarca  Patient is a difficult historian, she does not think she has had any significant improvement in lower extremity edema, and may have some increase in abdominal girth  No weights recorded over the past 2 days though ordered  AFP 2.7 Today's labs-INR 1.4 BUN 27/creatinine 0.91 improved T bili 3.8/AST 60/ALT 32/alk phos 122 Hemoglobin 11.2/platelets 24 stable   Peritoneal fluid cytology-reactive mesothelial cells Peritoneal fluid albumin<1      Objective   Vital signs in last 24 hours: Temp:  [97.6 F (36.4 C)-98.2 F (36.8 C)] 97.6 F (36.4 C) (07/09 0921) Pulse Rate:  [89-102] 90 (07/09 0921) Resp:  [16-18] 17 (07/09 0921) BP: (120-148)/(60-68) 129/63 (07/09 0921) SpO2:  [92 %-97 %] 97 % (07/09 0921) Last BM Date: 09/29/19 General: Older white female in NAD Heart:  Regular rate and rhythm; no murmurs Lungs: Respirations even and unlabored, lungs CTA bilaterally Abdomen:  Soft, morbidly obese nontender and nondistended. Normal bowel sounds.  No appreciable fluid wave Extremities: 2+ edema bilaterally to the knee Neurologic:  Alert and oriented,  grossly normal neurologically.  Tangential historian Psych:  Cooperative. Normal mood and affect.  Intake/Output from previous day: 07/08 0701 - 07/09 0700 In: 720 [P.O.:720] Out: 300 [Urine:300] Intake/Output this shift: No intake/output data recorded.  Lab Results: Recent Labs    09/28/19 0434 09/29/19 0504 09/30/19 0436  WBC 5.4 5.7 5.0  HGB 11.6* 11.2* 11.2*  HCT 36.1 35.1* 33.6*  PLT 28* 27* 24*   BMET Recent Labs    09/28/19 0434 09/29/19 0504 09/30/19 0436  NA 134* 134* 133*  K 3.9 3.8 3.7  CL 100 99 100  CO2 _0 GLUCOSE 97 79 103*  BUN 27* 26* 27*  CREATININE 1.04* 1.02* 0.91  CALCIUM 8.5* 8.3* 8.3*   LFT Recent Labs    09/30/19 0436    PROT 5.3*  ALBUMIN 2.7*  AST 60*  ALT 32  ALKPHOS 122  BILITOT 3.8*   PT/INR Recent Labs    09/27/19 2025 09/30/19 0436  LABPROT 16.1* 16.7*  INR 1.3* 1.4*    Studies/Results: DG Abd 1 View  Result Date: 09/29/2019 CLINICAL DATA:  Ileus. EXAM: ABDOMEN - 1 VIEW COMPARISON:  CT 09/27/2019. FINDINGS: Mildly prominent air-filled loops of small and large bowel noted. Findings most consistent with adynamic ileus. No free air. Degenerative changes scoliosis thoracolumbar spine. No acute bony abnormality identified. IMPRESSION: Mildly prominent air-filled loops of small large bowel most consistent with adynamic ileus. Continued follow-up exams suggested to exclude bowel obstruction. Reference is made to CT report of 09/27/2019. Electronically Signed   By: Marcello Moores  Register   On: 09/29/2019 05:47   US Paracentesis  Result Date: 09/28/2019 INDICATION: Patient with history of cirrhosis, thrombocytopenia, hypertension, small-bowel obstruction, splenomegaly, ascites; request received for diagnostic and therapeutic paracentesis. EXAM: ULTRASOUND GUIDED DIAGNOSTIC AND THERAPEUTIC PARACENTESIS MEDICATIONS: None COMPLICATIONS: None immediate. PROCEDURE: Informed written consent was obtained from the patient after a discussion of the risks, benefits and alternatives to treatment. A timeout was performed prior to the initiation of the procedure. Initial ultrasound scanning demonstrates a large amount of ascites within the left lower abdominal quadrant. The left lower abdomen was prepped and draped in the usual sterile fashion. 1% lidocaine was used for local anesthesia. Following this, a 19 gauge, 10-cm, Yueh catheter  was introduced. An ultrasound image was saved for documentation purposes. The paracentesis was performed. The catheter was removed and a dressing was applied. The patient tolerated the procedure well without immediate post procedural complication. Patient received post-procedure intravenous albumin;  see nursing notes for details. FINDINGS: A total of approximately 8.3 liters of yellow fluid was removed. Samples were sent to the laboratory as requested by the clinical team. IMPRESSION: Successful ultrasound-guided diagnostic and therapeutic paracentesis yielding 8.3 liters of peritoneal fluid. Read by: Rowe Robert, PA-C Electronically Signed   By: Lucrezia Europe M.D.   On: 09/28/2019 12:40       Assessment / Plan:    #44 54 year old female with decompensated cirrhosis secondary to Nash/EtOH presenting with new anasarca. She has had chronic splenomegaly and thrombocytopenia. Patient relates 30+ pound weight gain over the past few months.  Diuresis initiated on admission 2 days ago.  She has not had any significant change in peripheral edema or abdominal girth as yet. She is status post 8.3 L paracentesis 09/28/2019.  Cytology shows reactive mesothelial cells, no evidence for SBP, albumin less than 1  Renal function stable.  #2 elevated venous ammonia without obvious encephalopathy-continue low-dose lactulose and added Xifaxan 550 twice daily  #3 cholelithiasis #4 morbid obesity #5 history of hypertension--agree with stopping amlodipine, in setting of decompensated cirrhosis now requiring diuresis.  Plan; will ask dietitian to see regarding instruction in 2 g sodium diet for home Plan is to increase Lasix to 40 mg IV twice daily over the next couple of days and add IV albumin daily to promote diuresis.  Need daily weights We will check a Doppler ultrasound to evaluate portal vein  Patient has a follow-up office visit scheduled with Dr. Tarri Glenn next week, plan for her to keep that appointment.     Principal Problem:   SBO (small bowel obstruction) (HCC) Active Problems:   Thrombocytopenia ([place])   Alcoholic cirrhosis of liver with ascites ([place])     LOS: 2 days   Kiernan Atkerson EsterwoodPA-C  09/30/2019, 9:49 AM

## 2019-09-30 NOTE — Progress Notes (Signed)
Nutrition Education Note  RD consulted for nutrition education regarding low sodium diet.  RD working off-site and was unable to reach pt by phone. Per review of chart, suspect pt would benefit more from in-person education. Will provide education 7/12 if still admitted. Placed handouts in discharge instructions as well.   RD provided "Low Sodium Nutrition Therapy" "Reducing sodium" handout from the Academy of [employer] and Dietetics. Provides examples on ways to decrease sodium intake in diet. Discourages intake of processed foods and use of salt shaker. Encourages fresh fruits and vegetables as well as whole grain sources of carbohydrates to maximize fiber intake.    Body mass index is 48.64 kg/m. Pt meets criteria for morbid obesity based on current BMI.  Current diet order is full liquids, patient is consuming liquids at this time.  Labs and medications reviewed.   If additional nutrition issues arise, please re-consult RD.   Tilda Franco, MS, RD, LDN Inpatient Clinical Dietitian Contact information available via Amion

## 2019-10-01 ENCOUNTER — Inpatient Hospital Stay (HOSPITAL_COMMUNITY): Payer: Medicare Other

## 2019-10-01 LAB — BODY FLUID CULTURE: Culture: NO GROWTH

## 2019-10-01 LAB — CBC
HCT: 33.3 % — ABNORMAL LOW (ref 36.0–46.0)
Hemoglobin: 10.9 g/dL — ABNORMAL LOW (ref 12.0–15.0)
MCH: 30.5 pg (ref 26.0–34.0)
MCHC: 32.7 g/dL (ref 30.0–36.0)
MCV: 93.3 fL (ref 80.0–100.0)
Platelets: 23 10*3/uL — CL (ref 150–400)
RBC: 3.57 MIL/uL — ABNORMAL LOW (ref 3.87–5.11)
RDW: 15.8 % — ABNORMAL HIGH (ref 11.5–15.5)
WBC: 3.9 10*3/uL — ABNORMAL LOW (ref 4.0–10.5)
nRBC: 0 % (ref 0.0–0.2)

## 2019-10-01 LAB — BASIC METABOLIC PANEL
Anion gap: 7 (ref 5–15)
BUN: 21 mg/dL — ABNORMAL HIGH (ref 6–20)
CO2: 25 mmol/L (ref 22–32)
Calcium: 8 mg/dL — ABNORMAL LOW (ref 8.9–10.3)
Chloride: 100 mmol/L (ref 98–111)
Creatinine, Ser: 0.91 mg/dL (ref 0.44–1.00)
GFR calc Af Amer: >60 mL/min (ref >60)
GFR calc non Af Amer: >60 mL/min (ref >60)
Glucose, Bld: 94 mg/dL (ref 70–99)
Potassium: 3.5 mmol/L (ref 3.5–5.1)
Sodium: 132 mmol/L — ABNORMAL LOW (ref 135–145)

## 2019-10-01 MED ORDER — SPIRONOLACTONE 100 MG PO TABS
100.0000 mg | ORAL_TABLET | Freq: Two times a day (BID) | ORAL | Status: DC
  Administered 2019-10-01 – 2019-10-06 (×10): 100 mg via ORAL
  Filled 2019-10-01 (×11): qty 1

## 2019-10-01 MED ORDER — ALBUMIN HUMAN 25 % IV SOLN
25.0000 g | Freq: Four times a day (QID) | INTRAVENOUS | Status: DC
  Administered 2019-10-01 – 2019-10-03 (×7): 25 g via INTRAVENOUS
  Administered 2019-10-03: 12.5 g via INTRAVENOUS
  Administered 2019-10-03 – 2019-10-05 (×7): 25 g via INTRAVENOUS
  Filled 2019-10-01 (×17): qty 100

## 2019-10-01 MED ORDER — ALBUMIN HUMAN 25 % IV SOLN
25.0000 g | Freq: Once | INTRAVENOUS | Status: AC
  Administered 2019-10-01: 25 g via INTRAVENOUS
  Filled 2019-10-01: qty 100

## 2019-10-01 NOTE — Progress Notes (Signed)
PROGRESS NOTE  Jessica Cline CBJ:[id number] DOB: Jul 29, 1965 DOA: 09/27/2019 PCP: [name] Gerome Apley, PA-C   LOS: 3 days   Brief Narrative / Interim history: 54 year old female with history of liver cirrhosis, thrombocytopenia, essential hypertension came into the hospital with chief complaint of fluid overload as well as abdominal pain, nausea.  She is being followed by gastroenterology as an outpatient is believed that she has liver disease due to combination of EtOH cirrhosis/[place] (tells me she quit alcohol into her mid 30s).  Has not had an EGD due to thrombocytopenia.  She is also seeing oncology as an outpatient.  Imaging on admission concerning for small bowel obstruction and general surgery was consulted  Subjective / 24h Interval events: Complains of her abdomen, she feels like it is actually getting filled with fluid again.  She denies any shortness of breath.  Does not appreciate any improvement in her leg swelling  Assessment & Plan: Principal Problem Decompensated liver cirrhosis with ascites, splenomegaly, thrombocytopenia-new onset of ascites and significant fluid overload.  Gastroenterology consulted and following.  Diuresing better with increased dose of Lasix and albumin, continue, still has ways to go   Active Problems Small bowel obstruction /adynamic ileus-this is likely in the setting of significant ascites, improving.  General surgery was initially consulted, signed off on 7/8.  Essential hypertension-Norvasc on hold to allow more room for diuresis, blood pressure stable.  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  She is not having significant increase in her urinary output off DDAVP, sodium is stable and she is in  fact slightly hyponatremic in the setting of fluid overload, will discontinue DDAVP for now as she does not fit clinical criteria   Scheduled Meds: . furosemide  40 mg Intravenous BID  . lactulose  10 g Oral TID  . rifaximin  550 mg Oral BID  . sertraline  200 mg Oral Daily  . spironolactone  100 mg Oral Daily  . traZODone  200 mg Oral QHS   Continuous Infusions: . sodium chloride Stopped (09/27/19 2059)   PRN Meds:.ipratropium-albuterol, ondansetron **OR** ondansetron (ZOFRAN) IV  Diet Orders (From admission, onward)    Start     Ordered   09/28/19 1258  Diet full liquid Room service appropriate? Yes; Fluid consistency: Thin  Diet effective now       Comments: 2 gram sodium  Question Answer Comment  Room service appropriate? Yes   Fluid consistency: Thin      09/28/19 1300          DVT prophylaxis: SCDs Start: 09/27/19 2046     Code Status: Full Code  Family Communication: no family at bedside   Status is: Inpatient  Remains inpatient: Significant fluid overload, persistent  Dispo: The patient is from: Home              Anticipated d/c is to: Home              Anticipated d/c date is: 3 days              Patient currently is not medically stable to d/c.  Consultants:  General surgery  GI  Procedures:  Paracentesis 7/7  Microbiology  none  Antimicrobials: none    Objective:  Vitals:   10/01/19 0624 10/01/19 0959 10/01/19 1027 10/01/19 1315  BP: (!) 112/52 123/73  (!) 151/62  Pulse: 93 94  92  Resp:  18  19  Temp:  97.7 F (36.5 C)  97.7 F (36.5 C)  TempSrc:  Oral  Oral  SpO2:  97%  100%  Weight:   130.4 kg   Height:        Intake/Output Summary (Last 24 hours) at 10/01/2019 1330 Last data filed at 10/01/2019 1317 Gross per 24 hour  Intake 950.29 ml  Output 900 ml  Net 50.29 ml   Filed Weights   09/28/19 1209 09/30/19 1038 10/01/19 1027  Weight: 127.9 kg 132.6 kg 130.4 kg    Examination:  Constitutional: No distress, in bed Eyes:  No icterus ENMT: Moist mucous membranes Neck: normal, supple Respiratory: No wheezing, diminished at the bases Cardiovascular: Regular rate and rhythm, no murmurs, 2+ pitting edema Abdomen: Obese, distended, no guarding or rebound, bowel sounds positive Musculoskeletal: no clubbing / cyanosis.  Skin: No rashes Neurologic: No focal deficits  Data Reviewed: I have independently reviewed following labs and imaging studies   CBC: Recent Labs  Lab 09/27/19 1700 09/28/19 0434 09/29/19 0504 09/30/19 0436 10/01/19 0517  WBC 8.1 5.4 5.7 5.0 3.9*  NEUTROABS 6.3  --   --   --   --   HGB 12.9 11.6* 11.2* 11.2* 10.9*  HCT 39.7 36.1 35.1* 33.6* 33.3*  MCV 92.8 95.3 94.4 93.1 93.3  PLT 37* 28* 27* 24* 23*   Basic Metabolic Panel: Recent Labs  Lab 09/27/19 1700 09/28/19 0434 09/29/19 0504 09/30/19 0436 10/01/19 0517  NA 135 134* 134* 133* 132*  K 3.8 3.9 3.8 3.7 3.5  CL 98 100 99 100 100  CO2 25 25 24 24 25   GLUCOSE 129* 97 79 103* 94  BUN 25* 27* 26* 27* 21*  CREATININE 1.11* 1.04* 1.02* 0.91 0.91  CALCIUM 8.8* 8.5* 8.3* 8.3* 8.0*  MG  --   --   --  1.8  --   PHOS  --   --   --  3.1  --    Liver Function Tests: Recent Labs  Lab 09/27/19 1700 09/28/19 0434 09/29/19 0504 09/30/19 0436  AST 88* 62* 61* 60*  ALT 47* 38 34 32  ALKPHOS 166* 132* 123 122  BILITOT 5.0* 3.9* 3.7* 3.8*  PROT 6.6 5.7* 5.2* 5.3*  ALBUMIN 3.5 2.9* 2.7* 2.7*   Coagulation Profile: Recent Labs  Lab 09/27/19 2025 09/30/19 0436  INR 1.3* 1.4*   HbA1C: No results for input(s): HGBA1C in the last 72 hours. CBG: No results for input(s): GLUCAP in the last 168 hours.  Recent Results (from the past 240 hour(s))  SARS Coronavirus 2 by RT PCR (hospital order, performed in [facility] hospital lab) Nasopharyngeal Nasopharyngeal Swab     Status: None   Collection Time: 09/27/19  8:30 PM   Specimen: Nasopharyngeal Swab  Result Value Ref Range Status   SARS Coronavirus 2 NEGATIVE NEGATIVE Final     Comment: (NOTE) SARS-CoV-2 target nucleic acids are NOT DETECTED.  The SARS-CoV-2 RNA is generally detectable in upper and lower respiratory specimens during the acute phase of infection. The lowest concentration of SARS-CoV-2 viral copies this assay can detect is 250 copies / mL. A negative result does not preclude SARS-CoV-2 infection and should not be used as the sole basis for treatment or other patient management decisions.  A negative result may occur with improper specimen collection /  handling, submission of specimen other than nasopharyngeal swab, presence of viral mutation(s) within the areas targeted by this assay, and inadequate number of viral copies (<250 copies / mL). A negative result must be combined with clinical observations, patient history, and epidemiological information.  Fact Sheet for Patients:   BoilerBrush.com.cy  Fact Sheet for Healthcare Providers: https://pope.com/  This test is not yet approved or  cleared by the [place] FDA and has been authorized for detection and/or diagnosis of SARS-CoV-2 by FDA under an Emergency Use Authorization (EUA).  This EUA will remain in effect (meaning this test can be used) for the duration of the COVID-19 declaration under Section 564(b)(1) of the Act, 21 U.S.C. section 360bbb-3(b)(1), unless the authorization is terminated or revoked sooner.  Performed at [facility], [address] [place], [place] [address]   Body fluid culture     Status: None   Collection Time: 09/28/19 10:55 AM   Specimen: PATH Cytology Peritoneal fluid  Result Value Ref Range Status   Specimen Description   Final    CYTO PERI Performed at [facility], [address], [place], [place] [address]    Special Requests   Final    NONE Performed at [facility], [address], [place], [place] [address]    Gram Stain   Final     RARE WBC PRESENT, PREDOMINANTLY MONONUCLEAR NO ORGANISMS SEEN    Culture   Final    NO GROWTH Performed at [facility], [address] [place], [place] [address]    Report Status 10/01/2019 FINAL  Final     Radiology Studies: US LIVER DOPPLER  Result Date: 10/01/2019 CLINICAL DATA:  Cirrhosis EXAM: DUPLEX ULTRASOUND OF LIVER TECHNIQUE: Color and duplex Doppler ultrasound was performed to evaluate the hepatic in-flow and out-flow vessels. Technologist describes technically difficult study secondary to body habitus and bowel gas. COMPARISON:  CT 09/27/2019 FINDINGS: Liver: Coarse heterogenous echotexture.  Nodular capsular contour. No focal lesion, mass or intrahepatic biliary ductal dilatation. Main Portal Vein size: 1.4 cm Portal Vein Velocities (all hepatopetal): Main Prox:  12 cm/sec Main Mid: 12 cm/sec Main Dist:  17 cm/sec Right: 13 cm/sec Left: 18 cm/sec Hepatic Vein Velocities (all hepatofugal): Right:  27 cm/sec Middle:  24 cm/sec Left:  29 cm/sec IVC: Present and patent with normal respiratory phasicity. 58 cm/sec. Hepatic Artery Velocity: Not visualized Splenic Vein Velocity:  16 cm/sec Spleen: 14.1 cm x 7.2 cm x 16.7 cm with a total volume of 889 cm^3 (411 cm^3 is upper limit normal) Portal Vein Occlusion/Thrombus: None identified, limited evaluation of left portal venous system. Splenic Vein Occlusion/Thrombus: No Ascites: Moderate perihepatic Varices: None identified Common bile duct is 2.9 mm thickness, unremarkable The gallbladder is incompletely distended, measured 1 measured up to 2.6 mm in thickness. 5 mm calculus. No pericholecystic fluid. No Murphy sign described by technologist. IMPRESSION: 1. No vascular abnormality on hepatic Doppler evaluation. 2. Hepatic morphologic changes consistent with cirrhosis, with stigmata of portal venous hypertension. 3. Cholelithiasis without evidence of acute cholecystitis or biliary obstruction. Electronically Signed   By: Corlis Leak M.D.    On: 10/01/2019 12:20    Pamella Pert, MD, PhD [facility]  Between 7 am - 7 pm I am available, please contact me via Amion or Securechat  Between 7 pm - 7 am I am not available, please contact night coverage MD/APP via Amion

## 2019-10-01 NOTE — Evaluation (Signed)
Physical Therapy Evaluation Patient Details Name: Jessica Cline MRN: [id number] DOB: 1965-12-22 Today's Date: 10/01/2019   History of Present Illness  54 year old female with history of liver cirrhosis, thrombocytopenia, essential hypertension came into the hospital with chief complaint of fluid overload as well as abdominal pain, nausea. Dx of SBO.  Clinical Impression  Pt admitted with above diagnosis. Pt ambulated 140' with RW without loss of balance. She does report h/o multiple falls at home in the past year. HHPT recommended for balance training and home safety evaluation. Pt currently with functional limitations due to the deficits listed below (see PT Problem List). Pt will benefit from skilled PT to increase their independence and safety with mobility to allow discharge to the venue listed below.       Follow Up Recommendations Home health PT    Equipment Recommendations  None recommended by PT    Recommendations for Other Services       Precautions / Restrictions Precautions Precautions: Fall Precaution Comments: pt reports multiple falls in past 1 year, "my legs give out", most recent fall 1 1/2 weeks ago Restrictions Weight Bearing Restrictions: No      Mobility  Bed Mobility Overal bed mobility: Modified Independent             General bed mobility comments: used bedrail  Transfers Overall transfer level: Needs assistance Equipment used: Rolling walker (2 wheeled) Transfers: Sit to/from Stand Sit to Stand: Min guard         General transfer comment: VCs hand placement  Ambulation/Gait Ambulation/Gait assistance: Min guard Gait Distance (Feet): 140 Feet Assistive device: Rolling walker (2 wheeled) Gait Pattern/deviations: Step-through pattern;Decreased step length - right;Decreased step length - left Gait velocity: decr   General Gait Details: steady with RW, no loss of balance, distance limited by fatigue  Stairs            Wheelchair  Mobility    Modified Rankin (Stroke Patients Only)       Balance Overall balance assessment: History of Falls;Needs assistance   Sitting balance-Leahy Scale: Good       Standing balance-Leahy Scale: Fair                               Pertinent Vitals/Pain Pain Assessment: No/denies pain    Home Living Family/patient expects to be discharged to:: Private residence Living Arrangements: Alone     Home Access: Ramped entrance     Home Layout: One level Home Equipment: Environmental consultant - 2 wheels;Bedside commode      Prior Function Level of Independence: Independent with assistive device(s)         Comments: walks with RW, hasn't driven in 2 weeks due to abdominal swelling ("my belly can't fit in the car"), stated "all my family is dead" but there is a friend who can assist with transportation/meal delivery     Hand Dominance        Extremity/Trunk Assessment   Upper Extremity Assessment Upper Extremity Assessment: Overall WFL for tasks assessed    Lower Extremity Assessment Lower Extremity Assessment: Overall WFL for tasks assessed    Cervical / Trunk Assessment Cervical / Trunk Assessment: Normal  Communication   Communication: No difficulties  Cognition Arousal/Alertness: Awake/alert Behavior During Therapy: WFL for tasks assessed/performed Overall Cognitive Status: Within Functional Limits for tasks assessed  General Comments      Exercises     Assessment/Plan    PT Assessment Patient needs continued PT services  PT Problem List Decreased activity tolerance;Decreased balance       PT Treatment Interventions Gait training;Therapeutic activities;Balance training;Therapeutic exercise;Patient/family education    PT Goals (Current goals can be found in the Care Plan section)  Acute Rehab PT Goals Patient Stated Goal: stop falling PT Goal Formulation: With patient Time For Goal  Achievement: 10/15/19 Potential to Achieve Goals: Good    Frequency Min 3X/week   Barriers to discharge        Co-evaluation               AM-PAC PT "6 Clicks" Mobility  Outcome Measure Help needed turning from your back to your side while in a flat bed without using bedrails?: A Little Help needed moving from lying on your back to sitting on the side of a flat bed without using bedrails?: A Little Help needed moving to and from a bed to a chair (including a wheelchair)?: A Little Help needed standing up from a chair using your arms (e.g., wheelchair or bedside chair)?: A Little Help needed to walk in hospital room?: A Little Help needed climbing 3-5 steps with a railing? : A Little 6 Click Score: 18    End of Session Equipment Utilized During Treatment: Gait belt Activity Tolerance: Patient tolerated treatment well Patient left: Other (comment);with call bell/phone within reach (on bedside commode, callbell in reach) Nurse Communication: Mobility status;Other (comment) (pt on bedside commode) PT Visit Diagnosis: Difficulty in walking, not elsewhere classified (R26.2);History of falling (Z91.81)    Time: 1950-9326 PT Time Calculation (min) (ACUTE ONLY): 16 min   Charges:   PT Evaluation $PT Eval Low Complexity: 1 Low         Ralene Bathe Kistler PT 10/01/2019  Acute Rehabilitation Services Pager [phone] Office [phone]

## 2019-10-01 NOTE — Progress Notes (Addendum)
[facility] Progress Note  CC:  Decompensated cirrhosis   Subjective:  She reports feeling well this am. No significant abdominal pain. No N/V. She passed a small brown BM this morning. No SOB this am.    Objective:  Vital signs in last 24 hours: Temp:  [97.6 F (36.4 C)-98.4 F (36.9 C)] 97.6 F (36.4 C) (07/10 0607) Pulse Rate:  [90-101] 93 (07/10 0624) Resp:  [17-20] 20 (07/10 0607) BP: (112-142)/(52-70) 112/52 (07/10 0624) SpO2:  [96 %-97 %] 97 % (07/10 0607) Weight:  [132.6 kg] 132.6 kg (07/09 1038) Last BM Date: 10/01/19   General: Alert 54 year old female in NAD. Eyes: No scleral icterus.  Heart: RRR, no murmur.  Pulm:  Diffuse coarse expiratory wheezes throughout.  Abdomen: Obese abdomen with anasarca to the lower abdomen, mild ascites, abdomen is not tense. Generalized upper abdominal tenderness without rebound or guarding. Hepatomegaly. Hypoactive BS x 4 quads.  Extremities:  Without edema. Neurologic:  Alert and  oriented x4;  grossly normal neurologically. Psych:  Alert and cooperative. Normal mood and affect.  Intake/Output from previous day: 07/09 0701 - 07/10 0700 In: 909.8 [P.O.:840; IV Piggyback:69.8] Out: 0  Intake/Output this shift: No intake/output data recorded.  Lab Results: Recent Labs    09/29/19 0504 09/30/19 0436 10/01/19 0517  WBC 5.7 5.0 3.9*  HGB 11.2* 11.2* 10.9*  HCT 35.1* 33.6* 33.3*  PLT 27* 24* 23*   BMET Recent Labs    09/29/19 0504 09/30/19 0436 10/01/19 0517  NA 134* 133* 132*  K 3.8 3.7 3.5  CL 99 100 100  CO2 _0 GLUCOSE 79 103* 94  BUN 26* 27* 21*  CREATININE 1.02* 0.91 0.91  CALCIUM 8.3* 8.3* 8.0*   LFT Recent Labs    09/30/19 0436  PROT 5.3*  ALBUMIN 2.7*  AST 60*  ALT 32  ALKPHOS 122  BILITOT 3.8*   PT/INR Recent Labs    09/30/19 0436  LABPROT 16.7*  INR 1.4*   Hepatitis Panel No results for input(s): HEPBSAG, HCVAB, HEPAIGM, HEPBIGM in the last 72 hours.  No  results found.  Assessment / Plan:  24. 54 year old female with decompensated NASH/EtOH cirrhosis. Anasarca. CTAP showed a large amount of ascites and a cirrhotic appearing liver. S/P LVP 7/7 with removal of 8.3 L. No SBP. Cytology without evidence of malignant cells. AFP 2.7. Labs 7/9 AST 60. ALT 32. Alk phos 122. T. Bili 3.8. Albumin 2.7. Received Albumin 25gm IV 7/9, repeat dose ordered today. Liver doppler in process at bedside at this time. No signs of hepatic encephalopathy.  -Continue Lactulose 10gm po tid. Xifaxan 53m po bid.  -Continue Lasix and Spironolactone -2000 gm sodium diet  -Nutrition consult recommended prior to discharge -Eventual EGD for variceal screening (Will most likely require platelet transfusion prior to EGD) -Repeat hepatic panel, BMP, CBC and INR in am -Consider ECHO to assess right heart function   2. Thrombocytopenia secondary to cirrhosis. PLT 27 -> 24 -> 23.  -May require platelet transfusion  -Repeat CBC in am   3. Anemia. Hg 11.2 -> 10.9. MCV 93.3. No signs of active GI bleeding.   4. Ileus, most likely due to ascites.  Xray 7/8 showed mildly prominent air-filled loops of small large bowel most consistent with adynamic ileus. General surgery consult, no surgical intervention required. Patient reported passing a small brown solid stool this am.  5. AKI. Past right nephrectomy. Stable Cr 0.91. -Continue to  Monitor renal  status closely   6. Hyponatremia secondary to cirrhosis and diuretic use. On Lasix 54m IV bid and Spironolactone 1084mpo QD. Questionable history of  diabetes insipidus. -Repeat BMP in am  7. Coagulopathy. INR 1.4 on 7/9. -Repeat INR/PT in am  Further recommendations per Dr. GuLyndel Safe Principal Problem:   SBO (small bowel obstruction) (HCPortageActive Problems:   Thrombocytopenia (HCKit Carson  Ascites due to alcoholic cirrhosis (HCGurdon  Anasarca     LOS: 3 days   CoNoralyn Pick7/12/2019, 8:15 AM     Attending  physician's note   I have taken an interval history, reviewed the chart and examined the patient. I agree with the Advanced Practitioner's note, impression and recommendations.   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 US[place] neg for portal vein or hepatic vein thrombosis. MELD-Na 20. Ascites with generalized anasarca s/p LVP 7/7. No SBP. USKoreahowed moderate ascites. Subclinical hepatic encephalopathy.   Hypersplenism with thrombocytopenia and splenomegaly, jaundice, coagulopathy. Ileus (resolved) Asymptomatic cholelithiasis   Plan: -IV alb 25g Q6hrs -Continue Lasix 40 IV twice daily/increase spironolactone 20073mo qd  -Monitor weight -Continue rifaximin 550 mg p.o. twice daily and lactulose 3 times daily (titrate to 2-3 softerBMs per day) -Monitor and correct electrolytes.  Trend LFTs, Cr -Low-salt normal protein diet.  Appreciate dietary input -Stop IV normal saline.  Hep-Lock IV -If still with significant abdominal distention, repeat LVP Monday. -Avoid all nonsteroidals. -Would need EGD as outpatient to eval for varices.  No significant varices on CT. -Will follow along.   RajCarmell AustriaD LebVelora Heckler Fabienne [phone]

## 2019-10-01 NOTE — Progress Notes (Signed)
CRITICAL VALUE ALERT  Critical Value:  Platelets 23  Date & Time Notied:  10/01/2019 @0613   Provider Notified: [place]  Orders Received/Actions taken: None

## 2019-10-02 ENCOUNTER — Inpatient Hospital Stay (HOSPITAL_COMMUNITY): Payer: Medicare Other

## 2019-10-02 DIAGNOSIS — I5031 Acute diastolic (congestive) heart failure: Secondary | ICD-10-CM

## 2019-10-02 LAB — CBC
HCT: 32.2 % — ABNORMAL LOW (ref 36.0–46.0)
Hemoglobin: 10.6 g/dL — ABNORMAL LOW (ref 12.0–15.0)
MCH: 31.1 pg (ref 26.0–34.0)
MCHC: 32.9 g/dL (ref 30.0–36.0)
MCV: 94.4 fL (ref 80.0–100.0)
Platelets: 23 10*3/uL — CL (ref 150–400)
RBC: 3.41 MIL/uL — ABNORMAL LOW (ref 3.87–5.11)
RDW: 16 % — ABNORMAL HIGH (ref 11.5–15.5)
WBC: 3.4 10*3/uL — ABNORMAL LOW (ref 4.0–10.5)
nRBC: 0 % (ref 0.0–0.2)

## 2019-10-02 LAB — BASIC METABOLIC PANEL
Anion gap: 11 (ref 5–15)
BUN: 19 mg/dL (ref 6–20)
CO2: 28 mmol/L (ref 22–32)
Calcium: 8.9 mg/dL (ref 8.9–10.3)
Chloride: 100 mmol/L (ref 98–111)
Creatinine, Ser: 0.98 mg/dL (ref 0.44–1.00)
GFR calc Af Amer: >60 mL/min (ref >60)
GFR calc non Af Amer: >60 mL/min (ref >60)
Glucose, Bld: 94 mg/dL (ref 70–99)
Potassium: 3.4 mmol/L — ABNORMAL LOW (ref 3.5–5.1)
Sodium: 139 mmol/L (ref 135–145)

## 2019-10-02 LAB — PROTIME-INR
INR: 1.6 — ABNORMAL HIGH (ref 0.8–1.2)
Prothrombin Time: 18.6 seconds — ABNORMAL HIGH (ref 11.4–15.2)

## 2019-10-02 LAB — ECHOCARDIOGRAM COMPLETE
Height: 65 in
Weight: 4540.8 oz

## 2019-10-02 NOTE — Progress Notes (Signed)
PROGRESS NOTE  Jessica Cline VZD:[id number] DOB: Oct 29, 1965 DOA: 09/27/2019 PCP: [name] Gerome Apley, PA-C   LOS: 4 days   Brief Narrative / Interim history: 54 year old female with history of liver cirrhosis, thrombocytopenia, essential hypertension came into the hospital with chief complaint of fluid overload as well as abdominal pain, nausea.  She is being followed by gastroenterology as an outpatient is believed that she has liver disease due to combination of EtOH cirrhosis/[place] (tells me she quit alcohol into her mid 30s).  Has not had an EGD due to thrombocytopenia.  She is also seeing oncology as an outpatient.  Imaging on admission concerning for small bowel obstruction and general surgery was consulted  Subjective / 24h Interval events: Complains of her abdomen, she feels like it is actually getting filled with fluid again.  She denies any shortness of breath.  Does not appreciate any improvement in her leg swelling  Assessment & Plan: Principal Problem Decompensated liver cirrhosis with ascites, splenomegaly, thrombocytopenia-new onset of ascites and significant fluid overload.  Gastroenterology consulted and following.  Diuresing better with increased dose of Lasix and albumin, continue, fluid status improving   Active Problems Small bowel obstruction /adynamic ileus-this is likely in the setting of significant ascites, improving.  General surgery was initially consulted, signed off on 7/8.  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  She is not having significant increase in her urinary output off DDAVP, sodium is stable  and she is in fact slightly hyponatremic in the setting of fluid overload, will discontinue DDAVP for now as she does not fit clinical criteria   Scheduled Meds: . furosemide  40 mg Intravenous BID  . lactulose  10 g Oral TID  . rifaximin  550 mg Oral BID  . sertraline  200 mg Oral Daily  . spironolactone  100 mg Oral BID  . traZODone  200 mg Oral QHS   Continuous Infusions: . sodium chloride Stopped (09/27/19 2059)  . albumin human 25 g (10/02/19 0458)   PRN Meds:.ipratropium-albuterol, ondansetron **OR** ondansetron (ZOFRAN) IV  Diet Orders (From admission, onward)    Start     Ordered   09/28/19 1258  Diet full liquid Room service appropriate? Yes; Fluid consistency: Thin  Diet effective now       Comments: 2 gram sodium  Question Answer Comment  Room service appropriate? Yes   Fluid consistency: Thin      09/28/19 1300          DVT prophylaxis: SCDs Start: 09/27/19 2046     Code Status: Full Code  Family Communication: no family at bedside   Status is: Inpatient  Remains inpatient: Significant fluid overload, persistent  Dispo: The patient is from: Home              Anticipated d/c is to: Home              Anticipated d/c date is: 3 days              Patient currently is not medically stable to d/c.  Consultants:  General surgery  GI  Procedures:  Paracentesis 7/7  Microbiology  none  Antimicrobials: none    Objective: Vitals:   10/02/19 0247 10/02/19 0500 10/02/19 0619 10/02/19 0822  BP: (!) 114/48  (!) 115/49   Pulse: 96  96   Resp:      Temp:   98.2 F (36.8 C)   TempSrc:   Oral   SpO2:   91%   Weight:  130 kg  128.7 kg  Height:        Intake/Output Summary (Last 24 hours) at 10/02/2019 1142 Last data filed at 10/02/2019 0835 Gross per 24 hour  Intake 710.5 ml  Output 2000 ml  Net -1289.5 ml   Filed Weights   10/01/19 1027 10/02/19 0500 10/02/19 0822  Weight: 130.4 kg 130 kg 128.7 kg    Examination:  Constitutional:  NAD Eyes: no icterus ENMT: mmm Neck: normal, supple Respiratory: coarse breath sounds throughout, no wheezing Cardiovascular: rrr, no mrg, 2+ edema Abdomen: distended, NT, BS+ Musculoskeletal: no clubbing / cyanosis.  Skin: no rashes Neurologic: non focal   Data Reviewed: I have independently reviewed following labs and imaging studies   CBC: Recent Labs  Lab 09/27/19 1700 09/27/19 1700 09/28/19 0434 09/29/19 0504 09/30/19 0436 10/01/19 0517 10/02/19 0516  WBC 8.1   < > 5.4 5.7 5.0 3.9* 3.4*  NEUTROABS 6.3  --   --   --   --   --   --   HGB 12.9   < > 11.6* 11.2* 11.2* 10.9* 10.6*  HCT 39.7   < > 36.1 35.1* 33.6* 33.3* 32.2*  MCV 92.8   < > 95.3 94.4 93.1 93.3 94.4  PLT 37*   < > 28* 27* 24* 23* 23*   < > = values in this interval not displayed.   Basic Metabolic Panel: Recent Labs  Lab 09/28/19 0434 09/29/19 0504 09/30/19 0436 10/01/19 0517 10/02/19 0516  NA 134* 134* 133* 132* 139  K 3.9 3.8 3.7 3.5 3.4*  CL 100 99 100 100 100  CO2 25 24 24 25 28   GLUCOSE 97 79 103* 94 94  BUN 27* 26* 27* 21* 19  CREATININE 1.04* 1.02* 0.91 0.91 0.98  CALCIUM 8.5* 8.3* 8.3* 8.0* 8.9  MG  --   --  1.8  --   --   PHOS  --   --  3.1  --   --    Liver Function Tests: Recent Labs  Lab 09/27/19 1700 09/28/19 0434 09/29/19 0504 09/30/19 0436  AST 88* 62* 61* 60*  ALT 47* 38 34 32  ALKPHOS 166* 132* 123 122  BILITOT 5.0* 3.9* 3.7* 3.8*  PROT 6.6 5.7* 5.2* 5.3*  ALBUMIN 3.5 2.9* 2.7* 2.7*   Coagulation Profile: Recent Labs  Lab 09/27/19 2025 09/30/19 0436 10/02/19 0516  INR 1.3* 1.4* 1.6*   HbA1C: No results for input(s): HGBA1C in the last 72 hours. CBG: No results for input(s): GLUCAP in the last 168 hours.  Recent Results (from the past 240 hour(s))  SARS Coronavirus 2 by RT PCR (hospital order, performed in [facility] hospital lab) Nasopharyngeal Nasopharyngeal Swab     Status: None   Collection Time: 09/27/19  8:30 PM   Specimen: Nasopharyngeal Swab   Result Value Ref Range Status   SARS Coronavirus 2 NEGATIVE NEGATIVE Final    Comment: (NOTE) SARS-CoV-2 target nucleic acids are NOT DETECTED.  The SARS-CoV-2 RNA is generally detectable in upper and lower respiratory specimens during the acute phase of infection. The lowest concentration of SARS-CoV-2 viral copies this assay can  detect is 250 copies / mL. A negative result does not preclude SARS-CoV-2 infection and should not be used as the sole basis for treatment or other patient management decisions.  A negative result may occur with improper specimen collection / handling, submission of specimen other than nasopharyngeal swab, presence of viral mutation(s) within the areas targeted by this assay, and inadequate number of viral copies (<250 copies / mL). A negative result must be combined with clinical observations, patient history, and epidemiological information.  Fact Sheet for Patients:   BoilerBrush.com.cy  Fact Sheet for Healthcare Providers: https://pope.com/  This test is not yet approved or  cleared by the [place] FDA and has been authorized for detection and/or diagnosis of SARS-CoV-2 by FDA under an Emergency Use Authorization (EUA).  This EUA will remain in effect (meaning this test can be used) for the duration of the COVID-19 declaration under Section 564(b)(1) of the Act, 21 U.S.C. section 360bbb-3(b)(1), unless the authorization is terminated or revoked sooner.  Performed at [facility], [address], [place], [place] [address]   Body fluid culture     Status: None   Collection Time: 09/28/19 10:55 AM   Specimen: PATH Cytology Peritoneal fluid  Result Value Ref Range Status   Specimen Description   Final    CYTO PERI Performed at [facility], [address], [place], [place] [address]    Special Requests   Final    NONE Performed at [facility], [address] [place], [place] [address]    Gram Stain   Final    RARE WBC PRESENT, PREDOMINANTLY MONONUCLEAR NO ORGANISMS SEEN    Culture   Final    NO GROWTH Performed at [facility], [address] [place], [place] [address]    Report Status 10/01/2019 FINAL  Final     Radiology Studies: No results found.  Pamella Pert, MD, PhD [facility]  Between 7 am - 7 pm I am available, please contact me via Amion or Securechat  Between 7 pm - 7 am I am not available, please contact night coverage MD/APP via Amion

## 2019-10-02 NOTE — Progress Notes (Signed)
  Echocardiogram 2D Echocardiogram has been performed.  Jessica Cline 10/02/2019, 12:29 PM

## 2019-10-02 NOTE — Progress Notes (Addendum)
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 [place]- neg for portal vein or hepatic vein thrombosis. MELD-Na 20. 2DE 7/11 Nl LV/RV function, EF 60-65%  Ascites with generalized anasarca s/p LVP 7/7. No SBP. US showed moderate ascites.  Diuresing much better with IV albumin/Lasix and spironolactone.  Subclinical hepatic encephalopathy.    Hypersplenism with thrombocytopenia and splenomegaly, jaundice, coagulopathy.  Asymptomatic cholelithiasis   Plan: -Continue albumin for another 24 hours. -Transition Lasix to p.o. in a.m.  Continue spironolactone. -Monitor weight, I/Os -Replace potassium.  Recheck electrolytes in a.m. -Low-salt normal protein diet.  -If continues to diurese, likely discharge in a.m. with GI FU -Avoid all nonsteroidals. -Ambulate. -Would need EGD as outpatient to eval for varices.  No significant varices on CT. -Recommend appointment with Annamarie Major FNP ([facility]) as outpt -Dr. Leone Payor covering inpatient service from tomorrow onwards.     SUBJECTIVE  Patient to sleep.  Did not disturb. Discussed with nursing staff. Has diuresed well. No GI complaints.      OBJECTIVE:     Vital signs in last 24 hours: Temp:  [97.7 F (36.5 C)-98.8 F (37.1 C)] 97.7 F (36.5 C) (07/11 1327) Pulse Rate:  [93-97] 95 (07/11 1327) Resp:  [16-20] 20 (07/11 1327) BP: (114-139)/(44-77) 124/63 (07/11 1327) SpO2:  [91 %-98 %] 98 % (07/11 1327) Weight:  [128.7 kg-130 kg] 128.7 kg (07/11 0822) Last BM Date: 10/01/19 Did not examine..   Intake/Output from previous day: 07/10 0701 - 07/11 0700 In: 710.5 [P.O.:370; IV Piggyback:340.5] Out: 2300 [Urine:2300] Intake/Output this shift: Total I/O In: 227.9 [P.O.:120; IV Piggyback:107.9] Out: 1725 [Urine:1725]  Lab Results: Recent Labs    09/30/19 0436 10/01/19 0517 10/02/19 0516  WBC 5.0 3.9* 3.4*  HGB 11.2* 10.9* 10.6*  HCT 33.6* 33.3* 32.2*    PLT 24* 23* 23*   BMET Recent Labs    09/30/19 0436 10/01/19 0517 10/02/19 0516  NA 133* 132* 139  K 3.7 3.5 3.4*  CL 100 100 100  CO2 24 25 28   GLUCOSE 103* 94 94  BUN 27* 21* 19  CREATININE 0.91 0.91 0.98  CALCIUM 8.3* 8.0* 8.9   LFT Recent Labs    09/30/19 0436  PROT 5.3*  ALBUMIN 2.7*  AST 60*  ALT 32  ALKPHOS 122  BILITOT 3.8*   PT/INR Recent Labs    09/30/19 0436 10/02/19 0516  LABPROT 16.7* 18.6*  INR 1.4* 1.6*   Hepatitis Panel No results for input(s): HEPBSAG, HCVAB, HEPAIGM, HEPBIGM in the last 72 hours.  ECHOCARDIOGRAM COMPLETE  Result Date: 10/02/2019    ECHOCARDIOGRAM REPORT   Patient Name:   Jessica Cline Date of Exam: 10/02/2019 Medical Rec #:  12/03/2019      Height:       65.0 in Accession #:    [id number]     Weight:       283.8 lb Date of Birth:  03-Dec-1965      BSA:          2.295 m Patient Age:    53 years       BP:           115/49 mmHg Patient Gender: F              HR:           96 bpm. Exam Location:  Inpatient Procedure: 2D Echo Indications:    CHF-Acute Diastolic I50.31  History:  Patient has no prior history of Echocardiogram examinations.                 Risk Factors:Hypertension.  Sonographer:    Thurman Coyer RDCS (AE) Referring Phys: [id number] Daylene Katayama [facility] IMPRESSIONS  1. Left ventricular ejection fraction, by estimation, is 60 to 65%. The left ventricle has normal function. The left ventricle has no regional wall motion abnormalities. Left ventricular diastolic parameters were normal.  2. Right ventricular systolic function is normal. The right ventricular size is normal. Tricuspid regurgitation signal is inadequate for assessing PA pressure.  3. The mitral valve is grossly normal. No evidence of mitral valve regurgitation. No evidence of mitral stenosis.  4. The aortic valve is tricuspid. Aortic valve regurgitation is not visualized. No aortic stenosis is present. Conclusion(s)/Recommendation(s): Normal LV function. Normal diastolic  function. Ascites present. FINDINGS  Left Ventricle: Left ventricular ejection fraction, by estimation, is 60 to 65%. The left ventricle has normal function. The left ventricle has no regional wall motion abnormalities. The left ventricular internal cavity size was normal in size. There is  no left ventricular hypertrophy. Left ventricular diastolic parameters were normal. Normal left ventricular filling pressure. Right Ventricle: The right ventricular size is normal. No increase in right ventricular wall thickness. Right ventricular systolic function is normal. Tricuspid regurgitation signal is inadequate for assessing PA pressure. Left [facility]: Left atrial size was normal in size. Right [facility]: Right atrial size was normal in size. Pericardium: Trivial pericardial effusion is present. Mitral Valve: The mitral valve is grossly normal. No evidence of mitral valve regurgitation. No evidence of mitral valve stenosis. Tricuspid Valve: The tricuspid valve is grossly normal. Tricuspid valve regurgitation is trivial. No evidence of tricuspid stenosis. Aortic Valve: The aortic valve is tricuspid. Aortic valve regurgitation is not visualized. No aortic stenosis is present. Pulmonic Valve: The pulmonic valve was grossly normal. Pulmonic valve regurgitation is not visualized. No evidence of pulmonic stenosis. Aorta: The aortic root and ascending aorta are structurally normal, with no evidence of dilitation. Venous: The inferior vena cava was not well visualized. IAS/Shunts: The atrial septum is grossly normal. Additional Comments: Moderate ascites is present.  LEFT VENTRICLE PLAX 2D LVIDd:         4.50 cm  Diastology LVIDs:         2.80 cm  LV e' lateral:   13.50 cm/s LV PW:         1.00 cm  LV E/e' lateral: 7.8 LV IVS:        0.80 cm  LV e' medial:    9.68 cm/s LVOT diam:     2.00 cm  LV E/e' medial:  10.8 LV SV:         76 LV SV Index:   33 LVOT Area:     3.14 cm  RIGHT VENTRICLE RV S prime:     15.10 cm/s TAPSE (M-mode):  2.2 cm LEFT ATRIUM             Index       RIGHT ATRIUM           Index LA diam:        4.20 cm 1.83 cm/m  RA Area:     15.60 cm LA Vol (A2C):   56.4 ml 24.57 ml/m RA Volume:   35.80 ml  15.60 ml/m LA Vol (A4C):   43.4 ml 18.91 ml/m LA Biplane Vol: 49.5 ml 21.57 ml/m  AORTIC VALVE LVOT Vmax:   135.00 cm/s LVOT Vmean:  88.400 cm/s LVOT VTI:    0.242 m  AORTA Ao Root diam: 3.00 cm Ao Asc diam:  2.80 cm MITRAL VALVE MV Area (PHT): 3.93 cm     SHUNTS MV Decel Time: 193 msec     Systemic VTI:  0.24 m MV E velocity: 105.00 cm/s  Systemic Diam: 2.00 cm MV A velocity: 87.80 cm/s MV E/A ratio:  1.20 Lennie Odor MD Electronically signed by Lennie Odor MD Signature Date/Time: 10/02/2019/2:12:14 PM    Final    US LIVER DOPPLER  Result Date: 10/01/2019 CLINICAL DATA:  Cirrhosis EXAM: DUPLEX ULTRASOUND OF LIVER TECHNIQUE: Color and duplex Doppler ultrasound was performed to evaluate the hepatic in-flow and out-flow vessels. Technologist describes technically difficult study secondary to body habitus and bowel gas. COMPARISON:  CT 09/27/2019 FINDINGS: Liver: Coarse heterogenous echotexture.  Nodular capsular contour. No focal lesion, mass or intrahepatic biliary ductal dilatation. Main Portal Vein size: 1.4 cm Portal Vein Velocities (all hepatopetal): Main Prox:  12 cm/sec Main Mid: 12 cm/sec Main Dist:  17 cm/sec Right: 13 cm/sec Left: 18 cm/sec Hepatic Vein Velocities (all hepatofugal): Right:  27 cm/sec Middle:  24 cm/sec Left:  29 cm/sec IVC: Present and patent with normal respiratory phasicity. 58 cm/sec. Hepatic Artery Velocity: Not visualized Splenic Vein Velocity:  16 cm/sec Spleen: 14.1 cm x 7.2 cm x 16.7 cm with a total volume of 889 cm^3 (411 cm^3 is upper limit normal) Portal Vein Occlusion/Thrombus: None identified, limited evaluation of left portal venous system. Splenic Vein Occlusion/Thrombus: No Ascites: Moderate perihepatic Varices: None identified Common bile duct is 2.9 mm thickness, unremarkable  The gallbladder is incompletely distended, measured 1 measured up to 2.6 mm in thickness. 5 mm calculus. No pericholecystic fluid. No Murphy sign described by technologist. IMPRESSION: 1. No vascular abnormality on hepatic Doppler evaluation. 2. Hepatic morphologic changes consistent with cirrhosis, with stigmata of portal venous hypertension. 3. Cholelithiasis without evidence of acute cholecystitis or biliary obstruction. Electronically Signed   By: Corlis Leak M.D.   On: 10/01/2019 12:20     Principal Problem:   SBO (small bowel obstruction) (HCC) Active Problems:   Thrombocytopenia (HCC)   Ascites due to alcoholic cirrhosis (HCC)   Anasarca     LOS: 4 days     Edman Circle, MD 10/02/2019, 5:11 PM [facility] [phone]

## 2019-10-03 DIAGNOSIS — K7290 Hepatic failure, unspecified without coma: Secondary | ICD-10-CM

## 2019-10-03 LAB — COMPREHENSIVE METABOLIC PANEL
ALT: 26 U/L (ref 0–44)
AST: 64 U/L — ABNORMAL HIGH (ref 15–41)
Albumin: 4 g/dL (ref 3.5–5.0)
Alkaline Phosphatase: 82 U/L (ref 38–126)
Anion gap: 11 (ref 5–15)
BUN: 15 mg/dL (ref 6–20)
CO2: 27 mmol/L (ref 22–32)
Calcium: 9 mg/dL (ref 8.9–10.3)
Chloride: 105 mmol/L (ref 98–111)
Creatinine, Ser: 0.91 mg/dL (ref 0.44–1.00)
GFR calc Af Amer: >60 mL/min (ref >60)
GFR calc non Af Amer: >60 mL/min (ref >60)
Glucose, Bld: 87 mg/dL (ref 70–99)
Potassium: 4.1 mmol/L (ref 3.5–5.1)
Sodium: 143 mmol/L (ref 135–145)
Total Bilirubin: 5.4 mg/dL — ABNORMAL HIGH (ref 0.3–1.2)
Total Protein: 5.8 g/dL — ABNORMAL LOW (ref 6.5–8.1)

## 2019-10-03 LAB — CBC
HCT: 32 % — ABNORMAL LOW (ref 36.0–46.0)
Hemoglobin: 10.7 g/dL — ABNORMAL LOW (ref 12.0–15.0)
MCH: 32.3 pg (ref 26.0–34.0)
MCHC: 33.4 g/dL (ref 30.0–36.0)
MCV: 96.7 fL (ref 80.0–100.0)
Platelets: 27 10*3/uL — CL (ref 150–400)
RBC: 3.31 MIL/uL — ABNORMAL LOW (ref 3.87–5.11)
RDW: 16.4 % — ABNORMAL HIGH (ref 11.5–15.5)
WBC: 3.9 10*3/uL — ABNORMAL LOW (ref 4.0–10.5)
nRBC: 0 % (ref 0.0–0.2)

## 2019-10-03 LAB — PROTIME-INR
INR: 1.4 — ABNORMAL HIGH (ref 0.8–1.2)
Prothrombin Time: 17 seconds — ABNORMAL HIGH (ref 11.4–15.2)

## 2019-10-03 LAB — MAGNESIUM: Magnesium: 1.7 mg/dL (ref 1.7–2.4)

## 2019-10-03 LAB — PHOSPHORUS: Phosphorus: 2.7 mg/dL (ref 2.5–4.6)

## 2019-10-03 MED ORDER — FUROSEMIDE 40 MG PO TABS
40.0000 mg | ORAL_TABLET | Freq: Two times a day (BID) | ORAL | Status: DC
  Administered 2019-10-03 – 2019-10-06 (×6): 40 mg via ORAL
  Filled 2019-10-03 (×6): qty 1

## 2019-10-03 MED ORDER — MAGNESIUM SULFATE 2 GM/50ML IV SOLN
2.0000 g | Freq: Once | INTRAVENOUS | Status: AC
  Administered 2019-10-03: 2 g via INTRAVENOUS
  Filled 2019-10-03: qty 50

## 2019-10-03 MED ORDER — CLONAZEPAM 0.5 MG PO TABS: 0.2500 mg | ORAL_TABLET | Freq: Two times a day (BID) | ORAL | Status: DC | PRN

## 2019-10-03 MED ORDER — CLONAZEPAM 0.125 MG PO TBDP
0.2500 mg | ORAL_TABLET | Freq: Two times a day (BID) | ORAL | Status: DC | PRN
  Administered 2019-10-03 – 2019-10-04 (×2): 0.25 mg via ORAL
  Filled 2019-10-03 (×2): qty 2

## 2019-10-03 NOTE — Progress Notes (Signed)
Physical Therapy Treatment Patient Details Name: Jessica Cline MRN: [id number] DOB: 01-16-1966 Today's Date: 10/03/2019    History of Present Illness 54 year old female with history of liver cirrhosis, thrombocytopenia, essential hypertension came into the hospital with chief complaint of fluid overload as well as abdominal pain, nausea. Dx of SBO.    PT Comments    Pt continues to participate well with PT.    Follow Up Recommendations  Home health PT     Equipment Recommendations  None recommended by PT    Recommendations for Other Services       Precautions / Restrictions Precautions Precautions: Fall Precaution Comments: pt reports multiple falls in past 1 year, "my legs give out", most recent fall 1 1/2 weeks ago Restrictions Weight Bearing Restrictions: No    Mobility  Bed Mobility Overal bed mobility: Modified Independent                Transfers Overall transfer level: Needs assistance Equipment used: Rolling walker (2 wheeled) Transfers: Sit to/from [employer] Sit to Stand: Supervision Stand pivot transfers: Min guard       General transfer comment: VCs hand placement  Ambulation/Gait Ambulation/Gait assistance: Min guard Gait Distance (Feet): 150 Feet Assistive device: Rolling walker (2 wheeled) Gait Pattern/deviations: Step-through pattern;Decreased stride length     General Gait Details: steady with RW, no loss of balance, distance limited by fatigue   Stairs             Wheelchair Mobility    Modified Rankin (Stroke Patients Only)       Balance Overall balance assessment: History of Falls         Standing balance support: Bilateral upper extremity supported Standing balance-Leahy Scale: Fair                              Cognition Arousal/Alertness: Awake/alert Behavior During Therapy: WFL for tasks assessed/performed Overall Cognitive Status: Within Functional Limits for tasks assessed                                         Exercises      General Comments        Pertinent Vitals/Pain Pain Assessment: No/denies pain    Home Living                      Prior Function            PT Goals (current goals can now be found in the care plan section) Progress towards PT goals: Progressing toward goals    Frequency    Min 3X/week      PT Plan      Co-evaluation              AM-PAC PT "6 Clicks" Mobility   Outcome Measure  Help needed turning from your back to your side while in a flat bed without using bedrails?: None Help needed moving from lying on your back to sitting on the side of a flat bed without using bedrails?: None Help needed moving to and from a bed to a chair (including a wheelchair)?: A Little Help needed standing up from a chair using your arms (e.g., wheelchair or bedside chair)?: A Little Help needed to walk in hospital room?: A Little Help needed climbing 3-5 steps with a railing? :  A Little 6 Click Score: 20    End of Session Equipment Utilized During Treatment: Gait belt Activity Tolerance: Patient tolerated treatment well Patient left: in chair;with call bell/phone within reach;with chair alarm set   PT Visit Diagnosis: Difficulty in walking, not elsewhere classified (R26.2);History of falling (Z91.81)     Time: 4315-4008 PT Time Calculation (min) (ACUTE ONLY): 24 min  Charges:  $Gait Training: 23-37 mins                         Faye Ramsay, PT Acute Rehabilitation  Office: [phone] Pager: [phone]

## 2019-10-03 NOTE — Progress Notes (Addendum)
Progress Note  CC:     cirrhosis    Attending physician's note   I have taken an interval history, reviewed the chart and examined the patient. I agree with the Advanced Practitioner's note, impression and recommendations.   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  Will need platelet transfusion if below 20 K prior to procedure  Continue Lactulose for hepatic encephalopathy  K. Scherry Ran , MD [phone]     ASSESSMENT AND PLAN:   # Decompensated cirrhosis ( Etoh +/- fatty liver)  newly diagnosed.  ( MELD 17).  --Tbili 5.4 today. INR better at 1.4. Platelets 23. Renal function normal. No HE on exam  --Ascites: Getting IV albumin / lasix 40 BID / aldactone 100. K+ normal, renal function normal. Weight down 13 pounds in last 3 days but had 8.3 liter LVP on 7/7, no SBP, negative cytology .  Negative fluid balance of 2.7 liters since admission. Will hold off on repeat LVP.  --Will need strict 2 gram sodium diet upon discharge --Encephalopathy:  ammonia 68 on admission. No evidence for HE on exam today. Continue Lactulose TID, Xifaxan. Will need to titrate Lactulose downward if too many BMs ( goal is 2-3 BMs a day) --HCC screening: AFP 5, no mention of liver lesion on contrasted CT scan --Varices screening: Eventual outpatient EGD if thrombocytopenia allows.  --Severe thrombocytopenia / splenomegaly. Followed by Hematology in [place] --Hyponatremia, resolved --Hospital follow up with Mike Gip, PA on 10/31/19 at 9am.   # Anemia. Hg stable at 10.7. No signs of active GI bleeding.   # Generalized ileus. General Surgery evaluated, no surgical intervention. No nausea /  vomiting  # Asymptomatic cholelithiasis    SUBJECTIVE    Abdomen feels full / distended again. No other complaints   OBJECTIVE:     Vital signs in last 24 hours: Temp:  [97.7 F (36.5 C)-98.4 F (36.9 C)] 97.9 F (36.6 C) (07/12 0517) Pulse Rate:  [94-97] 97 (07/12 0517) Resp:  [19-20] 19 (07/12 0517) BP: (108-143)/(61-70) 143/70 (07/12 0517) SpO2:  [95 %-98 %] 95 % (07/12 0517) Weight:  [126.9 kg] 126.9 kg (07/12 0500) Last BM Date: 10/02/19 General:   Alert, in NAD Heart:  Regular rate and rhythm.   Pulm: Normal respiratory effort. Course breath sounds / wheezing Abdomen:  Soft,  Distended, normal bowel sounds. Non tender. Normal bowel sounds.          Neurologic:  Alert and  oriented,  grossly normal neurologically. No asterixis Psych:  Pleasant, cooperative.  Normal mood and affect.   Intake/Output from previous day: 07/11 0701 - 07/12 0700 In: 1988.6 [P.O.:1680; IV Piggyback:308.6] Out: 4050 [Urine:4050] Intake/Output this shift: Total I/O In: 480 [P.O.:480] Out: 300 [Urine:300]  Lab Results: Recent Labs    10/01/19 0517 10/02/19 0516 10/03/19 0522  WBC 3.9* 3.4* 3.9*  HGB 10.9* 10.6* 10.7*  HCT 33.3* 32.2* 32.0*  PLT 23* 23* 27*   BMET Recent Labs    10/01/19 0517 10/02/19 0516 10/03/19 0522  NA 132* 139 143  K 3.5 3.4* 4.1  CL 100 100 105  CO2 25 28 27   GLUCOSE 94 94 87  BUN 21* 19 15  CREATININE 0.91 0.98 0.91  CALCIUM 8.0* 8.9 9.0   LFT Recent Labs    10/03/19 0522  PROT 5.8*  ALBUMIN 4.0  AST 64*  ALT 26  ALKPHOS 82  BILITOT 5.4*   PT/INR Recent Labs    10/02/19 0516 10/03/19 0522  LABPROT 18.6* 17.0*  INR 1.6* 1.4*   Hepatitis Panel No results for input(s): HEPBSAG, HCVAB, HEPAIGM, HEPBIGM in the last 72 hours.  ECHOCARDIOGRAM COMPLETE  Result Date: 10/02/2019    ECHOCARDIOGRAM REPORT   Patient Name:   Jessica Cline Date of Exam: 10/02/2019 Medical Rec #:  [id number]      Height:       65.0 in Accession #:     [id number]     Weight:       283.8 lb Date of Birth:  1965/06/09      BSA:          2.295 m Patient Age:    54 years       BP:           115/49 mmHg Patient Gender: F              HR:           96 bpm. Exam Location:  Inpatient Procedure: 2D Echo Indications:    CHF-Acute Diastolic I50.31  History:        Patient has no prior history of Echocardiogram examinations.                 Risk Factors:Hypertension.  Sonographer:    Thurman Coyer RDCS (AE) Referring Phys: [id number] [facility] IMPRESSIONS  1. Left ventricular ejection fraction, by estimation, is 60 to 65%. The left ventricle has normal function. The left ventricle has no regional wall motion abnormalities. Left ventricular diastolic parameters were normal.  2. Right ventricular systolic function is normal. The right ventricular size is normal. Tricuspid regurgitation signal is inadequate for assessing PA pressure.  3. The mitral valve is grossly normal. No evidence of mitral valve regurgitation. No evidence of mitral stenosis.  4. The aortic valve is tricuspid. Aortic valve regurgitation is not visualized. No aortic stenosis is present. Conclusion(s)/Recommendation(s): Normal LV function. Normal diastolic function. Ascites present. FINDINGS  Left Ventricle: Left ventricular ejection fraction, by estimation, is 60 to 65%. The left ventricle has normal function. The left ventricle has no regional wall motion abnormalities. The left ventricular internal cavity size was normal in size. There is  no left ventricular hypertrophy. Left ventricular diastolic parameters were normal. Normal left ventricular filling pressure. Right Ventricle: The right ventricular size is normal. No increase in right ventricular wall thickness. Right ventricular systolic function is normal. Tricuspid regurgitation signal is inadequate for assessing PA pressure. Left Atrium: Left atrial size was normal in size. Right Atrium: Right atrial size was normal in size. Pericardium: Trivial  pericardial effusion is present. Mitral Valve: The mitral valve is grossly normal. No evidence of mitral valve regurgitation. No evidence of mitral valve stenosis. Tricuspid Valve: The tricuspid valve is grossly normal. Tricuspid valve regurgitation is trivial. No evidence of tricuspid stenosis. Aortic Valve: The aortic valve is tricuspid. Aortic valve regurgitation is not visualized. No aortic stenosis is present. Pulmonic Valve: The pulmonic valve was grossly normal. Pulmonic valve regurgitation is not visualized. No evidence of pulmonic stenosis. Aorta: The aortic root and ascending aorta are structurally normal, with no evidence of dilitation. Venous: The inferior vena cava was not well visualized. IAS/Shunts: The atrial septum is grossly normal. Additional Comments: Moderate ascites is  present.  LEFT VENTRICLE PLAX 2D LVIDd:         4.50 cm  Diastology LVIDs:         2.80 cm  LV e' lateral:   13.50 cm/s LV PW:         1.00 cm  LV E/e' lateral: 7.8 LV IVS:        0.80 cm  LV e' medial:    9.68 cm/s LVOT diam:     2.00 cm  LV E/e' medial:  10.8 LV SV:         76 LV SV Index:   33 LVOT Area:     3.14 cm  RIGHT VENTRICLE RV S prime:     15.10 cm/s TAPSE (M-mode): 2.2 cm LEFT ATRIUM             Index       RIGHT ATRIUM           Index LA diam:        4.20 cm 1.83 cm/m  RA Area:     15.60 cm LA Vol (A2C):   56.4 ml 24.57 ml/m RA Volume:   35.80 ml  15.60 ml/m LA Vol (A4C):   43.4 ml 18.91 ml/m LA Biplane Vol: 49.5 ml 21.57 ml/m  AORTIC VALVE LVOT Vmax:   135.00 cm/s LVOT Vmean:  88.400 cm/s LVOT VTI:    0.242 m  AORTA Ao Root diam: 3.00 cm Ao Asc diam:  2.80 cm MITRAL VALVE MV Area (PHT): 3.93 cm     SHUNTS MV Decel Time: 193 msec     Systemic VTI:  0.24 m MV E velocity: 105.00 cm/s  Systemic Diam: 2.00 cm MV A velocity: 87.80 cm/s MV E/A ratio:  1.20 Lennie Odor MD Electronically signed by Lennie Odor MD Signature Date/Time: 10/02/2019/2:12:14 PM    Final    US LIVER DOPPLER  Result Date:  10/01/2019 CLINICAL DATA:  Cirrhosis EXAM: DUPLEX ULTRASOUND OF LIVER TECHNIQUE: Color and duplex Doppler ultrasound was performed to evaluate the hepatic in-flow and out-flow vessels. Technologist describes technically difficult study secondary to body habitus and bowel gas. COMPARISON:  CT 09/27/2019 FINDINGS: Liver: Coarse heterogenous echotexture.  Nodular capsular contour. No focal lesion, mass or intrahepatic biliary ductal dilatation. Main Portal Vein size: 1.4 cm Portal Vein Velocities (all hepatopetal): Main Prox:  12 cm/sec Main Mid: 12 cm/sec Main Dist:  17 cm/sec Right: 13 cm/sec Left: 18 cm/sec Hepatic Vein Velocities (all hepatofugal): Right:  27 cm/sec Middle:  24 cm/sec Left:  29 cm/sec IVC: Present and patent with normal respiratory phasicity. 58 cm/sec. Hepatic Artery Velocity: Not visualized Splenic Vein Velocity:  16 cm/sec Spleen: 14.1 cm x 7.2 cm x 16.7 cm with a total volume of 889 cm^3 (411 cm^3 is upper limit normal) Portal Vein Occlusion/Thrombus: None identified, limited evaluation of left portal venous system. Splenic Vein Occlusion/Thrombus: No Ascites: Moderate perihepatic Varices: None identified Common bile duct is 2.9 mm thickness, unremarkable The gallbladder is incompletely distended, measured 1 measured up to 2.6 mm in thickness. 5 mm calculus. No pericholecystic fluid. No Murphy sign described by technologist. IMPRESSION: 1. No vascular abnormality on hepatic Doppler evaluation. 2. Hepatic morphologic changes consistent with cirrhosis, with stigmata of portal venous hypertension. 3. Cholelithiasis without evidence of acute cholecystitis or biliary obstruction. Electronically Signed   By: Corlis Leak M.D.   On: 10/01/2019 12:20    Principal Problem:   SBO (small bowel obstruction) (HCC) Active Problems:   Thrombocytopenia (HCC)  Ascites due to alcoholic cirrhosis (HCC)   Anasarca     LOS: 5 days   Willette ClusterPaula Guenther ,NP 10/03/2019, 9:27 AM

## 2019-10-03 NOTE — Progress Notes (Signed)
PROGRESS NOTE  Jessica Cline EPP:[id number] DOB: [birth year] DOA: 09/27/2019 PCP: [name] Gerome Apley, PA-C   LOS: 5 days   Brief Narrative / Interim history: 54 year old female with history of liver cirrhosis, thrombocytopenia, essential hypertension came into the hospital with chief complaint of fluid overload as well as abdominal pain, nausea.  She is being followed by gastroenterology as an outpatient is believed that she has liver disease due to combination of EtOH cirrhosis/[place] (tells me she quit alcohol into her mid 30s).  Has not had an EGD due to thrombocytopenia.  She is also seeing oncology as an outpatient.  Imaging on admission concerning for small bowel obstruction and general surgery was consulted  Subjective / 24h Interval events: Complains of significant anxiety this morning.  Assessment & Plan: Principal Problem Decompensated liver cirrhosis with ascites, splenomegaly, thrombocytopenia-new onset of ascites and significant fluid overload.  Gastroenterology consulted and following.  Diuresing well, weight is down about 12 pounds, continue.  Discussed with gastroenterology this morning, they do want to get an inpatient endoscopy due to severe thrombocytopenia on Wednesday morning.  Goal platelets is greater than 20 K.  Will ensure that platelets are checked at 5 AM and then if they are less than 20 she will be given a unit prior to her a.m. endoscopy.  Active Problems Small bowel obstruction /adynamic ileus-this is likely in the setting of significant ascites, improving.  General surgery was initially consulted, signed off on 7/8.  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  She is not having significant increase in her urinary output off DDAVP, sodium is stable and she is in fact slightly hyponatremic in the setting of fluid overload, will discontinue DDAVP for now as she does not fit clinical criteria   Scheduled Meds: . furosemide  40 mg Oral BID  . lactulose  10 g Oral TID  . rifaximin  550 mg Oral BID  . sertraline  200 mg Oral Daily  . spironolactone  100 mg Oral BID  . traZODone  200 mg Oral QHS   Continuous Infusions: . sodium chloride 10 mL/hr at 10/03/19 0840  . albumin human 25 g (10/03/19 1215)   PRN Meds:.clonazePAM, ipratropium-albuterol, ondansetron **OR** ondansetron (ZOFRAN) IV  Diet Orders (From admission, onward)    Start     Ordered   09/28/19 1258  Diet full liquid Room service appropriate? Yes; Fluid consistency: Thin  Diet effective now       Comments: 2 gram sodium  Question Answer Comment  Room service appropriate? Yes   Fluid consistency: Thin      09/28/19 1300          DVT prophylaxis: SCDs Start: 09/27/19 2046     Code Status: Full Code  Family Communication: no family at bedside   Status is: Inpatient  Remains inpatient: Significant fluid overload, persistent  Dispo: The patient is from: Home              Anticipated d/c is to: Home              Anticipated d/c date is: 3 days  Patient currently is not medically stable to d/c.  Consultants:  General surgery  GI  Procedures:  Paracentesis 7/7  Microbiology  none  Antimicrobials: none    Objective: Vitals:   10/02/19 1327 10/02/19 2054 10/03/19 0500 10/03/19 0517  BP: 124/63 108/61  (!) 143/70  Pulse: 95 94  97  Resp: 20 19  19   Temp: 97.7 F (36.5 C) 98.4 F (36.9 C)  97.9 F (36.6 C)  TempSrc: Oral Oral  Oral  SpO2: 98% 96%  95%  Weight:   126.9 kg   Height:        Intake/Output Summary (Last 24 hours) at 10/03/2019 1238 Last data  filed at 10/03/2019 1129 Gross per 24 hour  Intake 2108.59 ml  Output 3925 ml  Net -1816.41 ml   Filed Weights   10/02/19 0500 10/02/19 0822 10/03/19 0500  Weight: 130 kg 128.7 kg 126.9 kg    Examination:  Constitutional: No distress Eyes: No scleral icterus ENMT: Moist mucous membranes Neck: normal, supple Respiratory: Coarse breath sounds throughout Cardiovascular: Regular rate and rhythm, no murmurs rubs or gallops, 2+ edema Abdomen: Distended, nontender, bowel sounds positive Musculoskeletal: no clubbing / cyanosis.  Skin: No rashes appreciated Neurologic: No focal deficits, equal strength   Data Reviewed: I have independently reviewed following labs and imaging studies   CBC: Recent Labs  Lab 09/27/19 1700 09/28/19 0434 09/29/19 0504 09/30/19 0436 10/01/19 0517 10/02/19 0516 10/03/19 0522  WBC 8.1   < > 5.7 5.0 3.9* 3.4* 3.9*  NEUTROABS 6.3  --   --   --   --   --   --   HGB 12.9   < > 11.2* 11.2* 10.9* 10.6* 10.7*  HCT 39.7   < > 35.1* 33.6* 33.3* 32.2* 32.0*  MCV 92.8   < > 94.4 93.1 93.3 94.4 96.7  PLT 37*   < > 27* 24* 23* 23* 27*   < > = values in this interval not displayed.   Basic Metabolic Panel: Recent Labs  Lab 09/29/19 0504 09/30/19 0436 10/01/19 0517 10/02/19 0516 10/03/19 0522  NA 134* 133* 132* 139 143  K 3.8 3.7 3.5 3.4* 4.1  CL 99 100 100 100 105  CO2 24 24 25 28 27   GLUCOSE 79 103* 94 94 87  BUN 26* 27* 21* 19 15  CREATININE 1.02* 0.91 0.91 0.98 0.91  CALCIUM 8.3* 8.3* 8.0* 8.9 9.0  MG  --  1.8  --   --  1.7  PHOS  --  3.1  --   --  2.7   Liver Function Tests: Recent Labs  Lab 09/27/19 1700 09/28/19 0434 09/29/19 0504 09/30/19 0436 10/03/19 0522  AST 88* 62* 61* 60* 64*  ALT 47* 38 34 32 26  ALKPHOS 166* 132* 123 122 82  BILITOT 5.0* 3.9* 3.7* 3.8* 5.4*  PROT 6.6 5.7* 5.2* 5.3* 5.8*  ALBUMIN 3.5 2.9* 2.7* 2.7* 4.0   Coagulation Profile: Recent Labs  Lab 09/27/19 2025 09/30/19 0436 10/02/19 0516 10/03/19 0522    INR 1.3* 1.4* 1.6* 1.4*   HbA1C: No results for input(s): HGBA1C in the last 72 hours. CBG: No results for input(s): GLUCAP in the last 168 hours.  Recent Results (from the past 240 hour(s))  SARS Coronavirus 2 by RT PCR (hospital order, performed in [facility] hospital lab) Nasopharyngeal Nasopharyngeal Swab     Status: None   Collection Time: 09/27/19  8:30 PM   Specimen: Nasopharyngeal Swab  Result Value Ref Range Status  SARS Coronavirus 2 NEGATIVE NEGATIVE Final    Comment: (NOTE) SARS-CoV-2 target nucleic acids are NOT DETECTED.  The SARS-CoV-2 RNA is generally detectable in upper and lower respiratory specimens during the acute phase of infection. The lowest concentration of SARS-CoV-2 viral copies this assay can detect is 250 copies / mL. A negative result does not preclude SARS-CoV-2 infection and should not be used as the sole basis for treatment or other patient management decisions.  A negative result may occur with improper specimen collection / handling, submission of specimen other than nasopharyngeal swab, presence of viral mutation(s) within the areas targeted by this assay, and inadequate number of viral copies (<250 copies / mL). A negative result must be combined with clinical observations, patient history, and epidemiological information.  Fact Sheet for Patients:   BoilerBrush.com.cy  Fact Sheet for Healthcare Providers: https://pope.com/  This test is not yet approved or  cleared by the [place] FDA and has been authorized for detection and/or diagnosis of SARS-CoV-2 by FDA under an Emergency Use Authorization (EUA).  This EUA will remain in effect (meaning this test can be used) for the duration of the COVID-19 declaration under Section 564(b)(1) of the Act, 21 U.S.C. section 360bbb-3(b)(1), unless the authorization is terminated or revoked sooner.  Performed at [facility],  [address] [place], [place] [address]   Body fluid culture     Status: None   Collection Time: 09/28/19 10:55 AM   Specimen: PATH Cytology Peritoneal fluid  Result Value Ref Range Status   Specimen Description   Final    CYTO PERI Performed at [facility], [address] [place], [place] [address]    Special Requests   Final    NONE Performed at [facility], [address], [place], [place] [address]    Gram Stain   Final    RARE WBC PRESENT, PREDOMINANTLY MONONUCLEAR NO ORGANISMS SEEN    Culture   Final    NO GROWTH Performed at [facility], [address], [place], [place] [address]    Report Status 10/01/2019 FINAL  Final     Radiology Studies: No results found.  Pamella Pert, MD, PhD [facility]  Between 7 am - 7 pm I am available, please contact me via Amion or Securechat  Between 7 pm - 7 am I am not available, please contact night coverage MD/APP via Amion

## 2019-10-03 NOTE — Care Management Important Message (Signed)
Important Message  Patient Details IM Letter given to Vivi Barrack SW Case Manager to present to the Patient Name: DIANAH PRUETT MRN: [id number] Date of Birth: May 14, 1965   Medicare Important Message Given:  Yes     Caren Macadam 10/03/2019, 12:43 PM

## 2019-10-03 NOTE — Progress Notes (Signed)
Nutrition Education Note  RD consulted for nutrition education regarding a low sodium diet.   Patient states she had a family member with heart problems so she has never added salt to her foods. Tries to flavor foods with pepper and other spices.   RD provided "Low Sodium Nutrition Therapy" handout from the Academy of Nutrition and Dietetics. Reviewed patient's dietary recall. Provided examples on ways to decrease sodium and fat intake in diet. Discouraged intake of processed foods and use of salt shaker. Encouraged fresh fruits and vegetables as well as whole grain sources of carbohydrates to maximize fiber intake. Teach back method used.  Expect fair compliance. Pt had a hard time concentrating on our conversation. Handout was given in-person and more information was placed in discharge instructions. Pt didn't have many questions and then requested nurse to help her to the bathroom.  Body mass index is 46.56 kg/m. Pt meets criteria for morbid obesity based on current BMI.  Current diet order is full liquid, patient is consuming approximately 100% of meals at this time. Labs and medications reviewed. No further nutrition interventions warranted at this time.  If additional nutrition issues arise, please re-consult RD.  Tilda Franco, MS, RD, LDN Inpatient Clinical Dietitian Contact information available via Amion

## 2019-10-04 ENCOUNTER — Ambulatory Visit: Payer: Medicare Other | Admitting: Gastroenterology

## 2019-10-04 LAB — CBC
HCT: 32.5 % — ABNORMAL LOW (ref 36.0–46.0)
Hemoglobin: 10.6 g/dL — ABNORMAL LOW (ref 12.0–15.0)
MCH: 31.5 pg (ref 26.0–34.0)
MCHC: 32.6 g/dL (ref 30.0–36.0)
MCV: 96.7 fL (ref 80.0–100.0)
Platelets: 23 10*3/uL — CL (ref 150–400)
RBC: 3.36 MIL/uL — ABNORMAL LOW (ref 3.87–5.11)
RDW: 16.7 % — ABNORMAL HIGH (ref 11.5–15.5)
WBC: 3.7 10*3/uL — ABNORMAL LOW (ref 4.0–10.5)
nRBC: 0 % (ref 0.0–0.2)

## 2019-10-04 LAB — BASIC METABOLIC PANEL
Anion gap: 14 (ref 5–15)
BUN: 12 mg/dL (ref 6–20)
CO2: 27 mmol/L (ref 22–32)
Calcium: 9.3 mg/dL (ref 8.9–10.3)
Chloride: 103 mmol/L (ref 98–111)
Creatinine, Ser: 0.95 mg/dL (ref 0.44–1.00)
GFR calc Af Amer: >60 mL/min (ref >60)
GFR calc non Af Amer: >60 mL/min (ref >60)
Glucose, Bld: 99 mg/dL (ref 70–99)
Potassium: 3.6 mmol/L (ref 3.5–5.1)
Sodium: 144 mmol/L (ref 135–145)

## 2019-10-04 MED ORDER — POTASSIUM CHLORIDE CRYS ER 20 MEQ PO TBCR
40.0000 meq | EXTENDED_RELEASE_TABLET | Freq: Once | ORAL | Status: AC
  Administered 2019-10-04: 40 meq via ORAL
  Filled 2019-10-04: qty 2

## 2019-10-04 NOTE — H&P (View-Only) (Signed)
Progress Note  CC:  cirrhosis        ASSESSMENT AND PLAN:   # Decompensated cirrhosis ( ?Etoh +/- fatty liver)  newly diagnosed.  ( MELD 17).  --Platelets 23.  Severe thrombocytopenia / splenomegaly. Followed by Hematology in [place] --Ascites: 8.3 liter LVP on 7/7, no SBP, negative cytology . Getting IV albumin / lasix 40 BID / aldactone 100. K+ normal, renal function normal. Weight down 5 pounds overnight. Ur output 3800 yesterday. Negative fluid balance of 5. 3 liters since admission. Will hold off on repeat LVP, abdomen is not tense and she is diuresing.  --Will need strict 2 gram sodium diet upon discharge --Encephalopathy:  ammonia 68 on admission. No evidence for HE on exam.  Continue Lactulose TID. Probably won't be able to  get Xifaxan as outpatient.  Will need to titrate Lactulose downward if too many BMs ( goal is 2-3 BMs a day) --HCC screening: AFP 5, no mention of liver lesion on contrasted CT scan --Varices screening: will are planning on am EGD for varices screening. CBC in am, okay to proceed if platelets 20K or greater.   --Hospital follow up with Mike Gip, PA on 10/31/19 at 9am.   # Anemia. Hg stable at 10.3. No signs of active GI bleeding.  # Generalized ileus, resolving clinically. General Surgery evaluated, no surgical intervention. No nausea / vomiting  # Asymptomatic cholelithiasis       SUBJECTIVE   Feels okay, some mild generalized abdominal discomfort. We talked about EGD for tomorrow.     OBJECTIVE:     Vital signs in last 24 hours: Temp:  [97.7 F (36.5 C)-98.6 F (37 C)] 97.7 F (36.5 C) (07/13 0525) Pulse Rate:  [89-98] 89 (07/13 0525) Resp:  [16] 16 (07/13 0525) BP: (131-157)/(57-78) 131/57 (07/13 0525) SpO2:  [92 %-99 %] 92 % (07/13 0525) Weight:  [124.5 kg] 124.5 kg (07/13 0858) Last BM Date: 10/03/19 General:   Alert, in NAD in bedside chair.  Heart:  Regular rate and rhythm, 1+ BLE edema.   Pulm: Normal respiratory  effort   Abdomen:  Soft,  Obese, moderate upper abdominal distention. Normal bowel sounds.          Neurologic:  Alert and  oriented,  grossly normal neurologically. Psych:  Pleasant, cooperative.  Normal mood and affect.   Intake/Output from previous day: 07/12 0701 - 07/13 0700 In: 1234.9 [P.O.:1080; I.V.:37.8; IV Piggyback:117.1] Out: 3800 [Urine:3800] Intake/Output this shift: Total I/O In: 360 [P.O.:360] Out: 400 [Urine:400]  Lab Results: Recent Labs    10/02/19 0516 10/03/19 0522 10/04/19 0536  WBC 3.4* 3.9* 3.7*  HGB 10.6* 10.7* 10.6*  HCT 32.2* 32.0* 32.5*  PLT 23* 27* 23*   BMET Recent Labs    10/02/19 0516 10/03/19 0522 10/04/19 0536  NA 139 143 144  K 3.4* 4.1 3.6  CL 100 105 103  CO2 28 27 27   GLUCOSE 94 87 99  BUN 19 15 12   CREATININE 0.98 0.91 0.95  CALCIUM 8.9 9.0 9.3   LFT Recent Labs    10/03/19 0522  PROT 5.8*  ALBUMIN 4.0  AST 64*  ALT 26  ALKPHOS 82  BILITOT 5.4*   PT/INR Recent Labs    10/02/19 0516 10/03/19 0522  LABPROT 18.6* 17.0*  INR 1.6* 1.4*   Hepatitis Panel No results for input(s): HEPBSAG, HCVAB, HEPAIGM, HEPBIGM in the last 72 hours.  ECHOCARDIOGRAM COMPLETE  Result Date: 10/02/2019    ECHOCARDIOGRAM REPORT  Patient Name:   Jessica Cline Date of Exam: 10/02/2019 Medical Rec #:  [id number]      Height:       65.0 in Accession #:    [id number]     Weight:       283.8 lb Date of Birth:  05/11/1965      BSA:          2.295 m Patient Age:    54 years       BP:           115/49 mmHg Patient Gender: F              HR:           96 bpm. Exam Location:  Inpatient Procedure: 2D Echo Indications:    CHF-Acute Diastolic I50.31  History:        Patient has no prior history of Echocardiogram examinations.                 Risk Factors:Hypertension.  Sonographer:    Casey Kirkpatrick RDCS (AE) Referring Phys: [id number] COSTIN M GHERGHE IMPRESSIONS  1. Left ventricular ejection fraction, by estimation, is 60 to 65%. The left ventricle has  normal function. The left ventricle has no regional wall motion abnormalities. Left ventricular diastolic parameters were normal.  2. Right ventricular systolic function is normal. The right ventricular size is normal. Tricuspid regurgitation signal is inadequate for assessing PA pressure.  3. The mitral valve is grossly normal. No evidence of mitral valve regurgitation. No evidence of mitral stenosis.  4. The aortic valve is tricuspid. Aortic valve regurgitation is not visualized. No aortic stenosis is present. Conclusion(s)/Recommendation(s): Normal LV function. Normal diastolic function. Ascites present. FINDINGS  Left Ventricle: Left ventricular ejection fraction, by estimation, is 60 to 65%. The left ventricle has normal function. The left ventricle has no regional wall motion abnormalities. The left ventricular internal cavity size was normal in size. There is  no left ventricular hypertrophy. Left ventricular diastolic parameters were normal. Normal left ventricular filling pressure. Right Ventricle: The right ventricular size is normal. No increase in right ventricular wall thickness. Right ventricular systolic function is normal. Tricuspid regurgitation signal is inadequate for assessing PA pressure. Left Atrium: Left atrial size was normal in size. Right Atrium: Right atrial size was normal in size. Pericardium: Trivial pericardial effusion is present. Mitral Valve: The mitral valve is grossly normal. No evidence of mitral valve regurgitation. No evidence of mitral valve stenosis. Tricuspid Valve: The tricuspid valve is grossly normal. Tricuspid valve regurgitation is trivial. No evidence of tricuspid stenosis. Aortic Valve: The aortic valve is tricuspid. Aortic valve regurgitation is not visualized. No aortic stenosis is present. Pulmonic Valve: The pulmonic valve was grossly normal. Pulmonic valve regurgitation is not visualized. No evidence of pulmonic stenosis. Aorta: The aortic root and ascending  aorta are structurally normal, with no evidence of dilitation. Venous: The inferior vena cava was not well visualized. IAS/Shunts: The atrial septum is grossly normal. Additional Comments: Moderate ascites is present.  LEFT VENTRICLE PLAX 2D LVIDd:         4.50 cm  Diastology LVIDs:         2.80 cm  LV e' lateral:   13.50 cm/s LV PW:         1.00 cm  LV E/e' lateral: 7.8 LV IVS:        0.80 cm  LV e' medial:    9.68 cm/s LVOT diam:     2.00 cm    LV E/e' medial:  10.8 LV SV:         76 LV SV Index:   33 LVOT Area:     3.14 cm  RIGHT VENTRICLE RV S prime:     15.10 cm/s TAPSE (M-mode): 2.2 cm LEFT ATRIUM             Index       RIGHT ATRIUM           Index LA diam:        4.20 cm 1.83 cm/m  RA Area:     15.60 cm LA Vol (A2C):   56.4 ml 24.57 ml/m RA Volume:   35.80 ml  15.60 ml/m LA Vol (A4C):   43.4 ml 18.91 ml/m LA Biplane Vol: 49.5 ml 21.57 ml/m  AORTIC VALVE LVOT Vmax:   135.00 cm/s LVOT Vmean:  88.400 cm/s LVOT VTI:    0.242 m  AORTA Ao Root diam: 3.00 cm Ao Asc diam:  2.80 cm MITRAL VALVE MV Area (PHT): 3.93 cm     SHUNTS MV Decel Time: 193 msec     Systemic VTI:  0.24 m MV E velocity: 105.00 cm/s  Systemic Diam: 2.00 cm MV A velocity: 87.80 cm/s MV E/A ratio:  1.20 Lennie Odor MD Electronically signed by Lennie Odor MD Signature Date/Time: 10/02/2019/2:12:14 PM    Final     Principal Problem:   SBO (small bowel obstruction) (HCC) Active Problems:   Thrombocytopenia (HCC)   Liver cirrhosis secondary to NASH (HCC)   Ascites due to alcoholic cirrhosis (HCC)   Anasarca     LOS: 6 days   Willette Cluster ,NP 10/04/2019, 9:52 AM

## 2019-10-04 NOTE — Progress Notes (Signed)
Progress Note  CC:  cirrhosis        ASSESSMENT AND PLAN:   # Decompensated cirrhosis ( ?Etoh +/- fatty liver)  newly diagnosed.  ( MELD 17).  --Platelets 23.  Severe thrombocytopenia / splenomegaly. Followed by Hematology in [place] --Ascites: 8.3 liter LVP on 7/7, no SBP, negative cytology . Getting IV albumin / lasix 40 BID / aldactone 100. K+ normal, renal function normal. Weight down 5 pounds overnight. Ur output 3800 yesterday. Negative fluid balance of 5. 3 liters since admission. Will hold off on repeat LVP, abdomen is not tense and she is diuresing.  --Will need strict 2 gram sodium diet upon discharge --Encephalopathy:  ammonia 68 on admission. No evidence for HE on exam.  Continue Lactulose TID. Probably won't be able to  get Xifaxan as outpatient.  Will need to titrate Lactulose downward if too many BMs ( goal is 2-3 BMs a day) --HCC screening: AFP 5, no mention of liver lesion on contrasted CT scan --Varices screening: will are planning on am EGD for varices screening. CBC in am, okay to proceed if platelets 20K or greater.   --Hospital follow up with Mike Gip, PA on 10/31/19 at 9am.   # Anemia. Hg stable at 10.3. No signs of active GI bleeding.  # Generalized ileus, resolving clinically. General Surgery evaluated, no surgical intervention. No nausea / vomiting  # Asymptomatic cholelithiasis       SUBJECTIVE   Feels okay, some mild generalized abdominal discomfort. We talked about EGD for tomorrow.     OBJECTIVE:     Vital signs in last 24 hours: Temp:  [97.7 F (36.5 C)-98.6 F (37 C)] 97.7 F (36.5 C) (07/13 0525) Pulse Rate:  [89-98] 89 (07/13 0525) Resp:  [16] 16 (07/13 0525) BP: (131-157)/(57-78) 131/57 (07/13 0525) SpO2:  [92 %-99 %] 92 % (07/13 0525) Weight:  [124.5 kg] 124.5 kg (07/13 0858) Last BM Date: 10/03/19 General:   Alert, in NAD in bedside chair.  Heart:  Regular rate and rhythm, 1+ BLE edema.   Pulm: Normal respiratory  effort   Abdomen:  Soft,  Obese, moderate upper abdominal distention. Normal bowel sounds.          Neurologic:  Alert and  oriented,  grossly normal neurologically. Psych:  Pleasant, cooperative.  Normal mood and affect.   Intake/Output from previous day: 07/12 0701 - 07/13 0700 In: 1234.9 [P.O.:1080; I.V.:37.8; IV Piggyback:117.1] Out: 3800 [Urine:3800] Intake/Output this shift: Total I/O In: 360 [P.O.:360] Out: 400 [Urine:400]  Lab Results: Recent Labs    10/02/19 0516 10/03/19 0522 10/04/19 0536  WBC 3.4* 3.9* 3.7*  HGB 10.6* 10.7* 10.6*  HCT 32.2* 32.0* 32.5*  PLT 23* 27* 23*   BMET Recent Labs    10/02/19 0516 10/03/19 0522 10/04/19 0536  NA 139 143 144  K 3.4* 4.1 3.6  CL 100 105 103  CO2 28 27 27   GLUCOSE 94 87 99  BUN 19 15 12   CREATININE 0.98 0.91 0.95  CALCIUM 8.9 9.0 9.3   LFT Recent Labs    10/03/19 0522  PROT 5.8*  ALBUMIN 4.0  AST 64*  ALT 26  ALKPHOS 82  BILITOT 5.4*   PT/INR Recent Labs    10/02/19 0516 10/03/19 0522  LABPROT 18.6* 17.0*  INR 1.6* 1.4*   Hepatitis Panel No results for input(s): HEPBSAG, HCVAB, HEPAIGM, HEPBIGM in the last 72 hours.  ECHOCARDIOGRAM COMPLETE  Result Date: 10/02/2019    ECHOCARDIOGRAM REPORT  Patient Name:   TRINETTE VERA Date of Exam: 10/02/2019 Medical Rec #:  [id number]      Height:       65.0 in Accession #:    [id number]     Weight:       283.8 lb Date of Birth:  [birth year]      BSA:          2.295 m Patient Age:    54 years       BP:           115/49 mmHg Patient Gender: F              HR:           96 bpm. Exam Location:  Inpatient Procedure: 2D Echo Indications:    CHF-Acute Diastolic I50.31  History:        Patient has no prior history of Echocardiogram examinations.                 Risk Factors:Hypertension.  Sonographer:    Thurman Coyer RDCS (AE) Referring Phys: [id number] Daylene Katayama [facility] IMPRESSIONS  1. Left ventricular ejection fraction, by estimation, is 60 to 65%. The left ventricle has  normal function. The left ventricle has no regional wall motion abnormalities. Left ventricular diastolic parameters were normal.  2. Right ventricular systolic function is normal. The right ventricular size is normal. Tricuspid regurgitation signal is inadequate for assessing PA pressure.  3. The mitral valve is grossly normal. No evidence of mitral valve regurgitation. No evidence of mitral stenosis.  4. The aortic valve is tricuspid. Aortic valve regurgitation is not visualized. No aortic stenosis is present. Conclusion(s)/Recommendation(s): Normal LV function. Normal diastolic function. Ascites present. FINDINGS  Left Ventricle: Left ventricular ejection fraction, by estimation, is 60 to 65%. The left ventricle has normal function. The left ventricle has no regional wall motion abnormalities. The left ventricular internal cavity size was normal in size. There is  no left ventricular hypertrophy. Left ventricular diastolic parameters were normal. Normal left ventricular filling pressure. Right Ventricle: The right ventricular size is normal. No increase in right ventricular wall thickness. Right ventricular systolic function is normal. Tricuspid regurgitation signal is inadequate for assessing PA pressure. Left Atrium: Left atrial size was normal in size. Right Atrium: Right atrial size was normal in size. Pericardium: Trivial pericardial effusion is present. Mitral Valve: The mitral valve is grossly normal. No evidence of mitral valve regurgitation. No evidence of mitral valve stenosis. Tricuspid Valve: The tricuspid valve is grossly normal. Tricuspid valve regurgitation is trivial. No evidence of tricuspid stenosis. Aortic Valve: The aortic valve is tricuspid. Aortic valve regurgitation is not visualized. No aortic stenosis is present. Pulmonic Valve: The pulmonic valve was grossly normal. Pulmonic valve regurgitation is not visualized. No evidence of pulmonic stenosis. Aorta: The aortic root and ascending  aorta are structurally normal, with no evidence of dilitation. Venous: The inferior vena cava was not well visualized. IAS/Shunts: The atrial septum is grossly normal. Additional Comments: Moderate ascites is present.  LEFT VENTRICLE PLAX 2D LVIDd:         4.50 cm  Diastology LVIDs:         2.80 cm  LV e' lateral:   13.50 cm/s LV PW:         1.00 cm  LV E/e' lateral: 7.8 LV IVS:        0.80 cm  LV e' medial:    9.68 cm/s LVOT diam:     2.00 cm  LV E/e' medial:  10.8 LV SV:         76 LV SV Index:   33 LVOT Area:     3.14 cm  RIGHT VENTRICLE RV S prime:     15.10 cm/s TAPSE (M-mode): 2.2 cm LEFT ATRIUM             Index       RIGHT ATRIUM           Index LA diam:        4.20 cm 1.83 cm/m  RA Area:     15.60 cm LA Vol (A2C):   56.4 ml 24.57 ml/m RA Volume:   35.80 ml  15.60 ml/m LA Vol (A4C):   43.4 ml 18.91 ml/m LA Biplane Vol: 49.5 ml 21.57 ml/m  AORTIC VALVE LVOT Vmax:   135.00 cm/s LVOT Vmean:  88.400 cm/s LVOT VTI:    0.242 m  AORTA Ao Root diam: 3.00 cm Ao Asc diam:  2.80 cm MITRAL VALVE MV Area (PHT): 3.93 cm     SHUNTS MV Decel Time: 193 msec     Systemic VTI:  0.24 m MV E velocity: 105.00 cm/s  Systemic Diam: 2.00 cm MV A velocity: 87.80 cm/s MV E/A ratio:  1.20 Lennie Odor MD Electronically signed by Lennie Odor MD Signature Date/Time: 10/02/2019/2:12:14 PM    Final     Principal Problem:   SBO (small bowel obstruction) (HCC) Active Problems:   Thrombocytopenia (HCC)   Liver cirrhosis secondary to NASH (HCC)   Ascites due to alcoholic cirrhosis (HCC)   Anasarca     LOS: 6 days   Willette Cluster ,NP 10/04/2019, 9:52 AM

## 2019-10-04 NOTE — Progress Notes (Signed)
PROGRESS NOTE  Jessica Cline:[id number] DOB: April 11, 1965 DOA: 09/27/2019 PCP: Jessica Cline Jessica Apley, PA-C   LOS: 6 days   Brief Narrative / Interim history: 54 year old female with history of liver cirrhosis, thrombocytopenia, essential hypertension came into the hospital with chief complaint of fluid overload as well as abdominal pain, nausea.  She is being followed by gastroenterology as an outpatient is believed that she has liver disease due to combination of EtOH cirrhosis/[place] (tells me she quit alcohol into her mid 30s).  Has not had an EGD due to thrombocytopenia.  She is also seeing oncology as an outpatient.  Imaging on admission concerning for small bowel obstruction/ileus and general surgery was consulted.  Her ileus has resolved with paracentesis.  GI was consulted due to new onset decompensation of her cirrhosis  Subjective / 24h Interval events: No complaints this morning, still has abdominal swelling but no pain.  Appreciates that her leg swelling is improved.  Assessment & Plan: Principal Problem Decompensated liver cirrhosis with ascites, splenomegaly, thrombocytopenia-new onset of ascites and significant fluid overload.  GI consulted and following.  She is currently being diuresed with IV Lasix and albumin, she has had very good urine output and her weight overall is down about 18 pounds.  That includes her 8 L removed with paracentesis day after admission.  She has profound thrombocytopenia and GI plans for screening EGD in the inpatient setting due to high risk.  EGD scheduled for Wednesday.  Goal platelets for the EGD is greater than 20 K.  Will ensure that platelets are checked at 5 AM and if they are less than 20 she will be given a unit prior to her endoscopy.  Active Problems Small bowel obstruction /adynamic ileus-this is likely in the setting of significant ascites, improving.  General surgery was initially consulted, signed off on 7/8.  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  She is not having significant increase in her urinary output off DDAVP also considering Lasix, sodium is stable despite no use of DDAVP for the past 6 days, diabetes insipidus does not really fit criteria at this point.  She will need outpatient follow-up with endocrinology   Scheduled Meds: . furosemide  40 mg Oral BID  . lactulose  10 g Oral TID  . rifaximin  550 mg Oral BID  . sertraline  200 mg Oral Daily  . spironolactone  100 mg Oral BID  . traZODone  200 mg Oral QHS   Continuous Infusions: . sodium chloride 10 mL/hr at 10/03/19 0840  . albumin human 25 g (10/04/19 0519)   PRN Meds:.clonazepam, ipratropium-albuterol, ondansetron **OR** ondansetron (ZOFRAN) IV  Diet Orders (From admission, onward)    Start     Ordered   10/05/19 0001  Diet NPO time specified Except for: Sips with Meds  Diet effective midnight       Question:  Except for  Answer:  Sips with Meds   10/03/19 1429   09/28/19 1258  Diet full liquid Room service appropriate? Yes; Fluid consistency: Thin  Diet effective now  Comments: 2 gram sodium  Question Answer Comment  Room service appropriate? Yes   Fluid consistency: Thin      09/28/19 1300          DVT prophylaxis: SCDs Start: 09/27/19 2046     Code Status: Full Code  Family Communication: no family at bedside   Status is: Inpatient  Remains inpatient: Significant fluid overload, persistent  Dispo: The patient is from: Home              Anticipated d/c is to: Home              Anticipated d/c date is:  2 days              Patient currently is not medically stable to d/c.  Consultants:  General surgery  GI  Procedures:  Paracentesis 7/7  Microbiology  none  Antimicrobials: none    Objective: Vitals:   10/03/19 1321 10/03/19 2130 10/04/19 0525 10/04/19 0858  BP: 135/66 (!) 157/78 (!) 131/57   Pulse: 98 98 89   Resp:  16 16   Temp: 98.1 F (36.7 C) 98.6 F (37 C) 97.7 F (36.5 C)   TempSrc: Oral Oral Oral   SpO2: 99% 94% 92%   Weight:    124.5 kg  Height:        Intake/Output Summary (Last 24 hours) at 10/04/2019 0944 Last data filed at 10/04/2019 0828 Gross per 24 hour  Intake 753 ml  Output 3900 ml  Net -3147 ml   Filed Weights   10/02/19 0822 10/03/19 0500 10/04/19 0858  Weight: 128.7 kg 126.9 kg 124.5 kg    Examination:  Constitutional: No distress, in bed Eyes: No icterus ENMT: Moist mucous membranes Neck: normal, supple Respiratory: Coarse breath sounds throughout, unchanged, chronic Cardiovascular: Regular rate and rhythm, no murmurs rubs or gallops, 2+ edema Abdomen: Distended, nontender, bowel sounds positive Musculoskeletal: no clubbing / cyanosis.  Skin: No rashes seen Neurologic: Nonfocal, equal strength   Data Reviewed: I have independently reviewed following labs and imaging studies   CBC: Recent Labs  Lab 09/27/19 1700 09/28/19 0434 09/30/19 0436 10/01/19 0517 10/02/19 0516 10/03/19 0522 10/04/19 0536  WBC 8.1   < > 5.0 3.9* 3.4* 3.9* 3.7*  NEUTROABS 6.3  --   --   --   --   --   --   HGB 12.9   < > 11.2* 10.9* 10.6* 10.7* 10.6*  HCT 39.7   < > 33.6* 33.3* 32.2* 32.0* 32.5*  MCV 92.8   < > 93.1 93.3 94.4 96.7 96.7  PLT 37*   < > 24* 23* 23* 27* 23*   < > = values in this interval not displayed.   Basic Metabolic Panel: Recent Labs  Lab 09/30/19 0436 10/01/19 0517 10/02/19 0516 10/03/19 0522 10/04/19 0536  NA 133* 132* 139 143 144  K 3.7 3.5 3.4* 4.1 3.6  CL 100 100 100 105 103  CO2 24 25 28 27 27   GLUCOSE 103* 94  94 87 99  BUN 27* 21* 19 15 12   CREATININE 0.91 0.91 0.98 0.91 0.95  CALCIUM 8.3* 8.0* 8.9 9.0 9.3  MG 1.8  --   --  1.7  --   PHOS 3.1  --   --  2.7  --    Liver Function Tests: Recent Labs  Lab 09/27/19 1700 09/28/19 0434 09/29/19 0504 09/30/19 0436 10/03/19 0522  AST 88* 62* 61* 60* 64*  ALT 47* 38 34 32 26  ALKPHOS 166* 132* 123 122 82  BILITOT 5.0* 3.9* 3.7* 3.8* 5.4*  PROT 6.6 5.7* 5.2* 5.3* 5.8*  ALBUMIN 3.5 2.9* 2.7* 2.7* 4.0   Coagulation Profile: Recent Labs  Lab 09/27/19 2025 09/30/19 0436 10/02/19 0516 10/03/19 0522  INR 1.3* 1.4* 1.6* 1.4*   HbA1C: No results for input(s): HGBA1C in the last 72 hours. CBG: No results for input(s): GLUCAP in the last 168 hours.  Recent Results (from the past 240 hour(s))  SARS Coronavirus 2 by RT PCR (hospital order, performed in [facility] hospital lab) Nasopharyngeal Nasopharyngeal Swab     Status: None   Collection Time: 09/27/19  8:30 PM   Specimen: Nasopharyngeal Swab  Result Value Ref Range Status   SARS Coronavirus 2 NEGATIVE NEGATIVE Final    Comment: (NOTE) SARS-CoV-2 target nucleic acids are NOT DETECTED.  The SARS-CoV-2 RNA is generally detectable in upper and lower respiratory specimens during the acute phase of infection. The lowest concentration of SARS-CoV-2 viral copies this assay can detect is 250 copies / mL. A negative result does not preclude SARS-CoV-2 infection and should not be used as the sole basis for treatment or other patient management decisions.  A negative result may occur with improper specimen collection / handling, submission of specimen other than nasopharyngeal swab, presence of viral mutation(s) within the areas targeted by this assay, and inadequate number of viral copies (<250 copies / mL). A negative result must be combined with clinical observations, patient history, and epidemiological information.  Fact Sheet for Patients:    BoilerBrush.com.cy  Fact Sheet for Healthcare Providers: https://pope.com/  This test is not yet approved or  cleared by the [place] FDA and has been authorized for detection and/or diagnosis of SARS-CoV-2 by FDA under an Emergency Use Authorization (EUA).  This EUA will remain in effect (meaning this test can be used) for the duration of the COVID-19 declaration under Section 564(b)(1) of the Act, 21 U.S.C. section 360bbb-3(b)(1), unless the authorization is terminated or revoked sooner.  Performed at [facility], [address], [place], [place] [address]   Body fluid culture     Status: None   Collection Time: 09/28/19 10:55 AM   Specimen: PATH Cytology Peritoneal fluid  Result Value Ref Range Status   Specimen Description   Final    CYTO PERI Performed at [facility], [address], [place], [place] [address]    Special Requests   Final    NONE Performed at [facility], [address], [place], [place] [address]    Gram Stain   Final    RARE WBC PRESENT, PREDOMINANTLY MONONUCLEAR NO ORGANISMS SEEN    Culture   Final    NO GROWTH Performed at [facility], [address], [place], [place] [address]    Report Status 10/01/2019 FINAL  Final     Radiology Studies: No results found.  Pamella Pert, MD, PhD [facility]  Between 7 am - 7 pm I am available, please contact me via Amion or Securechat  Between 7 pm - 7 am I am not available, please contact night coverage MD/APP via Amion

## 2019-10-04 NOTE — Anesthesia Preprocedure Evaluation (Addendum)
Anesthesia Evaluation  Patient identified by MRN, date of birth, ID band Patient awake    Reviewed: Allergy & Precautions, NPO status , Patient's Chart, lab work & pertinent test results  Airway Mallampati: III  TM Distance: >3 FB Neck ROM: Full    Dental no notable dental hx. (+) Poor Dentition, Dental Advisory Given   Pulmonary neg pulmonary ROS,    Pulmonary exam normal  + wheezing      Cardiovascular hypertension, Normal cardiovascular exam Rhythm:Regular Rate:Normal     Neuro/Psych negative neurological ROS     GI/Hepatic (+) Cirrhosis   ascites  substance abuse  alcohol use,   Endo/Other  Morbid obesity  Renal/GU      Musculoskeletal   Abdominal   Peds  Hematology  (+) Blood dyscrasia, , Thrombocytopenia   Anesthesia Other Findings   Reproductive/Obstetrics                            Anesthesia Physical Anesthesia Plan  ASA: IV  Anesthesia Plan: MAC   Post-op Pain Management:    Induction:   PONV Risk Score and Plan: Treatment may vary due to age or medical condition  Airway Management Planned: Natural Airway and Nasal Cannula  Additional Equipment: None  Intra-op Plan:   Post-operative Plan:   Informed Consent: I have reviewed the patients History and Physical, chart, labs and discussed the procedure including the risks, benefits and alternatives for the proposed anesthesia with the patient or authorized representative who has indicated his/her understanding and acceptance.     Dental advisory given  Plan Discussed with:   Anesthesia Plan Comments:        Anesthesia Quick Evaluation

## 2019-10-05 ENCOUNTER — Encounter (HOSPITAL_COMMUNITY): Payer: Self-pay | Admitting: Internal Medicine

## 2019-10-05 ENCOUNTER — Inpatient Hospital Stay (HOSPITAL_COMMUNITY): Payer: Medicare Other | Admitting: Anesthesiology

## 2019-10-05 ENCOUNTER — Encounter (HOSPITAL_COMMUNITY)
Admission: EM | Disposition: A | Discharge status: Home Health | Payer: Self-pay | Source: Home / Self Care | Attending: Internal Medicine

## 2019-10-05 DIAGNOSIS — I8500 Esophageal varices without bleeding: Secondary | ICD-10-CM

## 2019-10-05 DIAGNOSIS — I8510 Secondary esophageal varices without bleeding: Secondary | ICD-10-CM

## 2019-10-05 HISTORY — PX: ESOPHAGOGASTRODUODENOSCOPY (EGD) WITH PROPOFOL: SHX5813

## 2019-10-05 LAB — BASIC METABOLIC PANEL
Anion gap: 12 (ref 5–15)
BUN: 12 mg/dL (ref 6–20)
CO2: 26 mmol/L (ref 22–32)
Calcium: 9.5 mg/dL (ref 8.9–10.3)
Chloride: 105 mmol/L (ref 98–111)
Creatinine, Ser: 0.92 mg/dL (ref 0.44–1.00)
GFR calc Af Amer: >60 mL/min (ref >60)
GFR calc non Af Amer: >60 mL/min (ref >60)
Glucose, Bld: 100 mg/dL — ABNORMAL HIGH (ref 70–99)
Potassium: 4.1 mmol/L (ref 3.5–5.1)
Sodium: 143 mmol/L (ref 135–145)

## 2019-10-05 LAB — CBC
HCT: 31.8 % — ABNORMAL LOW (ref 36.0–46.0)
Hemoglobin: 10.3 g/dL — ABNORMAL LOW (ref 12.0–15.0)
MCH: 31.1 pg (ref 26.0–34.0)
MCHC: 32.4 g/dL (ref 30.0–36.0)
MCV: 96.1 fL (ref 80.0–100.0)
Platelets: 21 10*3/uL — CL (ref 150–400)
RBC: 3.31 MIL/uL — ABNORMAL LOW (ref 3.87–5.11)
RDW: 17 % — ABNORMAL HIGH (ref 11.5–15.5)
WBC: 4.6 10*3/uL (ref 4.0–10.5)
nRBC: 0 % (ref 0.0–0.2)

## 2019-10-05 LAB — ALBUMIN: Albumin: 4.7 g/dL (ref 3.5–5.0)

## 2019-10-05 SURGERY — ESOPHAGOGASTRODUODENOSCOPY (EGD) WITH PROPOFOL
Anesthesia: Monitor Anesthesia Care

## 2019-10-05 MED ORDER — ALBUTEROL SULFATE (2.5 MG/3ML) 0.083% IN NEBU
INHALATION_SOLUTION | RESPIRATORY_TRACT | Status: AC
  Filled 2019-10-05: qty 3

## 2019-10-05 MED ORDER — LACTATED RINGERS IV SOLN: INTRAVENOUS | Status: DC

## 2019-10-05 MED ORDER — PROPOFOL 500 MG/50ML IV EMUL
INTRAVENOUS | Status: DC | PRN
  Administered 2019-10-05: 75 ug/kg/min via INTRAVENOUS
  Administered 2019-10-05: 50 mg via INTRAVENOUS

## 2019-10-05 MED ORDER — ALBUTEROL SULFATE (2.5 MG/3ML) 0.083% IN NEBU
2.5000 mg | INHALATION_SOLUTION | Freq: Once | RESPIRATORY_TRACT | Status: AC
  Administered 2019-10-05: 2.5 mg via RESPIRATORY_TRACT

## 2019-10-05 MED ORDER — PROPOFOL 1000 MG/100ML IV EMUL
INTRAVENOUS | Status: AC
  Filled 2019-10-05: qty 200

## 2019-10-05 SURGICAL SUPPLY — 15 items

## 2019-10-05 NOTE — Anesthesia Postprocedure Evaluation (Signed)
Anesthesia Post Note  Patient: Tarri L Olarte  Procedure(s) Performed: ESOPHAGOGASTRODUODENOSCOPY (EGD) WITH PROPOFOL (N/A )     Patient location during evaluation: Endoscopy Anesthesia Type: MAC Level of consciousness: awake and alert Pain management: pain level controlled Vital Signs Assessment: post-procedure vital signs reviewed and stable Respiratory status: spontaneous breathing, nonlabored ventilation, respiratory function stable and patient connected to nasal cannula oxygen Cardiovascular status: blood pressure returned to baseline and stable Postop Assessment: no apparent nausea or vomiting Anesthetic complications: no   No complications documented.  Last Vitals:  Vitals:   10/05/19 1103 10/05/19 1110  BP: (!) 123/38 (!) 124/39  Pulse: 89 89  Resp: 20 17  Temp: 36.7 C   SpO2: 100% 100%    Last Pain:  Vitals:   10/05/19 1110  TempSrc:   PainSc: 0-No pain                 Barnet Glasgow

## 2019-10-05 NOTE — Progress Notes (Addendum)
PROGRESS NOTE    Jessica Cline  LYY:[id number] DOB: 27-Aug-1965 DOA: 09/27/2019 PCP: [name] Gerome Apley, PA-C   Chief Complaint  Patient presents with  . Abdominal Pain    Brief Narrative:  54 year old female with history of liver cirrhosis, thrombocytopenia, essential hypertension came into the hospital with chief complaint of fluid overload as well as abdominal pain, nausea.  She is being followed by gastroenterology as an outpatient is believed that she has liver disease due to combination of EtOH cirrhosis/[place] (tells me she quit alcohol into her mid 30s).  Has not had an EGD due to thrombocytopenia.  She is also seeing oncology as an outpatient.  Imaging on admission concerning for small bowel obstruction/ileus and general surgery was consulted.  Her ileus has resolved with paracentesis.  GI was consulted due to new onset decompensation of her cirrhosis  Assessment & Plan:   Principal Problem:   SBO (small bowel obstruction) (HCC) Active Problems:   Thrombocytopenia (HCC)   Liver cirrhosis secondary to NASH (HCC)   Ascites due to alcoholic cirrhosis (HCC)   Anasarca   Idiopathic esophageal varices without bleeding (HCC)  Decompensated liver cirrhosis with ascites, splenomegaly, thrombocytopenia new onset of ascites and significant fluid overload Now transitioned to PO lasix/spironolactone Stop albumin today (was 4 g/dl on 1/27)  GI consulted and following.   I/O, daily weights (negative 8.6 L) S/p paracentesis 7/7 with 8.3 L removed S/p EGD on 7/14 due to high risk with thrombocytopenia -> EGD notable for grade 1 and small varices, otherwise normal -> rec full liquid diet, endoscopy in 1 year  Small bowel obstruction /adynamic ileus-this is likely in the setting of significant ascites, improving.  General surgery was initially consulted, signed off on 7/8.  Hypokalemia - resolved  Essential hypertension-Norvasc on hold.  Continue lasix/spironolactone.  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  She is not having significant increase in her urinary output off DDAVP also considering Lasix, sodium is stable despite no use of DDAVP for the past 6 days, diabetes insipidus does not really fit criteria at this point.  She will need outpatient follow-up with endocrinology  DVT prophylaxis: SCD Code Status: full  Family Communication: none at bedside Disposition:   Status is: Inpatient  Remains inpatient appropriate because:IV treatments appropriate due to intensity of illness or inability to take PO   Dispo:  Patient From: Home  Planned Disposition: Home  Expected discharge date: pending, possibly 7/15 pending GI clearance  Medically stable for discharge: No   Consultants:   GI  surgery  Procedures:  EGD 7/14   Echo IMPRESSIONS    1. Left ventricular ejection fraction, by estimation, is 60 to 65%. The  left ventricle has normal function. The left ventricle has no regional  wall motion abnormalities. Left ventricular diastolic parameters were  normal.  2. Right ventricular systolic function is normal. The right ventricular  size is normal. Tricuspid regurgitation signal is inadequate for assessing  PA pressure.  3. The mitral valve is grossly normal. No evidence of mitral valve  regurgitation. No evidence of mitral stenosis.  4. The aortic valve is tricuspid. Aortic valve regurgitation is not  visualized. No aortic stenosis is present.   Conclusion(s)/Recommendation(s): Normal LV function. Normal diastolic  function. Ascites present.   Antimicrobials:  Anti-infectives (From admission, onward)   Start     Dose/Rate Route Frequency Ordered Stop   09/29/19 1400  rifaximin (XIFAXAN) tablet 550 mg      Discontinue     550 mg Oral 2 times daily 09/29/19 1301         Subjective: Seen going to EGD, no complaints  Objective: Vitals:   10/05/19 1019 10/05/19 1103 10/05/19 1110 10/05/19 1326  BP: (!) 142/50 (!) 123/38 (!) 124/39 128/69  Pulse: 90 89 89 95  Resp: 19 20 17 16   Temp: 98.1 F (36.7 C) 98 F (36.7 C)  98 F (36.7 C)  TempSrc: Oral Oral    SpO2: 95% 100% 100% 94%  Weight:      Height:        Intake/Output Summary (Last 24 hours) at 10/05/2019 1338 Last data filed at 10/05/2019 1325 Gross per 24 hour  Intake 1024.51 ml  Output 4200 ml  Net -3175.49 ml   Filed Weights   10/03/19 0500 10/04/19 0858 10/05/19 0643  Weight: 126.9 kg 124.5 kg 124 kg    Examination:  General exam: Appears calm and comfortable  Respiratory system: Clear to auscultation. Respiratory effort normal. Cardiovascular system: S1 & S2 heard, RRR. Gastrointestinal system: Abdomen is mildly distended, soft and nontender. No organomegaly or masses felt. Normal bowel sounds heard. Central nervous system: Alert and oriented. No focal neurological deficits. Extremities: trace edema Skin: No rashes, lesions or ulcers Psychiatry: Judgement and insight appear normal. Mood & affect appropriate.     Data Reviewed: I have personally reviewed following labs and imaging studies  CBC: Recent Labs  Lab 10/01/19 0517 10/02/19 0516 10/03/19 0522 10/04/19 0536 10/05/19 0543  WBC 3.9* 3.4* 3.9* 3.7* 4.6  HGB 10.9* 10.6* 10.7* 10.6* 10.3*  HCT 33.3* 32.2* 32.0* 32.5* 31.8*  MCV 93.3 94.4 96.7 96.7 96.1  PLT 23* 23* 27* 23* 21*    Basic Metabolic Panel: Recent Labs  Lab 09/30/19 0436 09/30/19 0436 10/01/19 0517 10/02/19 0516 10/03/19 0522 10/04/19 0536 10/05/19 0543  NA 133*   < > 132* 139 143 144 143  K 3.7   < > 3.5 3.4* 4.1 3.6 4.1  CL 100   < > 100 100 105 103 105  CO2 24   < > 25 28 27 27 26   GLUCOSE 103*   < > 94 94 87 99 100*  BUN 27*   < > 21* 19 15 12 12   CREATININE 0.91   <  > 0.91 0.98 0.91 0.95 0.92  CALCIUM 8.3*   < > 8.0* 8.9 9.0 9.3 9.5  MG 1.8  --   --   --  1.7  --   --   PHOS 3.1  --   --   --  2.7  --   --    < > = values in this interval not displayed.    GFR: Estimated Creatinine Clearance: 93.6 mL/min (by C-G formula based on SCr of 0.92 mg/dL).  Liver Function Tests: Recent Labs  Lab 09/29/19 0504 09/30/19 0436 10/03/19 0522  AST 61* 60* 64*  ALT 34 32 26  ALKPHOS 123 122 82  BILITOT 3.7* 3.8* 5.4*  PROT 5.2* 5.3* 5.8*  ALBUMIN 2.7* 2.7* 4.0    CBG: No results for input(s): GLUCAP in the last 168 hours.   Recent Results (from the past 240 hour(s))  SARS Coronavirus 2 by RT PCR (hospital order, performed in [facility]  hospital lab) Nasopharyngeal Nasopharyngeal Swab     Status: None   Collection Time: 09/27/19  8:30 PM   Specimen: Nasopharyngeal Swab  Result Value Ref Range Status   SARS Coronavirus 2 NEGATIVE NEGATIVE Final    Comment: (NOTE) SARS-CoV-2 target nucleic acids are NOT DETECTED.  The SARS-CoV-2 RNA is generally detectable in upper and lower respiratory specimens during the acute phase of infection. The lowest concentration of SARS-CoV-2 viral copies this assay can detect is 250 copies / mL. Sarin Comunale negative result does not preclude SARS-CoV-2 infection and should not be used as the sole basis for treatment or other patient management decisions.  Floris Neuhaus negative result may occur with improper specimen collection / handling, submission of specimen other than nasopharyngeal swab, presence of viral mutation(s) within the areas targeted by this assay, and inadequate number of viral copies (<250 copies / mL). Rashi Granier negative result must be combined with clinical observations, patient history, and epidemiological information.  Fact Sheet for Patients:   BoilerBrush.com.cy  Fact Sheet for Healthcare Providers: https://pope.com/  This test is not yet approved or  cleared by the [place] FDA and has been authorized for detection and/or diagnosis of SARS-CoV-2 by FDA under an Emergency Use Authorization (EUA).  This EUA will remain in effect (meaning this test can be used) for the duration of the COVID-19 declaration under Section 564(b)(1) of the Act, 21 U.S.C. section 360bbb-3(b)(1), unless the authorization is terminated or revoked sooner.  Performed at [facility], [address] [place], [place] [address]   Body fluid culture     Status: None   Collection Time: 09/28/19 10:55 AM   Specimen: PATH Cytology Peritoneal fluid  Result Value Ref Range Status   Specimen Description   Final    CYTO PERI Performed at [facility], [address], [place], [place] [address]    Special Requests   Final    NONE Performed at [facility], [address], [place], [place] [address]    Gram Stain   Final    RARE WBC PRESENT, PREDOMINANTLY MONONUCLEAR NO ORGANISMS SEEN    Culture   Final    NO GROWTH Performed at [facility], [address], [place], [place] [address]    Report Status 10/01/2019 FINAL  Final         Radiology Studies: No results found.      Scheduled Meds: . furosemide  40 mg Oral BID  . lactulose  10 g Oral TID  . rifaximin  550 mg Oral BID  . sertraline  200 mg Oral Daily  . spironolactone  100 mg Oral BID  . traZODone  200 mg Oral QHS   Continuous Infusions: . sodium chloride 10 mL/hr at 10/03/19 0840  . lactated ringers 20 mL/hr at 10/05/19 1038     LOS: 7 days    Time spent: over 30 min    Lacretia Nicks, MD [facility]   To contact the attending provider between 7A-7P or the covering provider during after hours 7P-7A, please log into the web site [email or web address] and access using universal Wise password for that web site. If you do not have the password, please call the hospital operator.  10/05/2019, 1:38 PM

## 2019-10-05 NOTE — Op Note (Signed)
[facility] Patient Name: Jessica Cline Procedure Date: 10/05/2019 MRN: [id number] Attending MD: Iva Boop , MD Date of Birth: 1965/09/07 CSN: [id number] Age: 54 Admit Type: Inpatient Procedure:                Upper GI endoscopy Indications:              Cirrhosis rule out esophageal varices Providers:                Iva Boop, MD, Dwain Sarna, RN, Janie                            Billups, Loralie Champagne CRNA Referring MD:              Medicines:                Propofol per Anesthesia, Monitored Anesthesia Care Complications:            No immediate complications. Estimated Blood Loss:     Estimated blood loss: none. Procedure:                Pre-Anesthesia Assessment:                           - Prior to the procedure, a History and Physical                            was performed, and patient medications and                            allergies were reviewed. The patient's tolerance of                            previous anesthesia was also reviewed. The risks                            and benefits of the procedure and the sedation                            options and risks were discussed with the patient.                            All questions were answered, and informed consent                            was obtained. Prior Anticoagulants: The patient has                            taken no previous anticoagulant or antiplatelet                            agents. ASA Grade Assessment: IV - A patient with                            severe systemic disease that is a constant threat  to life. After reviewing the risks and benefits,                            the patient was deemed in satisfactory condition to                            undergo the procedure.                           After obtaining informed consent, the endoscope was                            passed under direct vision. Throughout the                             procedure, the patient's blood pressure, pulse, and                            oxygen saturations were monitored continuously. The                            [id number]-[id number] ([id number]) was introduced through the                            mouth, and advanced to the second part of duodenum.                            The upper GI endoscopy was accomplished without                            difficulty. The patient tolerated the procedure                            well. Scope In: Scope Out: Findings:      Grade I, small (< 5 mm) varices were found in the distal esophagus.       Estimated blood loss: none.      The exam was otherwise without abnormality.      The cardia and gastric fundus were normal on retroflexion. Impression:               - Grade I and small (< 5 mm) esophageal varices.                           - The examination was otherwise normal.                           - No specimens collected. Moderate Sedation:      Not Applicable - Patient had care per Anesthesia. Recommendation:           - Return patient to hospital ward for ongoing care.                           - Full liquid diet.                           -  Continue present medications.                           - Repeat upper endoscopy in 1 year for screening                            purposes.                           - Recall for EGD is with Dr. Orvan Falconer primary                            gastroenterologist Procedure Code(s):        --- Professional ---                           [phone], Esophagogastroduodenoscopy, flexible,                            transoral; diagnostic, including collection of                            specimen(s) by brushing or washing, when performed                            (separate procedure) Diagnosis Code(s):        --- Professional ---                           K74.60, Unspecified cirrhosis of liver                           I85.10, Secondary esophageal varices without                             bleeding CPT copyright 2019 American Medical Association. All rights reserved. The codes documented in this report are preliminary and upon coder review may  be revised to meet current compliance requirements. Iva Boop, MD 10/05/2019 11:14:19 AM This report has been signed electronically. Number of Addenda: 0

## 2019-10-05 NOTE — Progress Notes (Signed)
Physical Therapy Treatment Patient Details Name: Jessica Cline MRN: [id number] DOB: [birth year] Today's Date: 10/05/2019    History of Present Illness 54 year old female with history of liver cirrhosis, thrombocytopenia, essential hypertension came into the hospital with chief complaint of fluid overload as well as abdominal pain, nausea. Dx of SBO.    PT Comments    Assisted OOB to amb in hallway.General Gait Details: too unsteady without walker "my legs are weak".  Assisted with static standing at sink x 8 min to wash pt's hair.  No c/o dizziness.  Session did fatigue pt and required an extended rest break.  Pt plans to D/C back home.      Follow Up Recommendations  Home health PT     Equipment Recommendations  None recommended by PT    Recommendations for Other Services       Precautions / Restrictions Precautions Precautions: Fall    Mobility  Bed Mobility Overal bed mobility: Modified Independent             General bed mobility comments: used bedrail and increased time  Transfers Overall transfer level: Needs assistance Equipment used: Rolling walker (2 wheeled) Transfers: Sit to/from [employer] Sit to Stand: Supervision Stand pivot transfers: Min guard       General transfer comment: VCs hand placement and safety with turns  Ambulation/Gait Ambulation/Gait assistance: Supervision;Min guard Gait Distance (Feet): 220 Feet Assistive device: Rolling walker (2 wheeled) Gait Pattern/deviations: Step-through pattern;Decreased stride length Gait velocity: decreased   General Gait Details: too unsteady without walker "my legs are weak"   Stairs             Wheelchair Mobility    Modified Rankin (Stroke Patients Only)       Balance                                            Cognition Arousal/Alertness: Awake/alert Behavior During Therapy: WFL for tasks assessed/performed Overall Cognitive Status: Within  Functional Limits for tasks assessed                                 General Comments: AxO x 3 motivated      Exercises      General Comments        Pertinent Vitals/Pain Pain Assessment: No/denies pain    Home Living                      Prior Function            PT Goals (current goals can now be found in the care plan section) Progress towards PT goals: Progressing toward goals    Frequency    Min 3X/week      PT Plan Current plan remains appropriate    Co-evaluation              AM-PAC PT "6 Clicks" Mobility   Outcome Measure  Help needed turning from your back to your side while in a flat bed without using bedrails?: None Help needed moving from lying on your back to sitting on the side of a flat bed without using bedrails?: None Help needed moving to and from a bed to a chair (including a wheelchair)?: A Little Help needed standing up from a chair using your arms (  e.g., wheelchair or bedside chair)?: A Little Help needed to walk in hospital room?: A Little Help needed climbing 3-5 steps with a railing? : A Little 6 Click Score: 20    End of Session Equipment Utilized During Treatment: Gait belt Activity Tolerance: Patient tolerated treatment well Patient left: in bed;with bed alarm set Nurse Communication: Mobility status;Other (comment) PT Visit Diagnosis: Difficulty in walking, not elsewhere classified (R26.2);History of falling (Z91.81)     Time: 9470-9628 PT Time Calculation (min) (ACUTE ONLY): 28 min  Charges:  $Gait Training: 8-22 mins $Therapeutic Activity: 8-22 mins                     {Payten Beaumier  PTA Acute  Rehabilitation Services Pager      [phone] Office      [phone]

## 2019-10-05 NOTE — Transfer of Care (Signed)
Immediate Anesthesia Transfer of Care Note  Patient: Jessica Cline  Procedure(s) Performed: Procedure(s): ESOPHAGOGASTRODUODENOSCOPY (EGD) WITH PROPOFOL (N/A)  Patient Location: PACU and Endoscopy Unit  Anesthesia Type:General  Level of Consciousness: awake, alert  and oriented  Airway & Oxygen Therapy: Patient Spontanous Breathing and Patient connected to nasal cannula oxygen  Post-op Assessment: Report given to RN and Post -op Vital signs reviewed and stable  Post vital signs: Reviewed and stable  Last Vitals:  Vitals:   10/05/19 0500 10/05/19 1019  BP: 133/62 (!) 142/50  Pulse: 96 90  Resp: 18 19  Temp: 36.8 C 36.7 C  SpO2: 92% 95%    Complications: No apparent anesthesia complications

## 2019-10-05 NOTE — Interval H&P Note (Signed)
History and Physical Interval Note:  10/05/2019 10:26 AM  Jessica Cline  has presented today for surgery, with the diagnosis of cirrhosis.  The various methods of treatment have been discussed with the patient and family. After consideration of risks, benefits and other options for treatment, the patient has consented to  Procedure(s): ESOPHAGOGASTRODUODENOSCOPY (EGD) WITH PROPOFOL (N/A) as a surgical intervention.  The patient's history has been reviewed, patient examined, no change in status, stable for surgery.  I have reviewed the patient's chart and labs.  Questions were answered to the patient's satisfaction.     Stan Head

## 2019-10-06 ENCOUNTER — Other Ambulatory Visit: Payer: Self-pay

## 2019-10-06 DIAGNOSIS — K766 Portal hypertension: Secondary | ICD-10-CM

## 2019-10-06 DIAGNOSIS — K7469 Other cirrhosis of liver: Secondary | ICD-10-CM

## 2019-10-06 LAB — COMPREHENSIVE METABOLIC PANEL
ALT: 27 U/L (ref 0–44)
AST: 46 U/L — ABNORMAL HIGH (ref 15–41)
Albumin: 4.5 g/dL (ref 3.5–5.0)
Alkaline Phosphatase: 76 U/L (ref 38–126)
Anion gap: 13 (ref 5–15)
BUN: 12 mg/dL (ref 6–20)
CO2: 27 mmol/L (ref 22–32)
Calcium: 9.3 mg/dL (ref 8.9–10.3)
Chloride: 104 mmol/L (ref 98–111)
Creatinine, Ser: 0.93 mg/dL (ref 0.44–1.00)
GFR calc Af Amer: >60 mL/min (ref >60)
GFR calc non Af Amer: >60 mL/min (ref >60)
Glucose, Bld: 90 mg/dL (ref 70–99)
Potassium: 3.8 mmol/L (ref 3.5–5.1)
Sodium: 144 mmol/L (ref 135–145)
Total Bilirubin: 8.1 mg/dL — ABNORMAL HIGH (ref 0.3–1.2)
Total Protein: 6.1 g/dL — ABNORMAL LOW (ref 6.5–8.1)

## 2019-10-06 LAB — CBC WITH DIFFERENTIAL/PLATELET
Abs Immature Granulocytes: 0.02 10*3/uL (ref 0.00–0.07)
Basophils Absolute: 0 10*3/uL (ref 0.0–0.1)
Basophils Relative: 0 %
Eosinophils Absolute: 0.1 10*3/uL (ref 0.0–0.5)
Eosinophils Relative: 2 %
HCT: 30.1 % — ABNORMAL LOW (ref 36.0–46.0)
Hemoglobin: 9.9 g/dL — ABNORMAL LOW (ref 12.0–15.0)
Immature Granulocytes: 1 %
Lymphocytes Relative: 20 %
Lymphs Abs: 0.8 10*3/uL (ref 0.7–4.0)
MCH: 33 pg (ref 26.0–34.0)
MCHC: 32.9 g/dL (ref 30.0–36.0)
MCV: 100.3 fL — ABNORMAL HIGH (ref 80.0–100.0)
Monocytes Absolute: 0.5 10*3/uL (ref 0.1–1.0)
Monocytes Relative: 12 %
Neutro Abs: 2.5 10*3/uL (ref 1.7–7.7)
Neutrophils Relative %: 65 %
Platelets: 21 10*3/uL — CL (ref 150–400)
RBC: 3 MIL/uL — ABNORMAL LOW (ref 3.87–5.11)
RDW: 17.1 % — ABNORMAL HIGH (ref 11.5–15.5)
WBC: 3.9 10*3/uL — ABNORMAL LOW (ref 4.0–10.5)
nRBC: 0 % (ref 0.0–0.2)

## 2019-10-06 LAB — MAGNESIUM: Magnesium: 1.5 mg/dL — ABNORMAL LOW (ref 1.7–2.4)

## 2019-10-06 LAB — PHOSPHORUS: Phosphorus: 2.9 mg/dL (ref 2.5–4.6)

## 2019-10-06 MED ORDER — IPRATROPIUM-ALBUTEROL 0.5-2.5 (3) MG/3ML IN SOLN
3.0000 mL | Freq: Four times a day (QID) | RESPIRATORY_TRACT | Status: DC
  Filled 2019-10-06: qty 3

## 2019-10-06 MED ORDER — RIFAXIMIN 550 MG PO TABS: 550.0000 mg | ORAL_TABLET | Freq: Two times a day (BID) | ORAL | 0 refills | Status: DC

## 2019-10-06 MED ORDER — ALBUTEROL SULFATE HFA 108 (90 BASE) MCG/ACT IN AERS: 2.0000 | INHALATION_SPRAY | Freq: Four times a day (QID) | RESPIRATORY_TRACT | 1 refills | Status: AC | PRN

## 2019-10-06 MED ORDER — LACTULOSE 10 GM/15ML PO SOLN: 10.0000 g | Freq: Three times a day (TID) | ORAL | 0 refills | Status: DC

## 2019-10-06 MED ORDER — SPIRONOLACTONE 100 MG PO TABS: 100.0000 mg | ORAL_TABLET | Freq: Two times a day (BID) | ORAL | 0 refills | Status: DC

## 2019-10-06 MED ORDER — FUROSEMIDE 40 MG PO TABS: 40.0000 mg | ORAL_TABLET | Freq: Two times a day (BID) | ORAL | 0 refills | Status: DC

## 2019-10-06 MED ORDER — MAGNESIUM SULFATE 2 GM/50ML IV SOLN
2.0000 g | Freq: Once | INTRAVENOUS | Status: AC
  Administered 2019-10-06: 2 g via INTRAVENOUS
  Filled 2019-10-06: qty 50

## 2019-10-06 NOTE — Discharge Summary (Signed)
Physician Discharge Summary  Jessica Cline WUJ:[id number] DOB: 07-Jul-1965 DOA: 09/27/2019  PCP: Harold Barbanriggers, Laura Clarence Dunsmore, PA-C  Admit date: 09/27/2019 Discharge date: 10/06/2019  Time spent: 40 minutes  Recommendations for Outpatient Follow-up:  1. Follow outpatient CBC/CMP (attention to bilirubin and electrolytes) 2. Continue lasix 40 mg and spironolactone 100 mg - needs labs early next week 3. Continue lactulose - will defer xifaxan at follow up visit outpatient (xifaxan requires prior auth, will defer to gastroenterology) 4. Follow up with heme outpatient for thrombocytopenia 5. She's on celecoxib outpatient -> stopped this at discharge due to severe thrombocytopenia, please follow and discuss other pain modalities  6. Follow diabetes insipidus/DDAVP outpatient - ? Need for endocrine outpatient  Of note, discharge med rec is incorrect - notes spironolactone 100 mg BID and lasix 40 mg BID and xifaxan.  Called pharmacy to correct to 40 mg daily and spironolactone 100 mg daily.  Xifaxan required prior auth, so will defer this to GI outpatient (discussed with GI this AM who noted ok).  I'm unable to change d/c meds as pt has already discharged.  I called to discuss this with patient as well.    Discharge Diagnoses:  Principal Problem:   SBO (small bowel obstruction) (HCC) Active Problems:   Thrombocytopenia (HCC)   Liver cirrhosis secondary to NASH (HCC)   Ascites due to alcoholic cirrhosis (HCC)   Anasarca   Idiopathic esophageal varices without bleeding (HCC)   Portal hypertension (HCC)   Discharge Condition: stable  Diet recommendation: 2 gm sodium  Filed Weights   10/04/19 0858 10/05/19 0643 10/06/19 0500  Weight: 124.5 kg 124 kg 122 kg    History of present illness:  54 year old female with history of liver cirrhosis, thrombocytopenia, essential hypertension came into the hospital with chief complaint of fluid overload as well as abdominal pain, nausea. She is being followed by  gastroenterology as an outpatient is believed that she has liver disease due to combination of EtOH cirrhosis/[place] (tells me she quit alcohol into her mid 30s). Has not had an EGD due to thrombocytopenia. She is also seeing oncology as an outpatient. Imaging on admission concerning for small bowel obstruction/ileusand general surgery was consulted. Her ileus has resolved with paracentesis. GI was consulted due to new onset decompensation of her cirrhosis.  She's now s/p paracentesis with 8.3 L out and diuresis with lasix and spironolactone.  She had EGD with grade 1 and small varices.  She was stable on the day of discharge with plan for outpatient follow up.  See below for additional details  Hospital Course:  Decompensated liver cirrhosis with ascites, splenomegaly, thrombocytopenia new onset of ascites and significant fluid overload Now transitioned to PO lasix/spironolactone - discharge with 40 mg lasix/100 mg spironolactone  GI consulted and following. Recommend lasix 40/spironolactone 100 mg outpatient, continue lactulose - follow outpatient for need for rifaximin I/O, daily weights (negative 8.6 L) S/p paracentesis 7/7 with 8.3 L removed S/p EGD on 7/14 due to high risk with thrombocytopenia -> EGD notable for grade 1 and small varices, otherwise normal -> rec full liquid diet, endoscopy in 1 year Bili up today, GI aware, follow outpatient   Small bowel obstruction /adynamic ileus-this is likely in the setting of significant ascites, improving. General surgery was initially consulted, signed off on 7/8.  Hypokalemia - resolved  Essential hypertension-Norvasc on hold.  Continue lasix/spironolactone.  Morbid obesity, based on BMI greater than 40, she does have Nancey Kreitz degree of fluid overload but clearly has underlying obesity, she  would benefit from weight loss  Wheezing: mild wheezing noted on exam today, refill of albuterol, follow outpatient.  I don't think steroids indicated at  this time.  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  patient was diagnosed Tennelle Taflinger long time ago and has DDAVP at home that she uses as needed? She'll need to follow outpatient with her PCP or endocrinolgoy to follow up DDAVP use.   Procedures: Impression - Grade I and small (< 5 mm) esophageal varices. - The examination was otherwise normal. - No specimens collected. Recommendations - Return patient to hospital ward for ongoing care. - Full liquid diet. - Continue present medications. - Repeat upper endoscopy in 1 year for screening purposes. - Recall for EGD is with Dr. Orvan Falconer primary gastroenterologist  Consultations:  GI  Discharge Exam: Vitals:   10/06/19 0856 10/06/19 0925  BP: 129/64   Pulse: 91   Resp:    Temp:    SpO2:  93%   Eager for d/c home Discussed d/c plan and recommendations  General: No acute distress. Icteric sclera Cardiovascular: Heart sounds show Tanaja Ganger regular rate, and rhythm. Lungs: Clear to auscultation bilaterally  Abdomen: distended, non tender Neurological: Alert and oriented 3. Moves all extremities 4. Cranial nerves II through XII grossly intact. Skin: Warm and dry. No rashes or lesions. Extremities: No clubbing or cyanosis. No edema.  Discharge Instructions   Discharge Instructions    Call MD for:  difficulty breathing, headache or visual disturbances   Complete by: As directed    Call MD for:  extreme fatigue   Complete by: As directed    Call MD for:  hives   Complete by: As directed    Call MD for:  persistant dizziness or light-headedness   Complete by: As directed    Call MD for:  persistant nausea and vomiting   Complete by: As directed    Call MD for:  redness, tenderness, or signs of infection (pain, swelling, redness, odor or green/yellow discharge around incision site)   Complete by: As directed    Call MD for:  severe uncontrolled pain    Complete by: As directed    Call MD for:  temperature >100.4   Complete by: As directed    Diet - low sodium heart healthy   Complete by: As directed    Discharge instructions   Complete by: As directed    You were seen for your cirrhosis and ascites.  You've improved after Theadora Noyes paracentesis and starting diuretics with lasix and spironolactone.  Continue your lasix and spironolactone.  Please follow up labs within 1 week with your PCP or GI.  You've been started on lactulose and rifaximin.  Take the lactulose three times daily, you can titrate this up or down, but you'll need to make sure you're having 2-3 bowel movements Maebell Lyvers day to avoid confusion.   You had an EGD with gastroenterology which showed grade 1 and small varices.  You'll need to follow up with GI for Tyge Somers repeat endoscopy in Tennyson Wacha year.    We stopped your amlodipine because we started lasix and spironolactone.    Please follow up with your PCP regarding your DDAVP (or whoever prescribes this for you, endocrinology or your PCP).  Please follow up with gastroenterology as scheduled.   You had some wheezing today, continue your inhalers.   Please follow up with hematology for your low platelets.   Return for new, recurrent,  or worsening symptoms.  Please ask your PCP to request records from this hospitalization so they know what was done and what the next steps will be.   Face-to-face encounter (required for Medicare/Medicaid patients)   Complete by: As directed    I Lacretia Nicks certify that this patient is under my care and that I, or Gilberte Gorley nurse practitioner or physician's assistant working with me, had Telford Archambeau face-to-face encounter that meets the physician face-to-face encounter requirements with this patient on 10/06/2019. The encounter with the patient was in whole, or in part for the following medical condition(s) which is the primary reason for home health care (List medical condition): cirrhosis   The encounter with the patient was  in whole, or in part, for the following medical condition, which is the primary reason for home health care: cirrhosis   I certify that, based on my findings, the following services are medically necessary home health services:  Nursing Physical therapy     Reason for Medically Necessary Home Health Services: Therapy- Therapeutic Exercises to Increase Strength and Endurance   My clinical findings support the need for the above services: Unable to leave home safely without assistance and/or assistive device   Further, I certify that my clinical findings support that this patient is homebound due to: Unable to leave home safely without assistance   Home Health   Complete by: As directed    To provide the following care/treatments:  PT RN Home Health Aide     Increase activity slowly   Complete by: As directed      Allergies as of 10/06/2019      Reactions   Plasma, Human Swelling      Medication List    STOP taking these medications   amLODipine 10 MG tablet Commonly known as: NORVASC   celecoxib 100 MG capsule Commonly known as: CELEBREX     TAKE these medications   albuterol 108 (90 Base) MCG/ACT inhaler Commonly known as: VENTOLIN HFA Inhale 2 puffs into the lungs every 6 (six) hours as needed for wheezing or shortness of breath. Reported on 08/07/2015   Combivent Respimat 20-100 MCG/ACT Aers respimat Generic drug: Ipratropium-Albuterol Inhale 1 puff into the lungs in the morning, at noon, and at bedtime.   desmopressin 0.01 % Soln Commonly known as: DDAVP Place 1 spray into the nose 2 (two) times daily as needed (to prevent dehydration).   furosemide 40 MG tablet Commonly known as: LASIX Take 1 tablet (40 mg total) by mouth 2 (two) times daily. What changed:   medication strength  how much to take  when to take this   lactulose 10 GM/15ML solution Commonly known as: CHRONULAC Take 15 mLs (10 g total) by mouth 3 (three) times daily. (titrate to 2-3 bowel  movements daily)   rifaximin 550 MG Tabs tablet Commonly known as: XIFAXAN Take 1 tablet (550 mg total) by mouth 2 (two) times daily.   sertraline 100 MG tablet Commonly known as: ZOLOFT Take 200 mg by mouth daily.   spironolactone 100 MG tablet Commonly known as: ALDACTONE Take 1 tablet (100 mg total) by mouth 2 (two) times daily.   traZODone 100 MG tablet Commonly known as: DESYREL Take 200 mg by mouth at bedtime.      Allergies  Allergen Reactions   Plasma, Human Swelling    Follow-up Information    Esterwood, Amy S, PA-C Follow up on 10/31/2019.   Specialty: Gastroenterology Why: at 9 am. Please arrive 10 minutes early Contact  information: [address] [place] [address] [phone]                The results of significant diagnostics from this hospitalization (including imaging, microbiology, ancillary and laboratory) are listed below for reference.    Significant Diagnostic Studies: DG Abd 1 View  Result Date: 09/29/2019 CLINICAL DATA:  Ileus. EXAM: ABDOMEN - 1 VIEW COMPARISON:  CT 09/27/2019. FINDINGS: Mildly prominent air-filled loops of small and large bowel noted. Findings most consistent with adynamic ileus. No free air. Degenerative changes scoliosis thoracolumbar spine. No acute bony abnormality identified. IMPRESSION: Mildly prominent air-filled loops of small large bowel most consistent with adynamic ileus. Continued follow-up exams suggested to exclude bowel obstruction. Reference is made to CT report of 09/27/2019. Electronically Signed   By: Maisie Fus  Register   On: 09/29/2019 05:47   CT Abdomen Pelvis W Contrast  Result Date: 09/27/2019 CLINICAL DATA:  Abdominal distension and low platelets. EXAM: CT ABDOMEN AND PELVIS WITH CONTRAST TECHNIQUE: Multidetector CT imaging of the abdomen and pelvis was performed using the standard protocol following bolus administration of intravenous contrast. CONTRAST:  OMNIPAQUE IOHEXOL 300 MG/ML  SOLN  COMPARISON:  None. FINDINGS: Lower chest: No acute abnormality. Hepatobiliary: The liver is cirrhotic in appearance. Numerous tiny gallstones are seen within the lumen of Natallie Ravenscroft normal appearing gallbladder. Pancreas: Unremarkable. No pancreatic ductal dilatation or surrounding inflammatory changes. Spleen: There is mild to moderate severity splenomegaly. Adrenals/Urinary Tract: Adrenal glands are unremarkable. Kidneys are normal, without renal calculi, focal lesion, or hydronephrosis. Bladder is unremarkable. Stomach/Bowel: Stomach is within normal limits. The appendix is not clearly identified. Miken Stecher dilated loop of mid to distal small bowel is seen within the mid abdomen (maximum small bowel diameter of approximately 4.4 cm). Haralambos Yeatts gradual transition zone is seen along the anterior medial aspect of the mid abdomen. Vascular/Lymphatic: No significant vascular findings are present. No enlarged abdominal or pelvic lymph nodes. Reproductive: Uterus and bilateral adnexa are unremarkable. Other: No abdominal wall hernia or abnormality. Marynell Bies large amount of nonhemorrhagic abdominal and pelvic free fluid is noted (approximately 12.16 Hounsfield units). Musculoskeletal: Moderate to marked severity anasarca is seen throughout the abdominal and pelvic walls. Multiple chronic posterolateral right-sided rib fractures are seen. Degenerative changes seen within the lumbar spine. IMPRESSION: 1. Findings consistent with Robertha Staples proximal small bowel obstruction. 2. Large amount of nonhemorrhagic abdominal and pelvic free fluid. 3. Cirrhosis with splenomegaly. 4. Cholelithiasis. Electronically Signed   By: Aram Candela M.D.   On: 09/27/2019 19:00   US Paracentesis  Result Date: 09/28/2019 INDICATION: Patient with history of cirrhosis, thrombocytopenia, hypertension, small-bowel obstruction, splenomegaly, ascites; request received for diagnostic and therapeutic paracentesis. EXAM: ULTRASOUND GUIDED DIAGNOSTIC AND THERAPEUTIC PARACENTESIS  MEDICATIONS: None COMPLICATIONS: None immediate. PROCEDURE: Informed written consent was obtained from the patient after Ariez Neilan discussion of the risks, benefits and alternatives to treatment. Paiden Cavell timeout was performed prior to the initiation of the procedure. Initial ultrasound scanning demonstrates Kamariya Blevens large amount of ascites within the left lower abdominal quadrant. The left lower abdomen was prepped and draped in the usual sterile fashion. 1% lidocaine was used for local anesthesia. Following this, Smrithi Pigford 19 gauge, 10-cm, Yueh catheter was introduced. An ultrasound image was saved for documentation purposes. The paracentesis was performed. The catheter was removed and Almira Phetteplace dressing was applied. The patient tolerated the procedure well without immediate post procedural complication. Patient received post-procedure intravenous albumin; see nursing notes for details. FINDINGS: Ariyah Sedlack total of approximately 8.3 liters of yellow fluid was removed. Samples  were sent to the laboratory as requested by the clinical team. IMPRESSION: Successful ultrasound-guided diagnostic and therapeutic paracentesis yielding 8.3 liters of peritoneal fluid. Read by: Jeananne Rama, PA-C Electronically Signed   By: Corlis Leak M.D.   On: 09/28/2019 12:40   DG Chest Port 1 View  Result Date: 09/27/2019 CLINICAL DATA:  Shortness of breath EXAM: PORTABLE CHEST 1 VIEW COMPARISON:  None. FINDINGS: Multiple old right-sided rib fractures. No focal opacity or pleural effusion. Normal heart size. No pneumothorax. IMPRESSION: No active disease. Electronically Signed   By: Jasmine Pang M.D.   On: 09/27/2019 17:16   ECHOCARDIOGRAM COMPLETE  Result Date: 10/02/2019    ECHOCARDIOGRAM REPORT   Patient Name:   CARNETTA LOSADA Date of Exam: 10/02/2019 Medical Rec #:  [id number]      Height:       65.0 in Accession #:    [id number]     Weight:       283.8 lb Date of Birth:  1966-01-29      BSA:          2.295 m Patient Age:    53 years       BP:           115/49 mmHg Patient  Gender: F              HR:           96 bpm. Exam Location:  Inpatient Procedure: 2D Echo Indications:    CHF-Acute Diastolic I50.31  History:        Patient has no prior history of Echocardiogram examinations.                 Risk Factors:Hypertension.  Sonographer:    Thurman Coyer RDCS (AE) Referring Phys: [id number] Daylene Katayama [facility] IMPRESSIONS  1. Left ventricular ejection fraction, by estimation, is 60 to 65%. The left ventricle has normal function. The left ventricle has no regional wall motion abnormalities. Left ventricular diastolic parameters were normal.  2. Right ventricular systolic function is normal. The right ventricular size is normal. Tricuspid regurgitation signal is inadequate for assessing PA pressure.  3. The mitral valve is grossly normal. No evidence of mitral valve regurgitation. No evidence of mitral stenosis.  4. The aortic valve is tricuspid. Aortic valve regurgitation is not visualized. No aortic stenosis is present. Conclusion(s)/Recommendation(s): Normal LV function. Normal diastolic function. Ascites present. FINDINGS  Left Ventricle: Left ventricular ejection fraction, by estimation, is 60 to 65%. The left ventricle has normal function. The left ventricle has no regional wall motion abnormalities. The left ventricular internal cavity size was normal in size. There is  no left ventricular hypertrophy. Left ventricular diastolic parameters were normal. Normal left ventricular filling pressure. Right Ventricle: The right ventricular size is normal. No increase in right ventricular wall thickness. Right ventricular systolic function is normal. Tricuspid regurgitation signal is inadequate for assessing PA pressure. Left Atrium: Left atrial size was normal in size. Right Atrium: Right atrial size was normal in size. Pericardium: Trivial pericardial effusion is present. Mitral Valve: The mitral valve is grossly normal. No evidence of mitral valve regurgitation. No evidence of mitral valve  stenosis. Tricuspid Valve: The tricuspid valve is grossly normal. Tricuspid valve regurgitation is trivial. No evidence of tricuspid stenosis. Aortic Valve: The aortic valve is tricuspid. Aortic valve regurgitation is not visualized. No aortic stenosis is present. Pulmonic Valve: The pulmonic valve was grossly normal. Pulmonic valve regurgitation is not visualized. No evidence of pulmonic stenosis. Aorta:  The aortic root and ascending aorta are structurally normal, with no evidence of dilitation. Venous: The inferior vena cava was not well visualized. IAS/Shunts: The atrial septum is grossly normal. Additional Comments: Moderate ascites is present.  LEFT VENTRICLE PLAX 2D LVIDd:         4.50 cm  Diastology LVIDs:         2.80 cm  LV e' lateral:   13.50 cm/s LV PW:         1.00 cm  LV E/e' lateral: 7.8 LV IVS:        0.80 cm  LV e' medial:    9.68 cm/s LVOT diam:     2.00 cm  LV E/e' medial:  10.8 LV SV:         76 LV SV Index:   33 LVOT Area:     3.14 cm  RIGHT VENTRICLE RV S prime:     15.10 cm/s TAPSE (M-mode): 2.2 cm LEFT ATRIUM             Index       RIGHT ATRIUM           Index LA diam:        4.20 cm 1.83 cm/m  RA Area:     15.60 cm LA Vol (A2C):   56.4 ml 24.57 ml/m RA Volume:   35.80 ml  15.60 ml/m LA Vol (A4C):   43.4 ml 18.91 ml/m LA Biplane Vol: 49.5 ml 21.57 ml/m  AORTIC VALVE LVOT Vmax:   135.00 cm/s LVOT Vmean:  88.400 cm/s LVOT VTI:    0.242 m  AORTA Ao Root diam: 3.00 cm Ao Asc diam:  2.80 cm MITRAL VALVE MV Area (PHT): 3.93 cm     SHUNTS MV Decel Time: 193 msec     Systemic VTI:  0.24 m MV E velocity: 105.00 cm/s  Systemic Diam: 2.00 cm MV Day Greb velocity: 87.80 cm/s MV E/Aman Batley ratio:  1.20 Lennie Odor MD Electronically signed by Lennie Odor MD Signature Date/Time: 10/02/2019/2:12:14 PM    Final    US LIVER DOPPLER  Result Date: 10/01/2019 CLINICAL DATA:  Cirrhosis EXAM: DUPLEX ULTRASOUND OF LIVER TECHNIQUE: Color and duplex Doppler ultrasound was performed to evaluate the hepatic in-flow and  out-flow vessels. Technologist describes technically difficult study secondary to body habitus and bowel gas. COMPARISON:  CT 09/27/2019 FINDINGS: Liver: Coarse heterogenous echotexture.  Nodular capsular contour. No focal lesion, mass or intrahepatic biliary ductal dilatation. Main Portal Vein size: 1.4 cm Portal Vein Velocities (all hepatopetal): Main Prox:  12 cm/sec Main Mid: 12 cm/sec Main Dist:  17 cm/sec Right: 13 cm/sec Left: 18 cm/sec Hepatic Vein Velocities (all hepatofugal): Right:  27 cm/sec Middle:  24 cm/sec Left:  29 cm/sec IVC: Present and patent with normal respiratory phasicity. 58 cm/sec. Hepatic Artery Velocity: Not visualized Splenic Vein Velocity:  16 cm/sec Spleen: 14.1 cm x 7.2 cm x 16.7 cm with Kabrina Christiano total volume of 889 cm^3 (411 cm^3 is upper limit normal) Portal Vein Occlusion/Thrombus: None identified, limited evaluation of left portal venous system. Splenic Vein Occlusion/Thrombus: No Ascites: Moderate perihepatic Varices: None identified Common bile duct is 2.9 mm thickness, unremarkable The gallbladder is incompletely distended, measured 1 measured up to 2.6 mm in thickness. 5 mm calculus. No pericholecystic fluid. No Murphy sign described by technologist. IMPRESSION: 1. No vascular abnormality on hepatic Doppler evaluation. 2. Hepatic morphologic changes consistent with cirrhosis, with stigmata of portal venous hypertension. 3. Cholelithiasis without evidence of acute cholecystitis or biliary  obstruction. Electronically Signed   By: Corlis Leak M.D.   On: 10/01/2019 12:20    Microbiology: Recent Results (from the past 240 hour(s))  SARS Coronavirus 2 by RT PCR (hospital order, performed in [facility] hospital lab) Nasopharyngeal Nasopharyngeal Swab     Status: None   Collection Time: 09/27/19  8:30 PM   Specimen: Nasopharyngeal Swab  Result Value Ref Range Status   SARS Coronavirus 2 NEGATIVE NEGATIVE Final    Comment: (NOTE) SARS-CoV-2 target nucleic acids are NOT  DETECTED.  The SARS-CoV-2 RNA is generally detectable in upper and lower respiratory specimens during the acute phase of infection. The lowest concentration of SARS-CoV-2 viral copies this assay can detect is 250 copies / mL. Jontavia Leatherbury negative result does not preclude SARS-CoV-2 infection and should not be used as the sole basis for treatment or other patient management decisions.  Relena Ivancic negative result may occur with improper specimen collection / handling, submission of specimen other than nasopharyngeal swab, presence of viral mutation(s) within the areas targeted by this assay, and inadequate number of viral copies (<250 copies / mL). Naquisha Whitehair negative result must be combined with clinical observations, patient history, and epidemiological information.  Fact Sheet for Patients:   BoilerBrush.com.cy  Fact Sheet for Healthcare Providers: https://pope.com/  This test is not yet approved or  cleared by the [place] FDA and has been authorized for detection and/or diagnosis of SARS-CoV-2 by FDA under an Emergency Use Authorization (EUA).  This EUA will remain in effect (meaning this test can be used) for the duration of the COVID-19 declaration under Section 564(b)(1) of the Act, 21 U.S.C. section 360bbb-3(b)(1), unless the authorization is terminated or revoked sooner.  Performed at [facility], [address] [place], [place] [address]   Body fluid culture     Status: None   Collection Time: 09/28/19 10:55 AM   Specimen: PATH Cytology Peritoneal fluid  Result Value Ref Range Status   Specimen Description   Final    CYTO PERI Performed at [facility], [address] [place], [place] [address]    Special Requests   Final    NONE Performed at [facility], [address] [place], [place] [address]    Gram Stain   Final    RARE WBC PRESENT, PREDOMINANTLY MONONUCLEAR NO ORGANISMS  SEEN    Culture   Final    NO GROWTH Performed at [facility], [address], [place], [place] [address]    Report Status 10/01/2019 FINAL  Final     Labs: Basic Metabolic Panel: Recent Labs  Lab 09/30/19 0436 10/01/19 0517 10/02/19 0516 10/03/19 0522 10/04/19 0536 10/05/19 0543 10/06/19 0455  NA 133*   < > 139 143 144 143 144  K 3.7   < > 3.4* 4.1 3.6 4.1 3.8  CL 100   < > 100 105 103 105 104  CO2 24   < > GLUCOSE 103*   < > 94 87 99 100* 90  BUN 27*   < > CREATININE 0.91   < > 0.98 0.91 0.95 0.92 0.93  CALCIUM 8.3*   < > 8.9 9.0 9.3 9.5 9.3  MG 1.8  --   --  1.7  --   --  1.5*  PHOS 3.1  --   --  2.7  --   --  2.9   < > = values  in this interval not displayed.   Liver Function Tests: Recent Labs  Lab 09/30/19 0436 10/03/19 0522 10/05/19 0543 10/06/19 0455  AST 60* 64*  --  46*  ALT 32 26  --  27  ALKPHOS 122 82  --  76  BILITOT 3.8* 5.4*  --  8.1*  PROT 5.3* 5.8*  --  6.1*  ALBUMIN 2.7* 4.0 4.7 4.5   No results for input(s): LIPASE, AMYLASE in the last 168 hours. No results for input(s): AMMONIA in the last 168 hours. CBC: Recent Labs  Lab 10/02/19 0516 10/03/19 0522 10/04/19 0536 10/05/19 0543 10/06/19 0455  WBC 3.4* 3.9* 3.7* 4.6 3.9*  NEUTROABS  --   --   --   --  2.5  HGB 10.6* 10.7* 10.6* 10.3* 9.9*  HCT 32.2* 32.0* 32.5* 31.8* 30.1*  MCV 94.4 96.7 96.7 96.1 100.3*  PLT 23* 27* 23* 21* 21*   Cardiac Enzymes: No results for input(s): CKTOTAL, CKMB, CKMBINDEX, TROPONINI in the last 168 hours. BNP: BNP (last 3 results) No results for input(s): BNP in the last 8760 hours.  ProBNP (last 3 results) No results for input(s): PROBNP in the last 8760 hours.  CBG: No results for input(s): GLUCAP in the last 168 hours.     Signed:  Lacretia Nicks MD.  [facility] 10/06/2019, 5:46 PM

## 2019-10-06 NOTE — Progress Notes (Signed)
      Gastroenterology Progress Note  CC:  Cirrhosis  Subjective:  Feels good.  Wants to go home.  Is having about 3 BMs per day with current lactulose dose.    Objective:  Vital signs in last 24 hours: Temp:  [98 F (36.7 C)-98.2 F (36.8 C)] 98.2 F (36.8 C) (07/15 0503) Pulse Rate:  [89-97] 91 (07/15 0856) Resp:  [16-20] 18 (07/15 0503) BP: (123-143)/(38-69) 129/64 (07/15 0856) SpO2:  [90 %-100 %] 93 % (07/15 0925) Weight:  [657 kg] 122 kg (07/15 0500) Last BM Date: 10/04/19 General:  Alert, Well-developed, in NAD Heart:  Regular rate and rhythm; no murmurs Pulm:  Wheezing noted from afar and auscultation revealed wheezing and coarse breath sounds, ? Upper airway sounds. Abdomen:  Soft, distended.  BS present.  Non-tender. Extremities:  Trace edema in B/L LEs Neurologic:  Alert and oriented x 4;  grossly normal neurologically. Psych:  Alert and cooperative. Normal mood and affect.  Intake/Output from previous day: 07/14 0701 - 07/15 0700 In: 1580 [P.O.:1120; I.V.:400; IV Piggyback:60] Out: 6100 [Urine:6100]  Lab Results: Recent Labs    10/04/19 0536 10/05/19 0543 10/06/19 0455  WBC 3.7* 4.6 3.9*  HGB 10.6* 10.3* 9.9*  HCT 32.5* 31.8* 30.1*  PLT 23* 21* 21*   BMET Recent Labs    10/04/19 0536 10/05/19 0543 10/06/19 0455  NA 144 143 144  K 3.6 4.1 3.8  CL 103 105 104  CO2 27 26 27   GLUCOSE 99 100* 90  BUN 12 12 12   CREATININE 0.95 0.92 0.93  CALCIUM 9.3 9.5 9.3   LFT Recent Labs    10/06/19 0455  PROT 6.1*  ALBUMIN 4.5  AST 46*  ALT 27  ALKPHOS 76  BILITOT 8.1*   Assessment / Plan: # Decompensated cirrhosis( ? Etoh +/- fatty liver) newly diagnosed. (MELD 17).  --Platelets in 20s. Severe thrombocytopenia / splenomegaly. Followed by Hematology in [place]. --Ascites: 8.3 liter LVP on 7/7, no SBP, negative cytology. Getting IV albumin / lasix 40BID/ aldactone 100 BID. K+ normal, renal function normal.  Would only send on lasix 40  mg daily and aldactone 100 mg daily for now.  Will have her come for repeat CMP early next week. --Encephalopathy: ammonia 68 on admission. No evidence for HE on exam.  ContinueLactulose TID. Probably won't be able to  get Xifaxan as outpatient.  Will need to titrate Lactulose downward if too many BMs ( goal is 2-3 BMs a day).  Having 3 BMs a day currently so leave lactulose at current dose for now.  Can try to get Xifaxan at her follow-up outpatient visit if needed. --HCC screening: AFP 5, no mention of liver lesion on contrasted CT scan. --Varices screening: EGD on 7/14 with small esophageal varices. --Hospital follow up with 9/7, PA on 10/31/19 at 9am.  #Anemia. Hgstable.No signs of active GI bleeding.  # Generalized ileus, resolved clinically. General Surgery evaluated, no surgical intervention.No nausea/vomiting. --Will advance diet to 2 gram sodium diet.  # Asymptomatic cholelithiasis    LOS: 8 days   Jessica Cline. Jessica Cline  10/06/2019, 9:38 AM

## 2019-10-06 NOTE — TOC Transition Note (Signed)
Transition of Care [facility]) - CM/SW Discharge Note   Patient Details  Name: Jessica Cline MRN: [id number] Date of Birth: July 08, 1965  Transition of Care [facility]) CM/SW Contact:  Darleene Cleaver, LCSW Phone Number: 10/06/2019, 1:17 PM   Clinical Narrative:     Patient will be going home with home health through [facility].  CSW signing off please reconsult with any other social work needs, home health agency has been notified of planned discharge.    Final next level of care: Home w Home Health Services Barriers to Discharge: Barriers Resolved   Patient Goals and CMS Choice Patient states their goals for this hospitalization and ongoing recovery are:: To return back home with home health services. CMS Medicare.gov Compare Post Acute Care list provided to:: Patient Choice offered to / list presented to : Patient  Discharge Placement                       Discharge Plan and Services In-house Referral: Clinical Social Work                        HH Arranged: PT, RN, Nurse's Aide HH Agency: [facility] Date [facility] Agency Contacted: 10/06/19 Time HH Agency Contacted: [phone] Representative spoke with at [facility] Agency: Morrie Sheldon  Social Determinants of Health (SDOH) Interventions     Readmission Risk Interventions Readmission Risk Prevention Plan 09/28/2019  Medication Screening Complete  Transportation Screening Complete  Some recent data might be hidden

## 2019-10-06 NOTE — Care Management Important Message (Signed)
Important Message  Patient Details IM Letter presented to the Patient Name: Jessica Cline MRN: [id number] Date of Birth: 1966/02/18   Medicare Important Message Given:  Yes     Caren Macadam 10/06/2019, 12:51 PM

## 2019-10-06 NOTE — Discharge Instructions (Signed)
Low Sodium Nutrition Therapy  Eating less sodium can help you if you have high blood pressure, heart failure, or kidney or liver disease.   Your body needs a little sodium, but too much sodium can cause your body to hold onto extra water. This extra water will raise your blood pressure and can cause damage to your heart, kidneys, or liver as they are forced to work harder.   Sometimes you can see how the extra fluid affects you because your hands, legs, or belly swell. You may also hold water around your heart and lungs, which makes it hard to breathe.   Even if you take medication for blood pressure or a water pill (diuretic) to remove fluid, it is still important to have less salt in your diet.   Check with your primary care provider before drinking alcohol since it may affect the amount of fluid in your body and how your heart, kidneys, or liver work.  Sodium in Food A low-sodium meal plan limits the sodium that you get from food and beverages to 1,500-2,000 milligrams (mg) per day. Salt is the main source of sodium. Read the nutrition label on the package to find out how much sodium is in one serving of a food.  . Select foods with 140 milligrams (mg) of sodium or less per serving.  . You may be able to eat one or two servings of foods with a little more than 140 milligrams (mg) of sodium if you are closely watching how much sodium you eat in a day.  . Check the serving size on the label. The amount of sodium listed on the label shows the amount in one serving of the food. So, if you eat more than one serving, you will get more sodium than the amount listed.  Tips Cutting Back on Sodium . Eat more fresh foods.  . Fresh fruits and vegetables are low in sodium, as well as frozen vegetables and fruits that have no added juices or sauces.  . Fresh meats are lower in sodium than processed meats, such as bacon, sausage, and hotdogs.  . Not all processed foods are unhealthy, but some processed  foods may have too much sodium.  . Eat less salt at the table and when cooking. One of the ingredients in salt is sodium.  . One teaspoon of table salt has 2,300 milligrams of sodium.  . Leave the salt out of recipes for pasta, casseroles, and soups. . Be a smart shopper.  . Food packages that say "Salt-free", sodium-free", "very low sodium," and "low sodium" have less than 140 milligrams of sodium per serving.  . Beware of products identified as "Unsalted," "No Salt Added," "Reduced Sodium," or "Lower Sodium." These items may still be high in sodium. You should always check the nutrition label. . Add flavors to your food without adding sodium.  . Try lemon juice, lime juice, or vinegar.  . Dry or fresh herbs add flavor.  Arnoldo Morale a sodium-free seasoning blend or make your own at home. . You can purchase salt-free or sodium-free condiments like barbeque sauce in stores and online. Ask your registered dietitian nutritionist for recommendations and where to find them.  .  Eating in Restaurants . Choose foods carefully when you eat outside your home. Restaurant foods can be very high in sodium. Many restaurants provide nutrition facts on their menus or their websites. If you cannot find that information, ask your server. Let your server know that you want your  food to be cooked without salt and that you would like your salad dressing and sauces to be served on the side.  .   . Foods Recommended . Food Group . Foods Recommended  . Grains . Bread, bagels, rolls without salted tops Homemade bread made with reduced-sodium baking powder Cold cereals, especially shredded wheat and puffed rice Oats, grits, or cream of wheat Pastas, quinoa, and rice Popcorn, pretzels or crackers without salt Corn tortillas  . Protein Foods . Fresh meats and fish; [place] bacon (check the nutrition labels - make sure they are not packaged in a sodium solution) Canned or packed tuna (no more than 4 ounces at 1  serving) Beans and peas Soybeans) and tofu Eggs Nuts or nut butters without salt  . Dairy . Milk or milk powder Plant milks, such as rice and soy Yogurt, including Greek yogurt Small amounts of natural cheese (blocks of cheese) or reduced-sodium cheese can be used in moderation. (Swiss, ricotta, and fresh mozzarella cheese are lower in sodium than the others) Cream Cheese Low sodium cottage cheese  . Vegetables . Fresh and frozen vegetables without added sauces or salt Homemade soups (without salt) Low-sodium, salt-free or sodium-free canned vegetables and soups  . Fruit . Fresh and canned fruits Dried fruits, such as raisins, cranberries, and prunes  . Oils . Tub or liquid margarine, regular or without salt [place], corn, peanut, olive, safflower, or sunflower oils  . Condiments . Fresh or dried herbs such as basil, bay leaf, dill, mustard (dry), nutmeg, paprika, parsley, rosemary, sage, or thyme.  Low sodium ketchup Vinegar  Lemon or lime juice Pepper, red pepper flakes, and cayenne. Hot sauce contains sodium, but if you use just a drop or two, it will not add up to much.  Salt-free or sodium-free seasoning mixes and marinades Simple salad dressings: vinegar and oil  .  Marland Kitchen Foods Not Recommended . Food Group . Foods Not Recommended  . Grains . Breads or crackers topped with salt Cereals (hot/cold) with more than 300 mg sodium per serving Biscuits, cornbread, and other "quick" breads prepared with baking soda Pre-packaged bread crumbs Seasoned and packaged rice and pasta mixes Self-rising flours  . Protein Foods . Cured meats: Bacon, ham, sausage, pepperoni and hot dogs Canned meats (chili, vienna sausage, or sardines) Smoked fish and meats Frozen meals that have more than 600 mg of sodium per serving Egg substitute (with added sodium)  . Dairy . Buttermilk Processed cheese spreads Cottage cheese (1 cup may have over 500 mg of sodium; look for low-sodium.) American or feta  cheese Shredded Cheese has more sodium than blocks of cheese String cheese  . Vegetables . Canned vegetables (unless they are salt-free, sodium-free or low sodium) Frozen vegetables with seasoning and sauces Sauerkraut and pickled vegetables Canned or dried soups (unless they are salt-free, sodium-free, or low sodium) [place] fries and onion rings  . Fruit . Dried fruits preserved with additives that have sodium  . Oils . Salted butter or margarine, all types of olives  . Condiments . Salt, sea salt, kosher salt, onion salt, and garlic salt Seasoning mixes with salt Bouillon cubes Ketchup Barbeque sauce and Worcestershire sauce unless low sodium Soy sauce Salsa, pickles, olives, relish Salad dressings: ranch, blue cheese, [place], and [place].  .  . Low Sodium Sample 1-Day Menu  . Breakfast . 1 cup cooked oatmeal  . 1 slice whole wheat bread toast  . 1 tablespoon peanut butter without salt  . 1 banana  .  1 cup 1% milk  . Lunch . Tacos made with: 2 corn tortillas  .  cup black beans, low sodium  .  cup roasted or grilled chicken (without skin)  .  avocado  . Squeeze of lime juice  . 1 cup salad greens  . 1 tablespoon low-sodium salad dressing  .  cup strawberries  . 1 orange  . Afternoon Snack . 1/3 cup grapes  . 6 ounces yogurt  . Evening Meal . 3 ounces herb-baked fish  . 1 baked potato  . 2 teaspoons olive oil  .  cup cooked carrots  . 2 thick slices tomatoes on:  . 2 lettuce leaves  . 1 teaspoon olive oil  . 1 teaspoon balsamic vinegar  . 1 cup 1% milk  . Evening Snack . 1 apple  .  cup almonds without salt  .  Marland Kitchen Low-Sodium Vegetarian (Lacto-Ovo) Sample 1-Day Menu  . Breakfast . 1 cup cooked oatmeal  . 1 slice whole wheat toast  . 1 tablespoon peanut butter without salt  . 1 banana  . 1 cup 1% milk  . Lunch . Tacos made with: 2 corn tortillas  .  cup black beans, low sodium  .  cup roasted or grilled chicken (without skin)  .  avocado  . Squeeze of  lime juice  . 1 cup salad greens  . 1 tablespoon low-sodium salad dressing  .  cup strawberries  . 1 orange  . Evening Meal . Stir fry made with:  cup tofu  . 1 cup brown rice  .  cup broccoli  .  cup green beans  .  cup peppers  .  tablespoon peanut oil  . 1 orange  . 1 cup 1% milk  . Evening Snack . 4 strips celery  . 2 tablespoons hummus  . 1 hard-boiled egg  .  Marland Kitchen Low-Sodium Vegan Sample 1-Day Menu  . Breakfast . 1 cup cooked oatmeal  . 1 tablespoon peanut butter without salt  . 1 cup blueberries  . 1 cup soymilk fortified with calcium, vitamin B12, and vitamin D  . Lunch . 1 small whole wheat pita  .  cup cooked lentils  . 2 tablespoons hummus  . 4 carrot sticks  . 1 medium apple  . 1 cup soymilk fortified with calcium, vitamin B12, and vitamin D  . Evening Meal . Stir fry made with:  cup tofu  . 1 cup brown rice  .  cup broccoli  .  cup green beans  .  cup peppers  .  tablespoon peanut oil  . 1 cup cantaloupe  . Evening Snack . 1 cup soy yogurt  .  cup mixed nuts  . Copyright 2020  Academy of Nutrition and Dietetics. All rights reserved .  Marland Kitchen Sodium Free Flavoring Tips .  Marland Kitchen When cooking, the following items may be used for flavoring instead of salt or seasonings that contain sodium. . Remember: A little bit of spice goes a long way! Be careful not to overseason. Marland Kitchen Spice Blend Recipe (makes about ? cup) . 5 teaspoons onion powder  . 2 teaspoons garlic powder  . 2 teaspoons paprika  . 2 teaspoon dry mustard  . 1 teaspoon crushed thyme leaves  .  teaspoon white pepper  .  teaspoon celery seed Food Item Flavorings  Beef Basil, bay leaf, caraway, curry, dill, dry mustard, garlic, grape jelly, green pepper, mace, marjoram, mushrooms (fresh), nutmeg, onion or onion powder, parsley, pepper,  rosemary, sage  Chicken Basil, cloves, cranberries, mace, mushrooms (fresh), nutmeg, oregano, paprika, parsley, pineapple, saffron, sage, savory, tarragon,  thyme, tomato, turmeric  Egg Chervil, curry, dill, dry mustard, garlic or garlic powder, green pepper, jelly, mushrooms (fresh), nutmeg, onion powder, paprika, parsley, rosemary, tarragon, tomato  Fish Basil, bay leaf, chervil, curry, dill, dry mustard, green pepper, lemon juice, marjoram, mushrooms (fresh), paprika, pepper, tarragon, tomato, turmeric  Lamb Cloves, curry, dill, garlic or garlic powder, mace, mint, mint jelly, onion, oregano, parsley, pineapple, rosemary, tarragon, thyme  Pork Applesauce, basil, caraway, chives, cloves, garlic or garlic powder, onion or onion powder, rosemary, thyme  Veal Apricots, basil, bay leaf, currant jelly, curry, ginger, marjoram, mushrooms (fresh), oregano, paprika  Vegetables Basil, dill, garlic or garlic powder, ginger, lemon juice, mace, marjoram, nutmeg, onion or onion powder, tarragon, tomato, sugar or sugar substitute, salt-free salad dressing, vinegar  Desserts Allspice, anise, cinnamon, cloves, ginger, mace, nutmeg, vanilla extract, other extracts   Copyright 2020  Academy of Nutrition and Dietetics. All rights reserved  Fluid Restricted Nutrition Therapy  You have been prescribed this diet because your condition affects how much fluid you can eat or drink. If your heart, liver, or kidneys aren't working properly, you may not be able to effectively eliminate fluids from the body and this may cause swelling (edema) in the legs, arms, and/or stomach. Drink no more than _________ liters or ________ ounces or ________cups of fluid per day.  . You don't need to stop eating or drinking the same fluids you normally would, but you may need to eat or drink less than usual.  . Your registered dietitian nutritionist will help you determine the correct amount of fluid to consume during the day Breakfast Include fluids taken with medications  Lunch Include fluids taken with medications  Dinner Include fluids taken with medications  Bedtime Snack Include fluids  taken with medications     Tips What Are Fluids?  A fluid is anything that is liquid or anything that would melt if left at room temperature. You will need to count these foods and liquids--including any liquid used to take medication--as part of your daily fluid intake. Some examples are: . Alcohol (drink only with your doctor's permission)  . Coffee, tea, and other hot beverages  . Gelatin (Jell-O)  . Gravy  . Ice cream, sherbet, sorbet  . Ice cubes, ice chips  . Milk, liquid creamer  . Nutritional supplements  . Popsicles  . Vegetable and fruit juices; fluid in canned fruit  . Watermelon  . Yogurt  . Soft drinks, lemonade, limeade  . Soups  . Syrup How Do I Measure My Fluid Intake? Marland Kitchen. Record your fluid intake daily.  . Tip: Every day, each time you eat or drink fluids, pour water in the same amount into an empty container that can hold the same amount of fluids you are allowed daily. This may help you keep track of how much fluid you are taking in throughout the day.  . To accurately keep track of how much liquid you take in, measure the size of the cups, glasses, and bowls you use. If you eat soup, measure how much of it is liquid and how much is solid (such as noodles, vegetables, meat). Conversions for Measuring Fluid Intake  Milliliters (mL) Liters (L) Ounces (oz) Cups (c)  1000 1 32 4  1200 1.2 40 5  1500 1.5 50 6 1/4  1800 1.8 60 7 1/2  2000 2 67 8 1/3  Tips to Reduce Your Thirst . Chew gum or suck on hard candy.  . Rinse or gargle with mouthwash. Do not swallow.  . Ice chips or popsicles my help quench thirst, but this too needs to be calculated into the total restriction. Melt ice chips or cubes first to figure out how much fluid they produce (for example, experiment with melting  cup ice chips or 2 ice cubes).  . Add a lemon wedge to your water.  . Limit how much salt you take in. A high salt intake might make you thirstier.  . Don't eat or drink all your allowed  liquids at once. Space your liquids out through the day.  . Use small glasses and cups and sip slowly. If allowed, take your medications with fluids you eat or drink during a meal.   Fluid-Restricted Nutrition Therapy Sample 1-Day Menu  Breakfast 1 slice wheat toast  1 tablespoon peanut butter  1/2 cup yogurt (120 milliliters)  1/2 cup blueberries  1 cup milk (240 milliliters)   Lunch 3 ounces sliced [place]  2 slices whole wheat bread  1/2 cup lettuce for sandwich  2 slices tomato for sandwich  1 ounce reduced-fat, reduced-sodium cheese  1/2 cup fresh carrot sticks  1 banana  1 cup unsweetened tea (240 milliliters)   Evening Meal 8 ounces soup (240 milliliters)  3 ounces salmon  1/2 cup quinoa  1 cup green beans  1 cup mixed greens salad  1 tablespoon olive oil  1 cup coffee (240 milliliters)  Evening Snack 1/2 cup sliced peaches  1/2 cup frozen yogurt (120 milliliters)  1 cup water (240 milliliters)  Copyright 2020  Academy of Nutrition and Dietetics. All rights reserved  Reducing Sodium in diet  At the store/while shopping for food:  Choose packaged and prepared foods carefully. Compare labels and choose the product with the lowest amount of sodium (per serving) you can find in your store. You might be surprised that different brands of the same food can have different sodium levels.  Pick fresh and frozen poultry that hasn't been injected with a sodium solution. Check the fine print on the packaging for terms like "broth," "saline" or "sodium solution." Sodium levels in unseasoned fresh meats are around 100 milligrams (mg) or less per 4-ounce serving.  Select condiments with care. For example, soy sauce, bottled salad dressings, dips, ketchup, jarred salsas, capers, mustard, pickles, olives and relish can be sky-high in sodium. Look for a reduced- or lower-sodium version.  Opt for canned vegetables labeled "no salt added" and frozen vegetables without salty sauces. When  they're added to a casserole, soup or other mixed dish, there are so many other ingredients involved that you won't miss the salt.  When preparing food:  Use onions, garlic, herbs, spices, citrus juices and vinegars in place of some or all of the salt to add flavor. Our recipes and tips can help!  Drain and rinse canned beans (like chickpeas, kidney beans, etc.) and vegetables. You'll cut the sodium by up to 40 percent.  Combine lower-sodium versions of food with regular versions. If you don't like the taste of lower-sodium foods right now, try combining them in equal parts with a regular version of the same food. You'll get less salt and probably won't notice much difference in taste. This works especially well for broths, soups and tomato-based pasta sauces.  Cook pasta, rice and hot cereal without salt. You're likely going to add other flavorful ingredients, so you won't miss  the salt.  Cook by grilling, braising, roasting, searing and sauting to bring out natural flavors. This will reduce the need to add salt.  Incorporate foods with potassium like sweet potatoes, potatoes, greens, tomatoes and lower-sodium tomato sauce, white beans, kidney beans, nonfat yogurt, oranges, bananas and cantaloupe. Potassium helps counter the effects of sodium and may help lower your blood pressure.  At restaurants:  Tell them how you like it. Ask for your dish to be made without extra salt.  Taste your food before adding salt. If you think it needs a boost of flavor, add freshly ground black pepper or a squeeze of fresh lemon or lime and test it again before adding salt. Lemon and pepper are especially good on fish, chicken and vegetables.  Watch out for these food words: pickled, brined, barbecued, cured, smoked, broth, au jus, soy sauce, miso or teriyaki sauce. These tend to be high in sodium. Foods that are steamed, baked, grilled, poached or roasted may have less sodium.  Control portion sizes. When you  cut calories, you usually cut the sodium too. Ask if smaller portions are available, share the meal with a friend or ask for a to-go box when you order and place half the meal in the box to eat later.  From the American Heart Association   PLEASE COME TO THE [facility] AT [address] TO HAVE REPEAT LABS DRAWN ON 7/19 OR 7/20.  GO TO THE BASEMENT LEVEL BETWEEN 8 AM AND 5 PM.  NO APPT NEEDED.

## 2019-10-07 ENCOUNTER — Encounter (HOSPITAL_COMMUNITY): Payer: Self-pay | Admitting: Internal Medicine

## 2019-10-11 ENCOUNTER — Inpatient Hospital Stay: Payer: Medicare Other | Attending: Oncology

## 2019-10-11 ENCOUNTER — Other Ambulatory Visit: Payer: Self-pay

## 2019-10-11 DIAGNOSIS — D696 Thrombocytopenia, unspecified: Secondary | ICD-10-CM | POA: Diagnosis not present

## 2019-10-11 LAB — CBC WITH DIFFERENTIAL/PLATELET
Abs Immature Granulocytes: 0.01 10*3/uL (ref 0.00–0.07)
Basophils Absolute: 0 10*3/uL (ref 0.0–0.1)
Basophils Relative: 1 %
Eosinophils Absolute: 0.1 10*3/uL (ref 0.0–0.5)
Eosinophils Relative: 2 %
HCT: 35.5 % — ABNORMAL LOW (ref 36.0–46.0)
Hemoglobin: 11.8 g/dL — ABNORMAL LOW (ref 12.0–15.0)
Immature Granulocytes: 0 %
Lymphocytes Relative: 17 %
Lymphs Abs: 0.8 10*3/uL (ref 0.7–4.0)
MCH: 31.3 pg (ref 26.0–34.0)
MCHC: 33.2 g/dL (ref 30.0–36.0)
MCV: 94.2 fL (ref 80.0–100.0)
Monocytes Absolute: 0.5 10*3/uL (ref 0.1–1.0)
Monocytes Relative: 10 %
Neutro Abs: 3.3 10*3/uL (ref 1.7–7.7)
Neutrophils Relative %: 70 %
Platelets: 26 10*3/uL — CL (ref 150–400)
RBC: 3.77 MIL/uL — ABNORMAL LOW (ref 3.87–5.11)
RDW: 16.8 % — ABNORMAL HIGH (ref 11.5–15.5)
WBC: 4.8 10*3/uL (ref 4.0–10.5)
nRBC: 0 % (ref 0.0–0.2)

## 2019-10-31 ENCOUNTER — Encounter: Payer: Self-pay | Admitting: Physician Assistant

## 2019-10-31 ENCOUNTER — Other Ambulatory Visit (INDEPENDENT_AMBULATORY_CARE_PROVIDER_SITE_OTHER): Payer: Medicare Other

## 2019-10-31 ENCOUNTER — Ambulatory Visit (INDEPENDENT_AMBULATORY_CARE_PROVIDER_SITE_OTHER): Payer: Medicare Other | Admitting: Physician Assistant

## 2019-10-31 VITALS — BP 120/60 | HR 78 | Ht 65.0 in | Wt 241.1 lb

## 2019-10-31 DIAGNOSIS — K7469 Other cirrhosis of liver: Secondary | ICD-10-CM

## 2019-10-31 DIAGNOSIS — R188 Other ascites: Secondary | ICD-10-CM

## 2019-10-31 LAB — CBC WITH DIFFERENTIAL/PLATELET
Basophils Absolute: 0.1 10*3/uL (ref 0.0–0.1)
Basophils Relative: 1.5 % (ref 0.0–3.0)
Eosinophils Absolute: 0.1 10*3/uL (ref 0.0–0.7)
Eosinophils Relative: 1.6 % (ref 0.0–5.0)
HCT: 35.3 % — ABNORMAL LOW (ref 36.0–46.0)
Hemoglobin: 12.2 g/dL (ref 12.0–15.0)
Lymphocytes Relative: 18.4 % (ref 12.0–46.0)
Lymphs Abs: 0.8 10*3/uL (ref 0.7–4.0)
MCHC: 34.4 g/dL (ref 30.0–36.0)
MCV: 95.2 fl (ref 78.0–100.0)
Monocytes Absolute: 0.5 10*3/uL (ref 0.1–1.0)
Monocytes Relative: 11.8 % (ref 3.0–12.0)
Neutro Abs: 3.1 10*3/uL (ref 1.4–7.7)
Neutrophils Relative %: 66.7 % (ref 43.0–77.0)
Platelets: 30 10*3/uL — CL (ref 150.0–400.0)
RBC: 3.71 Mil/uL — ABNORMAL LOW (ref 3.87–5.11)
RDW: 17 % — ABNORMAL HIGH (ref 11.5–15.5)
WBC: 4.6 10*3/uL (ref 4.0–10.5)

## 2019-10-31 LAB — PROTIME-INR
INR: 1.4 ratio — ABNORMAL HIGH (ref 0.8–1.0)
Prothrombin Time: 15.1 s — ABNORMAL HIGH (ref 9.6–13.1)

## 2019-10-31 LAB — AMMONIA: Ammonia: 61 umol/L — ABNORMAL HIGH (ref 11–35)

## 2019-10-31 MED ORDER — SPIRONOLACTONE 50 MG PO TABS: 50.0000 mg | ORAL_TABLET | Freq: Every day | ORAL | 1 refills | Status: AC

## 2019-10-31 MED ORDER — FUROSEMIDE 20 MG PO TABS
20.0000 mg | ORAL_TABLET | Freq: Every day | ORAL | 1 refills | Status: AC
Start: 2019-10-31 — End: ?

## 2019-10-31 MED ORDER — RIFAXIMIN 550 MG PO TABS: 550.0000 mg | ORAL_TABLET | Freq: Two times a day (BID) | ORAL | 11 refills | Status: AC

## 2019-10-31 MED ORDER — LACTULOSE 10 GM/15ML PO SOLN: 30.0000 g | Freq: Two times a day (BID) | ORAL | 1 refills | Status: AC

## 2019-10-31 NOTE — Patient Instructions (Addendum)
If you are age 54 or older, your body mass index should be between 23-30. Your Body mass index is 40.13 kg/m. If this is out of the aforementioned range listed, please consider follow up with your Primary Care Provider.  If you are age 27 or younger, your body mass index should be between 19-25. Your Body mass index is 40.13 kg/m. If this is out of the aformentioned range listed, please consider follow up with your Primary Care Provider.   Your provider has requested that you go to the basement level for lab work before leaving today. Press "B" on the elevator. The lab is located at the first door on the left as you exit the elevator.  We are changing the way you take your medications. Please be sure to take the following medications the way we have prescribed them: Lactulose 30 grams twice a day Furosemide 20 mg 1 tablet a day Spironolactone 50 mg 1 tablet 1 tablet a day.  We have sent your demographic information and a prescription for Xifaxan to [facility]. This pharmacy is able to get medication approved through insurance and get you the lowest copay possible. If you have not heard from them within 1 week, please call our office at [phone] to let us know.  You have been scheduled to follow up with Dr. Orvan Falconer on December 02, 2019 at 10:30 am.

## 2019-10-31 NOTE — Progress Notes (Signed)
Reviewed and agree with management plans. ? ?Makella Buckingham L. Jams Trickett, MD, MPH  ?

## 2019-10-31 NOTE — Progress Notes (Signed)
CBC

## 2019-10-31 NOTE — Progress Notes (Signed)
Subjective:    Patient ID: Jessica Cline, female    DOB: 06-Mar-1966, 54 y.o.   MRN: [id number]  HPI Jessica Cline is a 54 year old white female, established with Dr. Tarri Glenn, who comes in today for post hospital follow-up after recent admission 7/6 through 10/06/2019.  Initially she had presented with what was felt to be an ileus which quickly resolved, and was found to have anasarca secondary to decompensated Nash/EtOH cirrhosis. She was followed by GI during this admission.  M ELD 17 She was gradually diuresed with IV albumin, Lasix and Aldactone.  She did undergo large-volume paracentesis which was negative for evidence of SBP. She had EGD on 10/05/2019 which showed grade 1 esophageal varices, no significant portal gastropathy. She was discharged home on Aldactone 100 mg p.o. daily and Lasix 40 mg daily.  I do not believe that prescription for Xifaxan was able to be filled. She has been on lactulose 15 cc 3 times daily but tells me today she is only having 1 bowel movement per day. Her weight is down from 268 on the day of discharge to 241 today. She says she does feel better with loss of all the excess fluid.  She has no complaints of abdominal discomfort today, generally feels okay. When asked about the specifics of her medications, she was not able to tell me which medications on her list she has been taking or what doses.  She did know the dose of lactulose.  Review of Systems Pertinent positive and negative review of systems were noted in the above HPI section.  All other review of systems was otherwise negative.  Outpatient Encounter Medications as of 10/31/2019  Medication Sig   albuterol (VENTOLIN HFA) 108 (90 Base) MCG/ACT inhaler Inhale 2 puffs into the lungs every 6 (six) hours as needed for wheezing or shortness of breath. Reported on 08/07/2015   COMBIVENT RESPIMAT 20-100 MCG/ACT AERS respimat Inhale 1 puff into the lungs in the morning, at noon, and at bedtime.    desmopressin (DDAVP)  0.01 % SOLN Place 1 spray into the nose 2 (two) times daily as needed (to prevent dehydration).    lactulose (CHRONULAC) 10 GM/15ML solution Take 45 mLs (30 g total) by mouth 2 (two) times daily. (titrate to 2-3 bowel movements daily)   rifaximin (XIFAXAN) 550 MG TABS tablet Take 1 tablet (550 mg total) by mouth 2 (two) times daily.   sertraline (ZOLOFT) 100 MG tablet Take 200 mg by mouth daily.    spironolactone (ALDACTONE) 50 MG tablet Take 1 tablet (50 mg total) by mouth daily.   traZODone (DESYREL) 100 MG tablet Take 200 mg by mouth at bedtime.    [DISCONTINUED] lactulose (CHRONULAC) 10 GM/15ML solution Take 15 mLs (10 g total) by mouth 3 (three) times daily. (titrate to 2-3 bowel movements daily)   [DISCONTINUED] rifaximin (XIFAXAN) 550 MG TABS tablet Take 1 tablet (550 mg total) by mouth 2 (two) times daily.   [DISCONTINUED] spironolactone (ALDACTONE) 100 MG tablet Take 1 tablet (100 mg total) by mouth 2 (two) times daily.   furosemide (LASIX) 20 MG tablet Take 1 tablet (20 mg total) by mouth daily.   [DISCONTINUED] furosemide (LASIX) 40 MG tablet Take 1 tablet (40 mg total) by mouth 2 (two) times daily.   No facility-administered encounter medications on file as of 10/31/2019.   Allergies  Allergen Reactions   Plasma, Human Swelling   Patient Active Problem List   Diagnosis Date Noted   Portal hypertension ([place])  Idiopathic esophageal varices without bleeding (HCC)    Anasarca    SBO (small bowel obstruction) ([place]) 09/27/2019   Splenomegaly 03/27/2016   Liver cirrhosis secondary to NASH ([place]) 08/07/2015   Lupus anticoagulant positive 01/24/2015   Allergic transfusion reaction 01/06/2015   Thrombocytopenia ([place]) 01/05/2015   Vaginal hemorrhage 01/05/2015   Abnormal uterine bleeding (AUB) 01/05/2015   Ascites due to alcoholic cirrhosis ([place]) 05/39/7673   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   Liver lesion, right lobe 06/01/2014   Social History    Socioeconomic History   Marital status: Single    Spouse name: Not on file   Number of children: 0   Years of education: Not on file   Highest education level: Not on file  Occupational History   Occupation: retired  Tobacco Use   Smoking status: Never Smoker   Smokeless tobacco: Current User    Types: Snuff  Vaping Use   Vaping Use: Never used  Substance and Sexual Activity   Alcohol use: Not Currently    Alcohol/week: 0.0 standard drinks   Drug use: Yes    Types: Marijuana   Sexual activity: Not Currently  Other Topics Concern   Not on file  Social History Narrative   Not on file   Social Determinants of Health   Financial Resource Strain:    Difficulty of Paying Living Expenses:   Food Insecurity:    Worried About Charity fundraiser in the Last Year:    Arboriculturist in the Last Year:   Transportation Needs:    Film/video editor (Medical):    Lack of Transportation (Non-Medical):   Physical Activity:    Days of Exercise per Week:    Minutes of Exercise per Session:   Stress:    Feeling of Stress :   Social Connections:    Frequency of Communication with Friends and Family:    Frequency of Social Gatherings with Friends and Family:    Attends Religious Services:    Active Member of Clubs or Organizations:    Attends Music therapist:    Marital Status:   Intimate Partner Violence:    Fear of Current or Ex-Partner:    Emotionally Abused:    Physically Abused:    Sexually Abused:     Ms. Hohler family history includes Breast cancer in her maternal grandmother; Pancreatic cancer in her father.      Objective:    Vitals:   10/31/19 0853  BP: 120/60  Pulse: 78    Physical Exam Well-developed well-nourished older white female in no acute distress.  She is walking with a walker today due to knee pain  Weight, 241 BMI 40.13 Weight is down 27 pounds since hospitalization HEENT; nontraumatic  normocephalic, EOMI, PE R LA, sclera anicteric. Oropharynx; not examined today Neck; supple, no JVD Cardiovascular; regular rate and rhythm with S1-S2, no murmur rub or gallop Pulmonary; Clear bilaterally Abdomen; soft, obese nondistended, no appreciable fluid wave no palpable mass or hepatosplenomegaly, bowel sounds are active Rectal; not done today Skin; benign exam, no jaundice rash or appreciable lesions Extremities; no clubbing cyanosis or edema skin warm and dry.  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 M ELD 17 at time of hospitalization. She has diuresed well and  weight is down 27 pounds since discharge.  #2 hepatic encephalopathy-stable on lactulose, though only having 1 bowel movement per day. #3 chronic thrombocytopenia #4 grade 1 esophageal varices/last EGD July 2021  #5 [facility] surveillance-negative CT July 2021 and normal AFP July 2021 #6 history of pituitary disorder #7 cholelithiasis  Plan; continue 2 g sodium diet CBC with differential, c-Met, venous ammonia, INR Increase lactulose to 30 cc twice daily We contacted patient's pharmacy and she is supposed to be on Aldactone 100 mg p.o. daily, and furosemide 40 mg daily.  Furosemide was not on her med list and spironolactone was listed as 100 mg twice daily. As she has had significant diuresis will decrease Lasix to 20 mg p.o. every morning and decrease Aldactone to 50 mg p.o. daily. Start Xifaxan 550 mg p.o. twice daily, authorization request sent through [employer]. Follow-up with Dr. Tarri Glenn in 4 to 5 weeks or sooner as needed  Jessica Battle Genia Harold PA-C 10/31/2019   Cc: [place], MD

## 2019-11-01 NOTE — Addendum Note (Signed)
Addended by: Arville Care on: 11/01/2019 03:51 PM   Modules accepted: Orders

## 2019-11-04 ENCOUNTER — Telehealth: Payer: Self-pay

## 2019-11-04 NOTE — Addendum Note (Signed)
Addended by: Arville Care on: 11/04/2019 03:16 PM   Modules accepted: Orders

## 2019-11-04 NOTE — Telephone Encounter (Signed)
Pt is requesting a call back from [facility]

## 2019-11-04 NOTE — Telephone Encounter (Signed)
Spoke with patient, she said going to [place] would be closer, so while she was on the phone I looked for a station in [place] and gave her the address to the one on [address]. She will go next week to have her lab drawn. An order has been faxed for labs.

## 2019-11-04 NOTE — Telephone Encounter (Signed)
Tried to contact patient about doing a lab. Since she lives in [place] I need to know if she would be able to go to the [employer] station in [place]. A voicemail has been left for this patient to call the office.

## 2019-11-10 ENCOUNTER — Telehealth: Payer: Self-pay | Admitting: Physician Assistant

## 2019-11-10 NOTE — Telephone Encounter (Signed)
I am not comfortable increasing diuretics   until we see what her labs are doing - needs to get CMET done - 2 gm sodium diet - also be sure has follow  up scheduled with Dr Orvan Falconer

## 2019-11-10 NOTE — Telephone Encounter (Signed)
Called the patient. No answer. Left her a voicemail. Order for lab and instruction for the 2 gram sodium diet faxed to [facility]

## 2019-11-10 NOTE — Telephone Encounter (Signed)
Patient last seen 10/31/19 and diuretics were decreased. Confirmed she is taking Lasix 20 mg daily, Aldactone 50 mg daily. She tells me she is taking Lactulose twice daily to produce 2 soft bowel movements a day.States compliance with her low sodium diet. Home health nurse out to see her today. Patient is calling with complaints of swelling in her knees and abdomen. She is starting to see it in her ankles as well. No sob. No pain. She has not had her labs drawn yet. She thinks she can get them done through home health next week if we send an order. Please advise on the edema. Thanks

## 2019-11-11 ENCOUNTER — Inpatient Hospital Stay (HOSPITAL_BASED_OUTPATIENT_CLINIC_OR_DEPARTMENT_OTHER): Payer: Medicare Other | Admitting: Oncology

## 2019-11-11 ENCOUNTER — Encounter: Payer: Self-pay | Admitting: Oncology

## 2019-11-11 ENCOUNTER — Other Ambulatory Visit: Payer: Self-pay

## 2019-11-11 ENCOUNTER — Inpatient Hospital Stay: Payer: Medicare Other | Attending: Oncology

## 2019-11-11 VITALS — BP 139/53 | HR 77 | Temp 96.3°F | Resp 16 | Wt 242.3 lb

## 2019-11-11 DIAGNOSIS — D696 Thrombocytopenia, unspecified: Secondary | ICD-10-CM | POA: Diagnosis present

## 2019-11-11 DIAGNOSIS — K7460 Unspecified cirrhosis of liver: Secondary | ICD-10-CM | POA: Diagnosis not present

## 2019-11-11 DIAGNOSIS — K7030 Alcoholic cirrhosis of liver without ascites: Secondary | ICD-10-CM | POA: Diagnosis not present

## 2019-11-11 LAB — CBC WITH DIFFERENTIAL/PLATELET
Abs Immature Granulocytes: 0.01 10*3/uL (ref 0.00–0.07)
Basophils Absolute: 0 10*3/uL (ref 0.0–0.1)
Basophils Relative: 0 %
Eosinophils Absolute: 0.1 10*3/uL (ref 0.0–0.5)
Eosinophils Relative: 2 %
HCT: 36.5 % (ref 36.0–46.0)
Hemoglobin: 12.4 g/dL (ref 12.0–15.0)
Immature Granulocytes: 0 %
Lymphocytes Relative: 17 %
Lymphs Abs: 0.7 10*3/uL (ref 0.7–4.0)
MCH: 31 pg (ref 26.0–34.0)
MCHC: 34 g/dL (ref 30.0–36.0)
MCV: 91.3 fL (ref 80.0–100.0)
Monocytes Absolute: 0.3 10*3/uL (ref 0.1–1.0)
Monocytes Relative: 9 %
Neutro Abs: 2.7 10*3/uL (ref 1.7–7.7)
Neutrophils Relative %: 72 %
Platelets: 28 10*3/uL — CL (ref 150–400)
RBC: 4 MIL/uL (ref 3.87–5.11)
RDW: 15.3 % (ref 11.5–15.5)
Smear Review: NORMAL
WBC: 3.8 10*3/uL — ABNORMAL LOW (ref 4.0–10.5)
nRBC: 0 % (ref 0.0–0.2)

## 2019-11-11 NOTE — Telephone Encounter (Signed)
Confirmed with the patient that she received my message. She says she did.

## 2019-11-11 NOTE — Progress Notes (Signed)
Hematology/Oncology Consult note [facility]  Telephone:([phone] [phone]  Patient Care Team: Driggers, Barbaraann Boys as PCP - General (Physician Assistant) Willis Modena, MD as Consulting Physician (Gastroenterology) Creig Hines, MD as Consulting Physician (Hematology and Oncology) Tressia Danas, MD as Consulting Physician (Gastroenterology)   Name of the patient: Jessica Cline  [id number]  05-06-1965   Date of visit: 11/11/19  Diagnosis- thrombocytopenia likely secondary to cirrhosis of the liver  Chief complaint/ Reason for visit-routine follow-up of thrombocytopenia  Heme/Onc history: Patient is a 54 year old female with a history of chronic liver disease and cirrhosis likely secondary to Parker. She is seen in [facility] for this. 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 She had previously seen Dr. Merlene Pulling and transferring her care to me. She has a history of positive lupus anticoagulant but no history of thrombosis. 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 Hepatitis B C and HIV testing was negative. She had also presented with vaginal hemorrhage back in 2016. She required PRBC and platelet transfusion at that time and developed an allergic reaction to platelets. She will subsequently require premedications with Benadryl and steroids. Von Willebrand panel testing was negative.  Interval history-patient was admitted to the hospital in July 2021 for decompensated ascites.  She is currently on diuretics and continues to follow-up with GI as well.  Presently reports fatigue and has chronic bilateral upper extremity bruising.  Denies any blood in her stool or urine.  Denies any dark melanotic stools. ECOG PS- 2 Pain scale- 0 Opioid associated constipation- no  Review of systems- Review of Systems  Constitutional: Positive for malaise/fatigue. Negative for chills, fever and weight loss.  HENT: Negative for  congestion, ear discharge and nosebleeds.   Eyes: Negative for blurred vision.  Respiratory: Negative for cough, hemoptysis, sputum production, shortness of breath and wheezing.   Cardiovascular: Negative for chest pain, palpitations, orthopnea and claudication.  Gastrointestinal: Negative for abdominal pain, blood in stool, constipation, diarrhea, heartburn, melena, nausea and vomiting.  Genitourinary: Negative for dysuria, flank pain, frequency, hematuria and urgency.  Musculoskeletal: Negative for back pain, joint pain and myalgias.  Skin: Negative for rash.  Neurological: Negative for dizziness, tingling, focal weakness, seizures, weakness and headaches.  Endo/Heme/Allergies: Does not bruise/bleed easily.  Psychiatric/Behavioral: Negative for depression and suicidal ideas. The patient does not have insomnia.       Allergies  Allergen Reactions  . Plasma, Human Swelling     Past Medical History:  Diagnosis Date  . Anxiety   . Arthritis   . Blood transfusion without reported diagnosis   . Depression   . HTN (hypertension)   . Thrombocytopenia (HCC)      Past Surgical History:  Procedure Laterality Date  . ANKLE SURGERY Left   . ESOPHAGOGASTRODUODENOSCOPY (EGD) WITH PROPOFOL N/A 10/05/2019   Procedure: ESOPHAGOGASTRODUODENOSCOPY (EGD) WITH PROPOFOL;  Surgeon: Iva Boop, MD;  Location: [facility];  Service: Endoscopy;  Laterality: N/A;  . KNEE ARTHROSCOPY Left   . OVARY SURGERY Right    due to cyst or tumor  . PITUITARY SURGERY     tumor removed  . thumb SURGERY    . TONSILLECTOMY    . WRIST SURGERY Left     Social History   Socioeconomic History  . Marital status: Single    Spouse name: Not on file  . Number of children: 0  . Years of education: Not on file  . Highest education level: Not on file  Occupational History  .  Occupation: retired  Tobacco Use  . Smoking status: Never Smoker  . Smokeless tobacco: Current User    Types: Snuff  Vaping Use    . Vaping Use: Never used  Substance and Sexual Activity  . Alcohol use: Not Currently    Alcohol/week: 0.0 standard drinks  . Drug use: Yes    Types: Marijuana  . Sexual activity: Not Currently  Other Topics Concern  . Not on file  Social History Narrative  . Not on file   Social Determinants of Health   Financial Resource Strain:   . Difficulty of Paying Living Expenses: Not on file  Food Insecurity:   . Worried About Programme researcher, broadcasting/film/video in the Last Year: Not on file  . Ran Out of Food in the Last Year: Not on file  Transportation Needs:   . Lack of Transportation (Medical): Not on file  . Lack of Transportation (Non-Medical): Not on file  Physical Activity:   . Days of Exercise per Week: Not on file  . Minutes of Exercise per Session: Not on file  Stress:   . Feeling of Stress : Not on file  Social Connections:   . Frequency of Communication with Friends and Family: Not on file  . Frequency of Social Gatherings with Friends and Family: Not on file  . Attends Religious Services: Not on file  . Active Member of Clubs or Organizations: Not on file  . Attends Banker Meetings: Not on file  . Marital Status: Not on file  Intimate Partner Violence:   . Fear of Current or Ex-Partner: Not on file  . Emotionally Abused: Not on file  . Physically Abused: Not on file  . Sexually Abused: Not on file    Family History  Problem Relation Age of Onset  . Pancreatic cancer Father   . Breast cancer Maternal Grandmother      Current Outpatient Medications:  .  albuterol (VENTOLIN HFA) 108 (90 Base) MCG/ACT inhaler, Inhale 2 puffs into the lungs every 6 (six) hours as needed for wheezing or shortness of breath. Reported on 08/07/2015, Disp: 8 g, Rfl: 1 .  COMBIVENT RESPIMAT 20-100 MCG/ACT AERS respimat, Inhale 1 puff into the lungs in the morning, at noon, and at bedtime. , Disp: , Rfl:  .  desmopressin (DDAVP) 0.01 % SOLN, Place 1 spray into the nose 2 (two) times  daily as needed (to prevent dehydration). , Disp: , Rfl:  .  furosemide (LASIX) 20 MG tablet, Take 1 tablet (20 mg total) by mouth daily., Disp: 30 tablet, Rfl: 1 .  lactulose (CHRONULAC) 10 GM/15ML solution, Take 45 mLs (30 g total) by mouth 2 (two) times daily. (titrate to 2-3 bowel movements daily), Disp: 2700 mL, Rfl: 1 .  rifaximin (XIFAXAN) 550 MG TABS tablet, Take 1 tablet (550 mg total) by mouth 2 (two) times daily., Disp: 60 tablet, Rfl: 11 .  sertraline (ZOLOFT) 100 MG tablet, Take 200 mg by mouth daily. , Disp: , Rfl:  .  spironolactone (ALDACTONE) 50 MG tablet, Take 1 tablet (50 mg total) by mouth daily., Disp: 30 tablet, Rfl: 1 .  traZODone (DESYREL) 100 MG tablet, Take 200 mg by mouth at bedtime. , Disp: , Rfl:   Physical exam:  Vitals:   11/11/19 1126  BP: (!) 139/53  Pulse: 77  Resp: 16  Temp: (!) 96.3 F (35.7 C)  TempSrc: Tympanic  SpO2: 98%  Weight: 242 lb 4.8 oz (109.9 kg)   Physical Exam Constitutional:  Comments: Sitting in a wheelchair.  Appears fatigued  Cardiovascular:     Rate and Rhythm: Normal rate and regular rhythm.     Heart sounds: Normal heart sounds.  Pulmonary:     Effort: Pulmonary effort is normal.     Breath sounds: Normal breath sounds.  Abdominal:     General: Bowel sounds are normal.     Palpations: Abdomen is soft.     Comments: Nondistended  Musculoskeletal:     Comments: Mild scattered bruising noted over bilateral forearms  Skin:    General: Skin is warm and dry.  Neurological:     Mental Status: She is alert and oriented to person, place, and time.      CMP Latest Ref Rng & Units 10/06/2019  Glucose 70 - 99 mg/dL 90  BUN 6 - 20 mg/dL 12  Creatinine 3.01 - 6.01 mg/dL 0.93  Sodium 235 - 573 mmol/L 144  Potassium 3.5 - 5.1 mmol/L 3.8  Chloride 98 - 111 mmol/L 104  CO2 22 - 32 mmol/L 27  Calcium 8.9 - 10.3 mg/dL 9.3  Total Protein 6.5 - 8.1 g/dL 6.1(L)  Total Bilirubin 0.3 - 1.2 mg/dL 8.1(H)  Alkaline Phos 38 - 126 U/L  76  AST 15 - 41 U/L 46(H)  ALT 0 - 44 U/L 27   CBC Latest Ref Rng & Units 11/11/2019  WBC 4.0 - 10.5 K/uL 3.8(L)  Hemoglobin 12.0 - 15.0 g/dL 22.0  Hematocrit 36 - 46 % 36.5  Platelets 150 - 400 K/uL 28(LL)     Assessment and plan- Patient is a 54 y.o. female with chronic thrombocytopenia secondary to hepatic cirrhosis here for routine follow-up  Patient continues to have significant thrombocytopenia with a platelet count that fluctuates between 20-30 but has remained stable in this range over the last 1 year.  She is not anemic.  Suspect thrombocytopenia secondary to cirrhosis.  I had given her a trial of steroids in the past as well which did not help indicating that it is not likely ITP is causing her thrombocytopenia.  Given the stability of her counts I will repeat her CBC with differential In 3 in 6 months and see her back in 6 months   Visit Diagnosis 1. Thrombocytopenia (HCC)   2. Hepatic cirrhosis, unspecified hepatic cirrhosis type, unspecified whether ascites present [facility])      Dr. Owens Shark, MD, MPH [facility] [phone] 11/11/2019 3:41 PM

## 2019-12-02 ENCOUNTER — Other Ambulatory Visit (INDEPENDENT_AMBULATORY_CARE_PROVIDER_SITE_OTHER): Payer: Medicare Other

## 2019-12-02 ENCOUNTER — Ambulatory Visit (INDEPENDENT_AMBULATORY_CARE_PROVIDER_SITE_OTHER)
Admission: RE | Admit: 2019-12-02 | Discharge: 2019-12-02 | Disposition: A | Discharge status: Home / Self Care | Payer: Medicare Other | Source: Ambulatory Visit | Attending: Gastroenterology | Admitting: Gastroenterology

## 2019-12-02 ENCOUNTER — Ambulatory Visit (INDEPENDENT_AMBULATORY_CARE_PROVIDER_SITE_OTHER): Payer: Medicare Other | Admitting: Gastroenterology

## 2019-12-02 ENCOUNTER — Encounter: Payer: Self-pay | Admitting: Gastroenterology

## 2019-12-02 ENCOUNTER — Other Ambulatory Visit: Payer: Self-pay

## 2019-12-02 VITALS — BP 124/70 | HR 80 | Ht 60.0 in | Wt 252.1 lb

## 2019-12-02 DIAGNOSIS — K7469 Other cirrhosis of liver: Secondary | ICD-10-CM

## 2019-12-02 DIAGNOSIS — R109 Unspecified abdominal pain: Secondary | ICD-10-CM

## 2019-12-02 DIAGNOSIS — D696 Thrombocytopenia, unspecified: Secondary | ICD-10-CM | POA: Diagnosis not present

## 2019-12-02 DIAGNOSIS — R188 Other ascites: Secondary | ICD-10-CM

## 2019-12-02 LAB — COMPREHENSIVE METABOLIC PANEL
ALT: 29 U/L (ref 0–35)
AST: 51 U/L — ABNORMAL HIGH (ref 0–37)
Albumin: 3.6 g/dL (ref 3.5–5.2)
Alkaline Phosphatase: 137 U/L — ABNORMAL HIGH (ref 39–117)
BUN: 12 mg/dL (ref 6–23)
CO2: 28 mEq/L (ref 19–32)
Calcium: 9.1 mg/dL (ref 8.4–10.5)
Chloride: 103 mEq/L (ref 96–112)
Creatinine, Ser: 0.89 mg/dL (ref 0.40–1.20)
GFR: 66.1 mL/min (ref >60.00)
Glucose, Bld: 95 mg/dL (ref 70–99)
Potassium: 3.8 mEq/L (ref 3.5–5.1)
Sodium: 138 mEq/L (ref 135–145)
Total Bilirubin: 4 mg/dL — ABNORMAL HIGH (ref 0.2–1.2)
Total Protein: 6.8 g/dL (ref 6.0–8.3)

## 2019-12-02 LAB — CBC
HCT: 38.1 % (ref 36.0–46.0)
Hemoglobin: 12.8 g/dL (ref 12.0–15.0)
MCHC: 33.7 g/dL (ref 30.0–36.0)
MCV: 94.5 fl (ref 78.0–100.0)
Platelets: 33 10*3/uL — CL (ref 150.0–400.0)
RBC: 4.03 Mil/uL (ref 3.87–5.11)
RDW: 15.8 % — ABNORMAL HIGH (ref 11.5–15.5)
WBC: 4.8 10*3/uL (ref 4.0–10.5)

## 2019-12-02 MED ORDER — NADOLOL 40 MG PO TABS: 40.0000 mg | ORAL_TABLET | Freq: Every day | ORAL | 3 refills | Status: AC

## 2019-12-02 NOTE — Patient Instructions (Addendum)
Your provider has requested that you have an abdominal x ray before leaving today. Please go to the basement floor to our Radiology department for the test.  Your provider has requested that you go to the basement level for lab work before leaving today. Press "B" on the elevator. The lab is located at the first door on the left as you exit the elevator.  Start: Nadolol 40mg  - once at bedtime.   Avoid all NSAIDs.  We have sent the following medications to your pharmacy for you to pick up at your convenience: Nadolol   Consider Cologuard.   Try to follow a 2000mg  sodium diet. Please see handout.   You will need a office follow up in 6 months. Office will contact you to schedule.   If you are age 91 or older, your body mass index should be between 23-30. Your Body mass index is 49.24 kg/m. If this is out of the aforementioned range listed, please consider follow up with your Primary Care Provider.  If you are age 69 or younger, your body mass index should be between 19-25. Your Body mass index is 49.24 kg/m. If this is out of the aformentioned range listed, please consider follow up with your Primary Care Provider.   Due to recent changes in healthcare laws, you may see the results of your imaging and laboratory studies on MyChart before your provider has had a chance to review them.  We understand that in some cases there may be results that are confusing or concerning to you. Not all laboratory results come back in the same time frame and the provider may be waiting for multiple results in order to interpret others.  Please give 77 48 hours in order for your provider to thoroughly review all the results before contacting the office for clarification of your results.   Thank you for choosing me and [facility].  Dr.Kimberly Beavers

## 2019-12-02 NOTE — Progress Notes (Signed)
Referring Provider: Driggers, Hewitt Shorts, PA-C Primary Care Physician:  Marlene Lard, PA-C   Chief complaint:  Liver disease with ascites  IMPRESSION:  Cirrhosis by imaging with associated portal hypertension/thrombocytopenia    - MELD 18 based on August 2021 labs    - likely due to prior alcohol +/- fatty liver    - Negative labs for HBV, HCV, HIV, negative iron profile and ferritin, negative ANA    - completed vaccination for Twinrix    - EGD 10/05/19: grade 1 esophageal varices, no significant portal hypertensive gastropathy    - not interested in liver transplantation Hospitalization for ileus versus pSBO 09/2019 Profound thrombocytopenia Ascites    - LVP 09/2019    - started diuretics during hospitalization 09/2019 Intermittent RUQ pain that may be related to capsular stretch Obesity with BMI 40 History of heavy alcohol use    - drinking a pint daily starting at age 60    - no alcohol in over 20 years Colonoscopy in her 4s at [facility]    - Dr. 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  Will alternate ultrasound with MRI.  Annual flu vaccine recommended.  Acute change in bowel habits with recent hospitalization for ileus versus partial SBO: We will obtain labs today to screen for electrolyte abnormalities.  3-way of the abdomen looking for obstruction versus ileus this afternoon.  Ascites: Reviewed need for 2000 mg sodium restricted diet.  Continue diuretics.  Declines referral to nutritionist to better control dietary compliance.  I have again reminded her to avoid all NSAIDs to minimize renal dysfunction.  Small esophageal varices on recent EGD: She has been on nadolol.  We will continue at this time with plans to hold if there is any evidence for renal insufficiency.  Profound  thrombocytopenia limits her candidacy for endoscopic evaluation.  Limits her candidacy for colonoscopy for colon cancer screening.  I am afraid she is also not a candidate for surgery given her decompensated liver disease.  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 She returns in scheduled follow-up.   The interval history is obtained to the patient and review of her electronic health record.  She was hospitalized in July for an ileus that resolved within 24 hours without intervention. Has had ongoing problems with management of ascites since that time. Diuresed with IV albumin, Lasix and Aldactone and had a LVP while hospitalized.  She was discharged home on Aldactone 100 mg p.o. daily and Lasix 40 mg daily.  Continues to watch her sodium although notes her edema and ascites fluctuates from day to day. Feels that she is extremely compliant with a low sodium diet. Feels that she does not eat even 200 mg of sodium daily.   She is concerned that she may have another "blockage" with symptoms similar to her ileus in July. Normally has multiple daily  bowel movements. However, now she has not had a bowel movement in 2 days.   Frustrated that her health over overall declined. Very motivated to go fishing. Stopped by left knee pain and her walker.   She is not drinking any alcohol.   Labs from 10/31/19: Sodium 144, creatinine 0.93, total bilirubin 8.1,  AST 46, ALT 27, alk phos 76, hemoglobin 9.9, platelets 21, INR 1.4 for a calculated meld score of 18  Abdominal imaging Abdominal ultrasound 05/05/17:  Stable cirrhosis Abdominal ultrasound 11/25/17: cirrhosis, normal flow of the portal vein, gallbladder wall thickening Abdominal ultrasound 05/26/18: cirrhosis, new ascites MRI 12/01/18: cirrhosis, splenomegaly, patent portal vein, small gastric varices, no ascites, no HCC CT abd/pelvis 09/27/19: proximal SBO, ascites, cirrhosis, splenomegaly, cholelithiasis Doppler ultrasound 10/01/19: ascites, no portal vein thrombus, cirrhosis, cholelithiasis  Endoscopic history: Upper endoscopy in 2016 ([facility]) showed portal gastropathy but no esophageal or gastric varices.  He was recommending a colonoscopy in 2017.  EGD 10/05/19: grade 1 esophageal varices, no significant portal hypertensive gastropathy meld score calculator Past Medical History:  Diagnosis Date  . Anxiety   . Arthritis   . Blood transfusion without reported diagnosis   . Depression   . HTN (hypertension)   . Thrombocytopenia ([place])     Past Surgical History:  Procedure Laterality Date  . ANKLE SURGERY Left   . ESOPHAGOGASTRODUODENOSCOPY (EGD) WITH PROPOFOL N/A 10/05/2019   Procedure: ESOPHAGOGASTRODUODENOSCOPY (EGD) WITH PROPOFOL;  Surgeon: Gatha Mayer, MD;  Location: [facility];  Service: Endoscopy;  Laterality: N/A;  . KNEE ARTHROSCOPY Left   . OVARY SURGERY Right    due to cyst or tumor  . PITUITARY SURGERY     tumor removed  . thumb SURGERY    . TONSILLECTOMY    . WRIST SURGERY Left     Current Outpatient Medications  Medication Sig Dispense Refill  . albuterol (VENTOLIN HFA) 108 (90 Base) MCG/ACT inhaler Inhale 2 puffs into the lungs every 6 (six) hours as needed for wheezing or shortness of breath. Reported on 08/07/2015 8 g 1  . COMBIVENT RESPIMAT 20-100 MCG/ACT AERS respimat Inhale 1 puff into the lungs in the morning, at noon, and at bedtime.     . furosemide (LASIX)  20 MG tablet Take 1 tablet (20 mg total) by mouth daily. 30 tablet 1  . lactulose (CHRONULAC) 10 GM/15ML solution Take by mouth 3 (three) times daily.    . sertraline (ZOLOFT) 100 MG tablet Take 200 mg by mouth daily.     Marland Kitchen spironolactone (ALDACTONE) 50 MG tablet Take 1 tablet (50 mg total) by mouth daily. 30 tablet 1  . traZODone (DESYREL) 100 MG tablet Take 200 mg by mouth at bedtime.      No current facility-administered medications for this visit.    Allergies as of 12/02/2019 - Review Complete 12/02/2019  Allergen Reaction Noted  . Plasma, human Swelling 08/07/2015    Family History  Problem Relation Age of Onset  . Pancreatic cancer Father   . Breast cancer Maternal Grandmother     Social History   Socioeconomic History  . Marital status: Single    Spouse name: Not on file  . Number of children: 0  . Years of education: Not on file  . Highest education level: Not on file  Occupational History  . Occupation: retired  Tobacco Use  . Smoking status: Never Smoker  . Smokeless tobacco: Current User    Types: Snuff  Vaping Use  . Vaping Use: Never used  Substance and Sexual Activity  . Alcohol use: Not Currently    Alcohol/week: 0.0 standard drinks  . Drug use: Yes    Types: Marijuana  . Sexual activity: Not Currently  Other Topics Concern  . Not on file  Social History Narrative  . Not on file   Social Determinants of Health   Financial Resource Strain:   . Difficulty of Paying Living Expenses: Not on file  Food Insecurity:   . Worried About Charity fundraiser in the Last Year: Not on file  . Ran Out of Food in the Last Year: Not on file  Transportation Needs:   . Lack of Transportation (Medical): Not on file  . Lack of Transportation (Non-Medical): Not on file  Physical Activity:   . Days of Exercise per Week: Not on file  . Minutes of Exercise per Session: Not on file  Stress:   . Feeling of Stress : Not on file  Social Connections:   . Frequency of  Communication with Friends and Family: Not on file  . Frequency of Social Gatherings with Friends and Family: Not on file  . Attends Religious Services: Not on file  . Active Member of Clubs or Organizations: Not on file  . Attends Archivist Meetings: Not on file  . Marital Status: Not on file  Intimate Partner Violence:   . Fear of Current or Ex-Partner: Not on file  . Emotionally Abused: Not on file  . Physically Abused: Not on file  . Sexually Abused: Not on file    Physical Exam: General:   Alert, in NAD.  Mild scleral icterus. No bilateral temporal wasting.  Walking with a walker. Heart:  Regular rate and rhythm; no murmurs Pulm: Clear anteriorly; no wheezing Abdomen:  Soft.  Central obesity.  Nontender. Nondistended. Normal bowel sounds. No rebound or guarding. No obvious fluid wave.  LAD: No inguinal or umbilical LAD Extremities: Trace lower extremity edema. Neurologic:  Alert and  oriented x4;  grossly normal neurologically; no asterixis or clonus. Skin: No jaundice. No palmar erythema or spider angioma.  Psych:  Alert and cooperative. Normal mood and affect.   Jessica Mahler L. Tarri Glenn, MD, MPH [facility] 12/02/2019, 10:50 AM

## 2019-12-05 ENCOUNTER — Other Ambulatory Visit: Payer: Self-pay

## 2019-12-05 DIAGNOSIS — R197 Diarrhea, unspecified: Secondary | ICD-10-CM

## 2020-02-09 ENCOUNTER — Inpatient Hospital Stay: Payer: Medicare Other | Attending: Oncology

## 2020-03-24 DEATH — deceased

## 2020-05-14 ENCOUNTER — Inpatient Hospital Stay: Payer: Self-pay

## 2020-05-14 ENCOUNTER — Inpatient Hospital Stay: Payer: Self-pay | Admitting: Oncology

## 2022-03-13 IMAGING — CT CT ABD-PELV W/ CM
2 of 5 series · 15 of 46 positions shown, 17 images · IV contrast (omnipaque)
Comparison: None.

CLINICAL DATA: Abdominal distension and low platelets.

EXAM:
CT ABDOMEN AND PELVIS WITH CONTRAST
TECHNIQUE: Multidetector CT imaging of the abdomen and pelvis was performed
using the standard protocol following bolus administration of
intravenous contrast.
CONTRAST:  100mL OMNIPAQUE IOHEXOL 300 MG/ML  SOLN

[Series 2: axial st · axial · 0.90mm/px · z∈[-624,-184]mm · 12 of 106 slices shown, 14 images]
[im 9/106  soft-tissue]
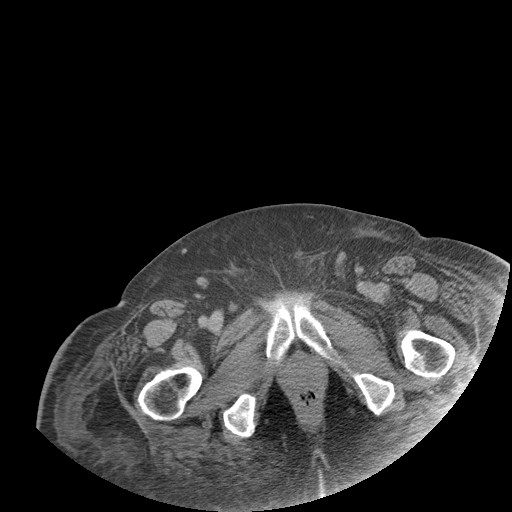
[im 9/106  bone]
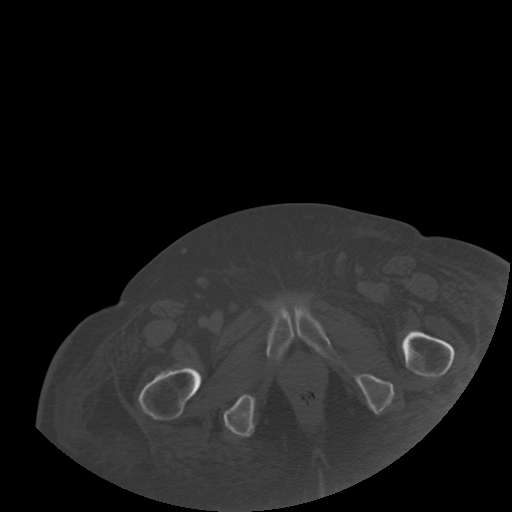
[im 17/106  soft-tissue]
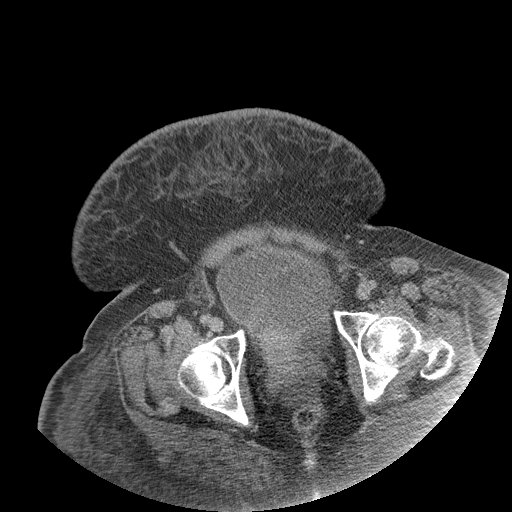
[im 25/106  soft-tissue]
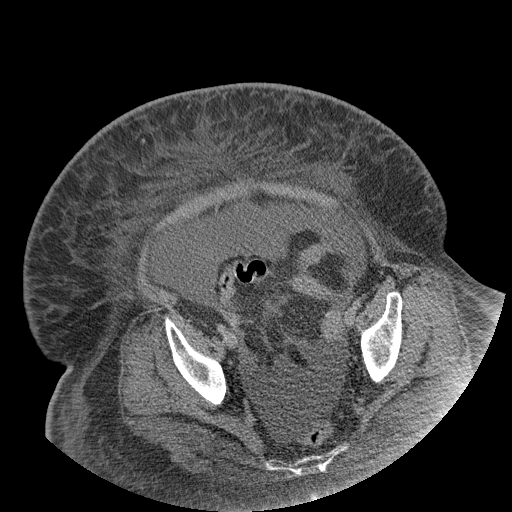
[im 33/106  soft-tissue]
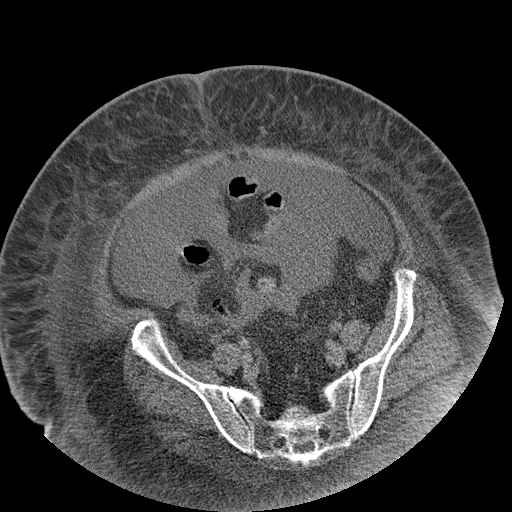
[im 41/106  soft-tissue]
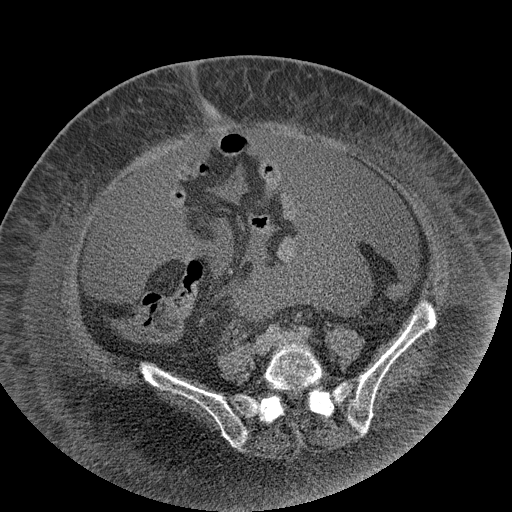
[im 49/106  soft-tissue]
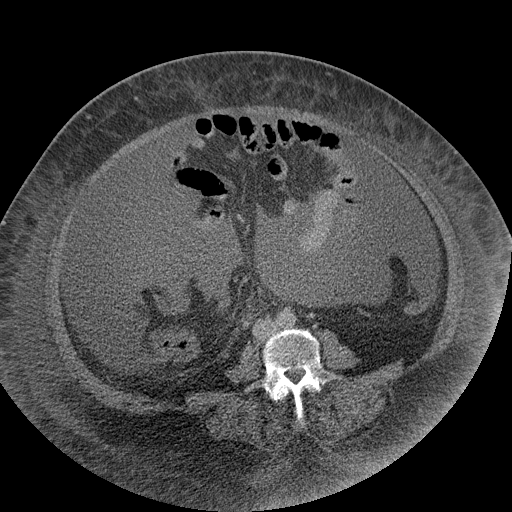
[im 57/106  soft-tissue]
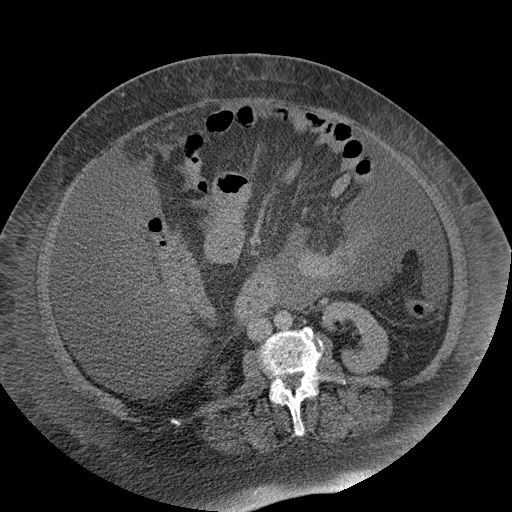
[im 65/106  soft-tissue]
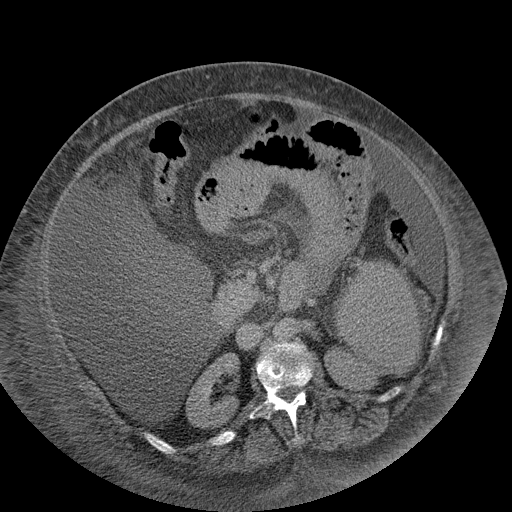
[im 73/106  soft-tissue]
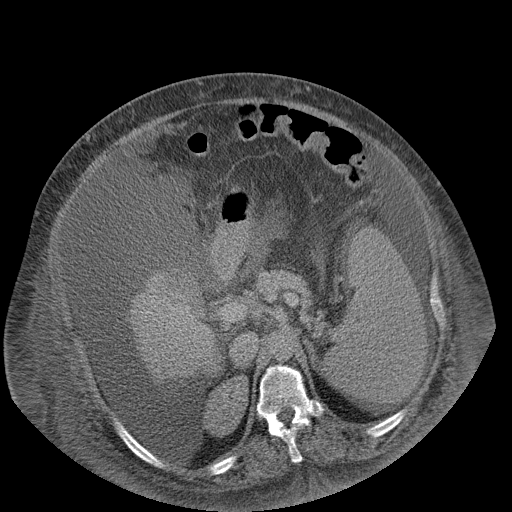
[im 73/106  bone]
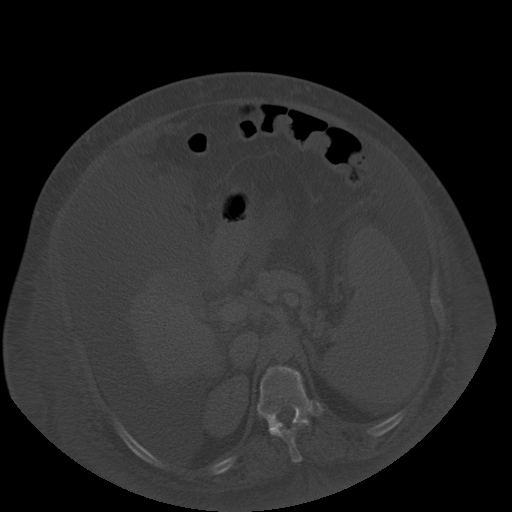
[im 81/106  soft-tissue]
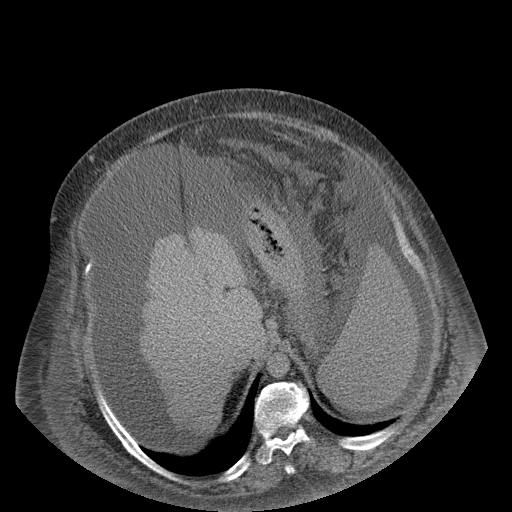
[im 89/106  soft-tissue]
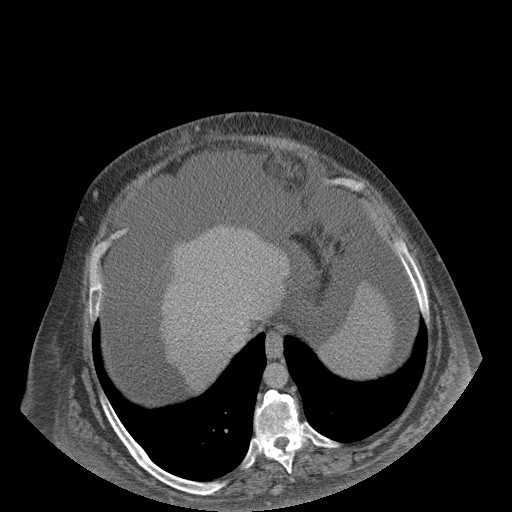
[im 97/106  soft-tissue]
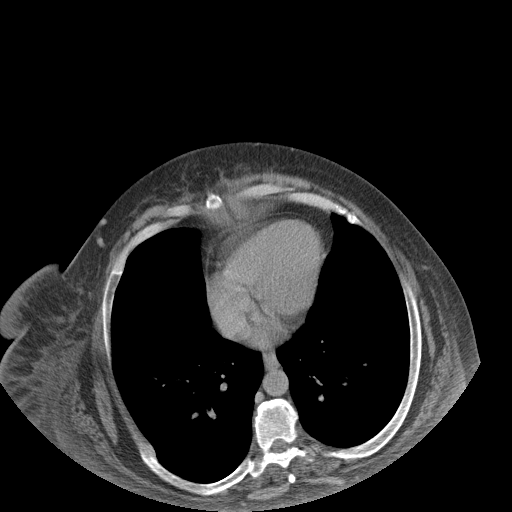

[Series 4: coronal st · coronal · 0.97mm/px · 3 of 212 slices shown]
[im 71/212  soft-tissue]
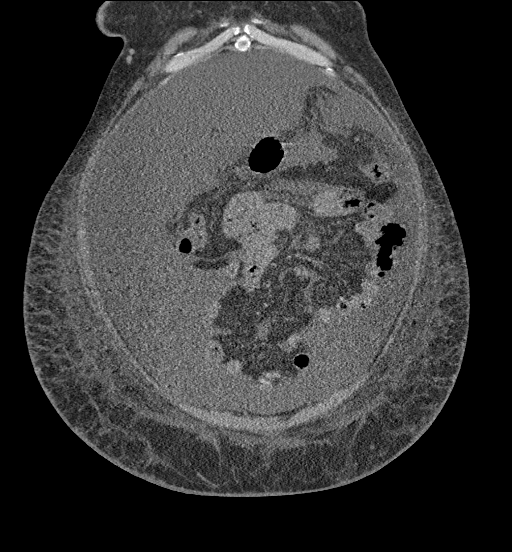
[im 94/212  soft-tissue]
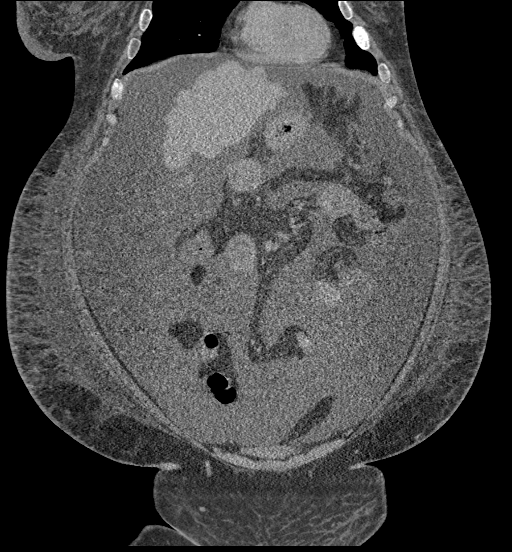
[im 118/212  soft-tissue]
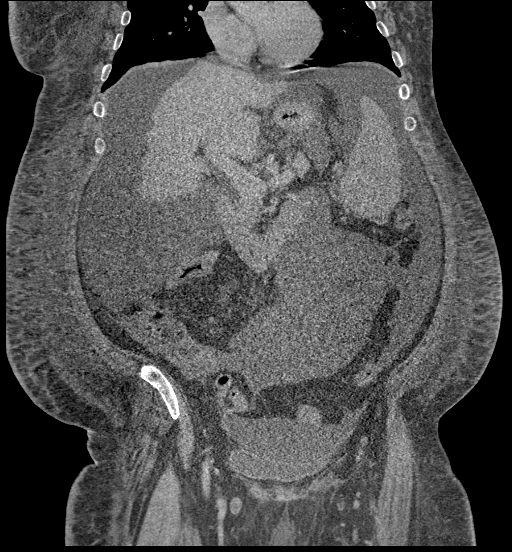

[15 of 46 positions shown; findings below may reference images not displayed]

FINDINGS: Lower chest: No acute abnormality.

Hepatobiliary: The liver is cirrhotic in appearance. Numerous tiny
gallstones are seen within the lumen of a normal appearing
gallbladder.

Pancreas: Unremarkable. No pancreatic ductal dilatation or
surrounding inflammatory changes.

Spleen: There is mild to moderate severity splenomegaly.

Adrenals/Urinary Tract: Adrenal glands are unremarkable. Kidneys are
normal, without renal calculi, focal lesion, or hydronephrosis.
Bladder is unremarkable.

Stomach/Bowel: Stomach is within normal limits. The appendix is not
clearly identified. A dilated loop of mid to distal small bowel is
seen within the mid abdomen (maximum small bowel diameter of
approximately 4.4 cm). A gradual transition zone is seen along the
anterior medial aspect of the mid abdomen.

Vascular/Lymphatic: No significant vascular findings are present. No
enlarged abdominal or pelvic lymph nodes.

Reproductive: Uterus and bilateral adnexa are unremarkable.

Other: No abdominal wall hernia or abnormality.

A large amount of nonhemorrhagic abdominal and pelvic free fluid is
noted (approximately 12.16 Hounsfield units).

Musculoskeletal: Moderate to marked severity anasarca is seen
throughout the abdominal and pelvic walls.

Multiple chronic posterolateral right-sided rib fractures are seen.

Degenerative changes seen within the lumbar spine.
IMPRESSION: 1. Findings consistent with a proximal small bowel obstruction.
2. Large amount of nonhemorrhagic abdominal and pelvic free fluid.
3. Cirrhosis with splenomegaly.
4. Cholelithiasis.

## 2022-03-17 IMAGING — US US HEPATIC LIVER DOPPLER
2 of 3 series · 13 of 25 positions shown · non-contrast
Comparison: CT 09/27/2019

CLINICAL DATA: Cirrhosis

EXAM:
DUPLEX ULTRASOUND OF LIVER
TECHNIQUE: Color and duplex Doppler ultrasound was performed to evaluate the
hepatic in-flow and out-flow vessels. Technologist describes
technically difficult study secondary to body habitus and bowel
gas.

[Series 1: us hepatic liver doppler · 12 of 74 slices shown (1 of 2)]
[im 1/74]
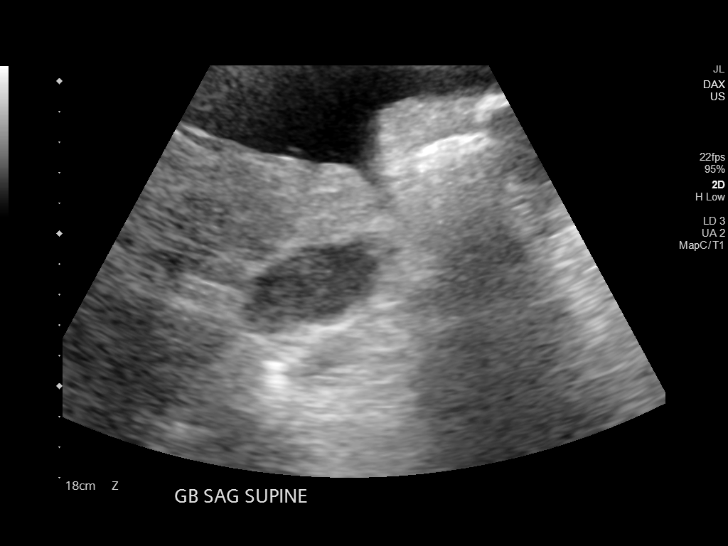
[im 7/74]
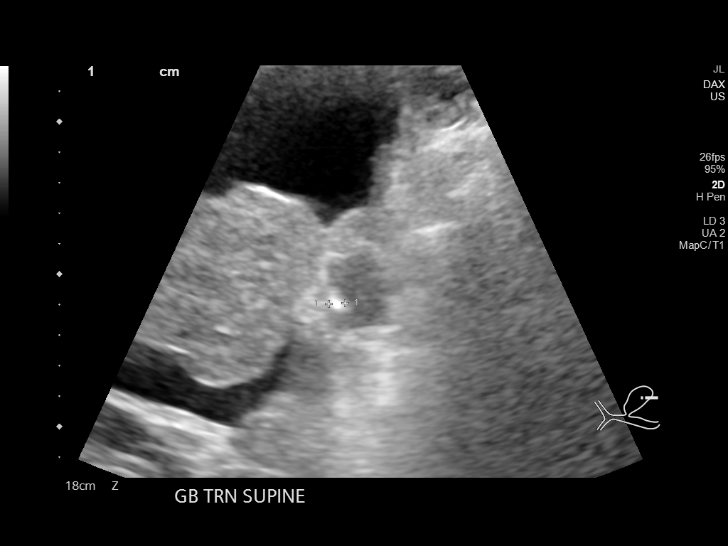
[im 14/74]
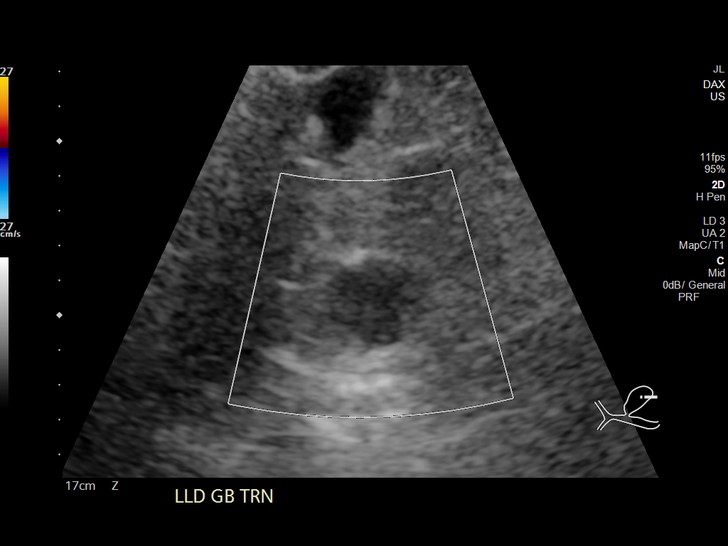
[im 20/74]
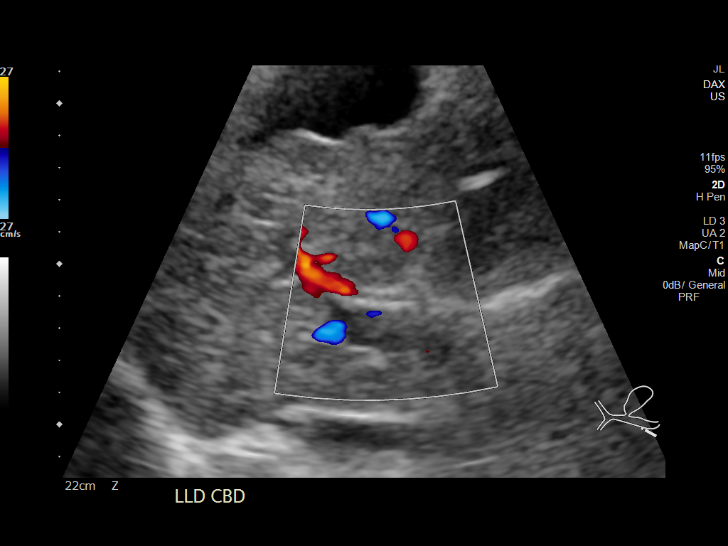
[im 27/74]
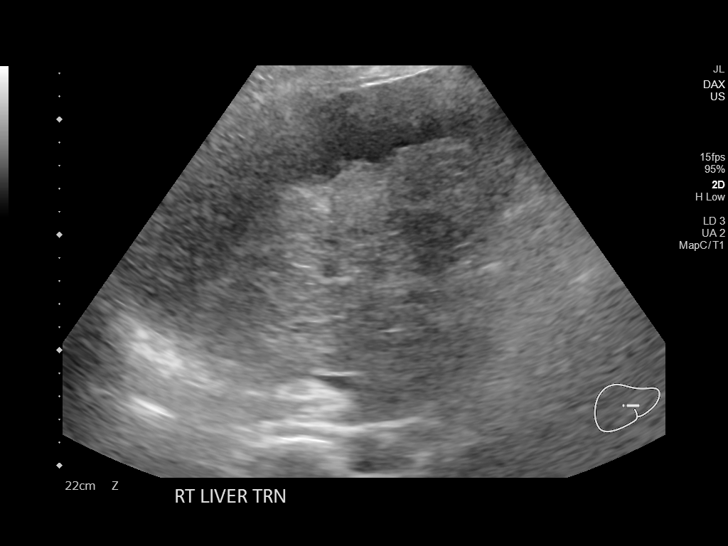
[im 34/74]
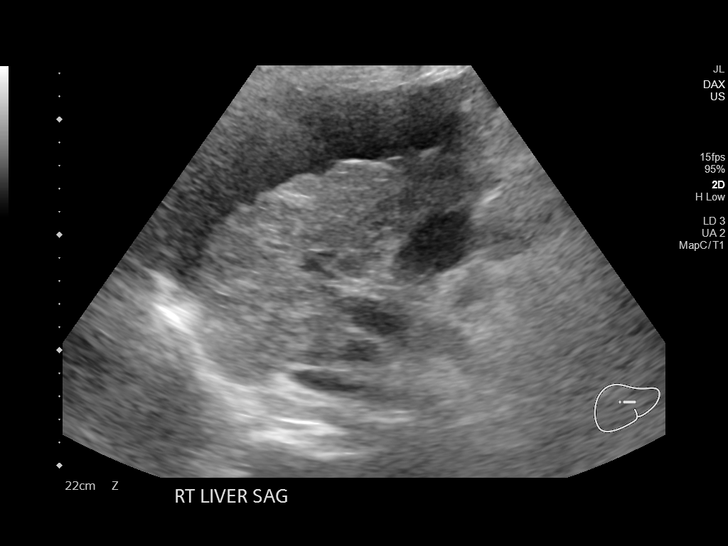
[im 40/74]
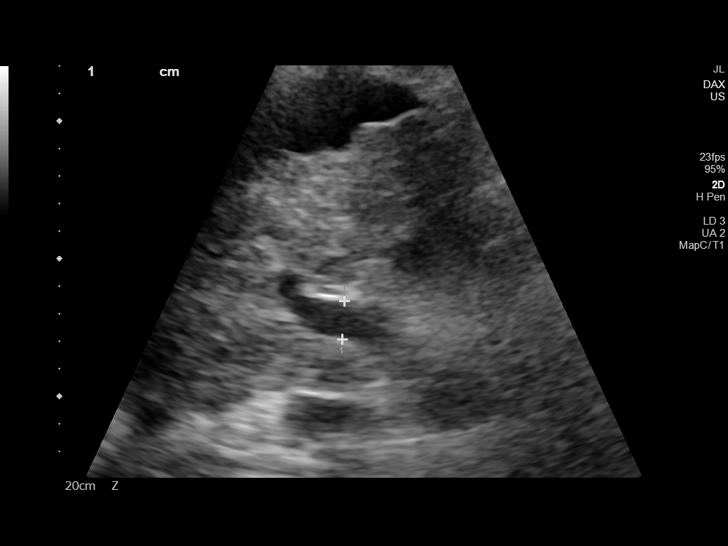
[im 47/74]
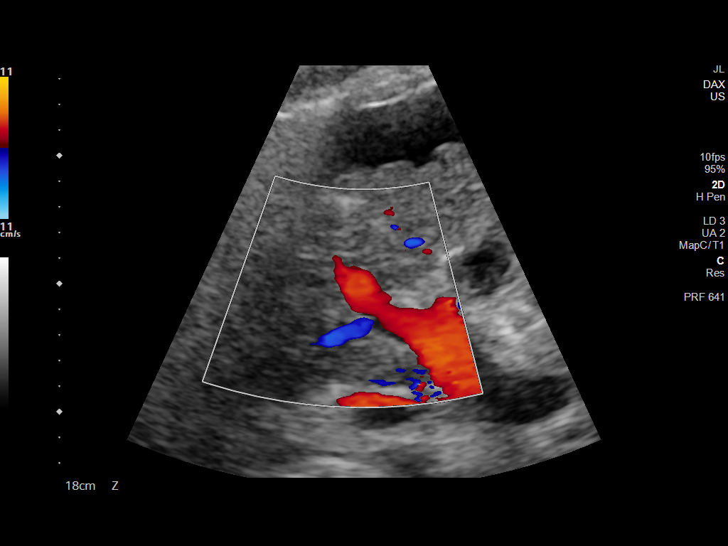
[im 54/74]
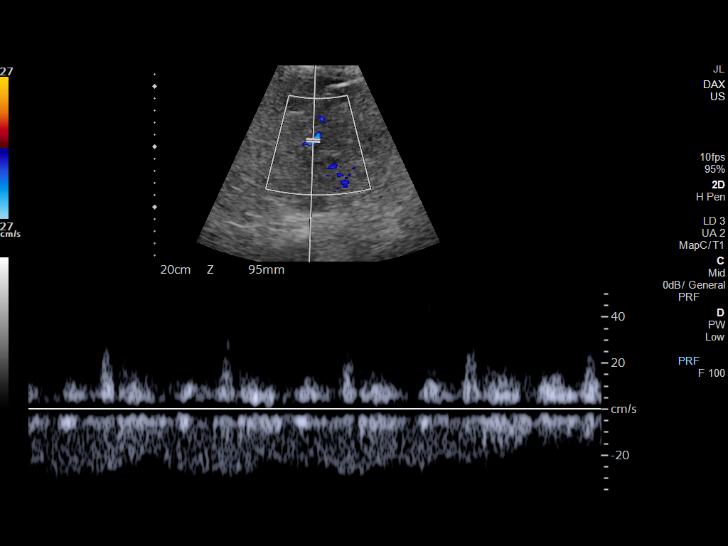
[im 60/74]
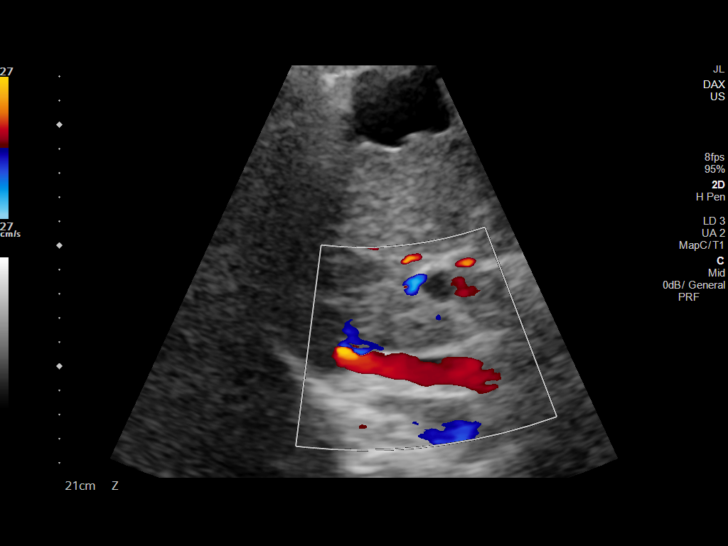
[im 67/74]
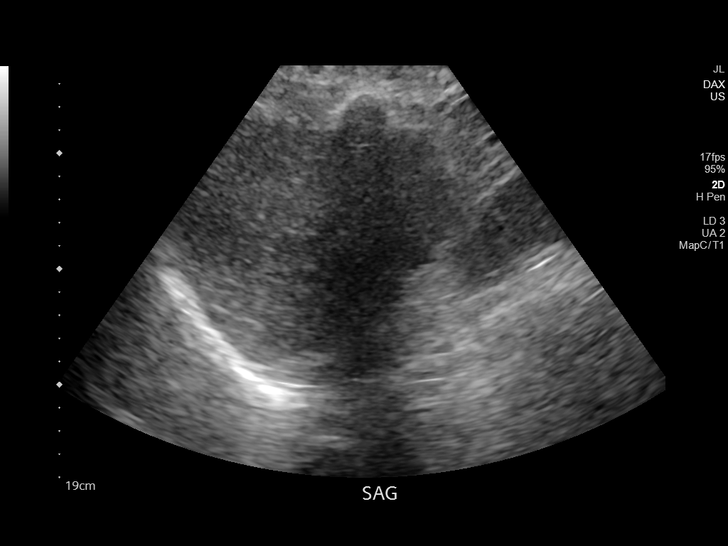
[im 74/74]
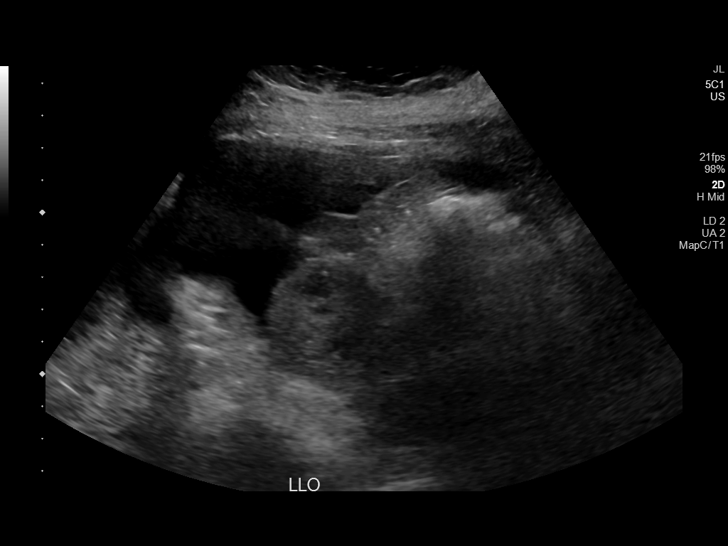

[Series 5: us hepatic liver doppler · 1 of 2 slices shown (2 of 2)]
[im 1/2]
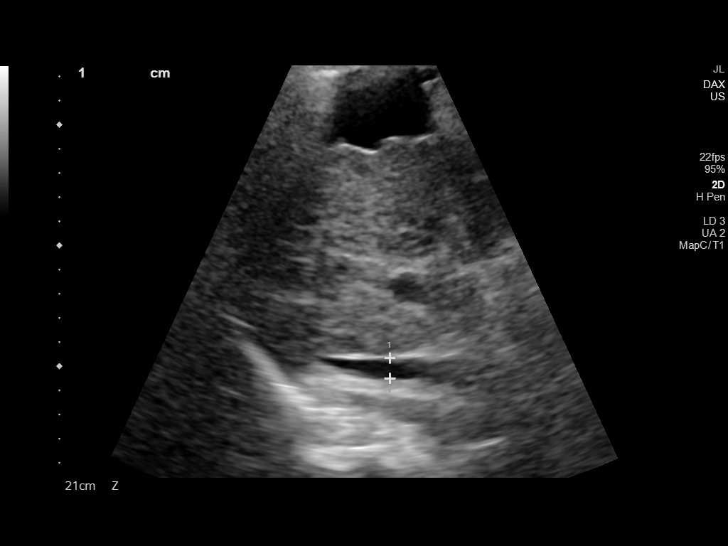

[13 of 25 positions shown; findings below may reference images not displayed]

FINDINGS: Liver: Coarse heterogenous echotexture.  Nodular capsular contour.

No focal lesion, mass or intrahepatic biliary ductal dilatation.

Main Portal Vein size: 1.4 cm

Portal Vein Velocities (all hepatopetal):

Main Prox:  12 cm/sec

Main Mid: 12 cm/sec

Main Dist:  17 cm/sec
Right: 13 cm/sec
Left: 18 cm/sec

Hepatic Vein Velocities (all hepatofugal):

Right:  27 cm/sec

Middle:  24 cm/sec

Left:  29 cm/sec

IVC: Present and patent with normal respiratory phasicity. 58
cm/sec.

Hepatic Artery Velocity: Not visualized

Splenic Vein Velocity:  16 cm/sec

Spleen: 14.1 cm x 7.2 cm x 16.7 cm with a total volume of 889 cm^3
(411 cm^3 is upper limit normal)

Portal Vein Occlusion/Thrombus: None identified, limited evaluation
of left portal venous system.

Splenic Vein Occlusion/Thrombus: No

Ascites: Moderate perihepatic

Varices: None identified

Common bile duct is 2.9 mm thickness, unremarkable

The gallbladder is incompletely distended, measured 1 measured up to
2.6 mm in thickness. 5 mm calculus. No pericholecystic fluid. No
Murphy sign described by technologist.
IMPRESSION: 1. No vascular abnormality on hepatic Doppler evaluation.
2. Hepatic morphologic changes consistent with cirrhosis, with
stigmata of portal venous hypertension.
3. Cholelithiasis without evidence of acute cholecystitis or biliary
obstruction.
